# Patient Record
Sex: Female | Born: 1935
Health system: Southern US, Community
[De-identification: ages and names within clinical notes are randomized; demographics above are authoritative.]

## PROBLEM LIST (undated history)

## (undated) DIAGNOSIS — T7840XA Allergy, unspecified, initial encounter: Secondary | ICD-10-CM

## (undated) DIAGNOSIS — M545 Low back pain, unspecified: Secondary | ICD-10-CM

## (undated) DIAGNOSIS — K219 Gastro-esophageal reflux disease without esophagitis: Secondary | ICD-10-CM

## (undated) DIAGNOSIS — G8929 Other chronic pain: Secondary | ICD-10-CM

## (undated) DIAGNOSIS — I10 Essential (primary) hypertension: Secondary | ICD-10-CM

## (undated) DIAGNOSIS — I4891 Unspecified atrial fibrillation: Secondary | ICD-10-CM

## (undated) DIAGNOSIS — J309 Allergic rhinitis, unspecified: Secondary | ICD-10-CM

## (undated) HISTORY — DX: Allergy, unspecified, initial encounter: T78.40XA

## (undated) HISTORY — DX: Essential (primary) hypertension: I10

## (undated) HISTORY — PX: EYE SURGERY: SHX253

---

## 2011-11-12 DIAGNOSIS — J019 Acute sinusitis, unspecified: Secondary | ICD-10-CM | POA: Diagnosis not present

## 2011-11-12 DIAGNOSIS — J029 Acute pharyngitis, unspecified: Secondary | ICD-10-CM | POA: Diagnosis not present

## 2011-11-14 DIAGNOSIS — J449 Chronic obstructive pulmonary disease, unspecified: Secondary | ICD-10-CM | POA: Diagnosis not present

## 2012-07-11 DIAGNOSIS — H903 Sensorineural hearing loss, bilateral: Secondary | ICD-10-CM | POA: Diagnosis not present

## 2012-09-03 DIAGNOSIS — J449 Chronic obstructive pulmonary disease, unspecified: Secondary | ICD-10-CM | POA: Diagnosis not present

## 2012-09-03 DIAGNOSIS — R079 Chest pain, unspecified: Secondary | ICD-10-CM | POA: Diagnosis not present

## 2012-09-18 DIAGNOSIS — J310 Chronic rhinitis: Secondary | ICD-10-CM | POA: Diagnosis not present

## 2012-09-18 DIAGNOSIS — R05 Cough: Secondary | ICD-10-CM | POA: Diagnosis not present

## 2012-09-18 DIAGNOSIS — S20219A Contusion of unspecified front wall of thorax, initial encounter: Secondary | ICD-10-CM | POA: Diagnosis not present

## 2012-09-18 DIAGNOSIS — J8410 Pulmonary fibrosis, unspecified: Secondary | ICD-10-CM | POA: Diagnosis not present

## 2012-09-19 DIAGNOSIS — I10 Essential (primary) hypertension: Secondary | ICD-10-CM | POA: Diagnosis not present

## 2012-09-29 DIAGNOSIS — J8410 Pulmonary fibrosis, unspecified: Secondary | ICD-10-CM | POA: Diagnosis not present

## 2012-09-29 DIAGNOSIS — I10 Essential (primary) hypertension: Secondary | ICD-10-CM | POA: Diagnosis not present

## 2013-01-26 DIAGNOSIS — I10 Essential (primary) hypertension: Secondary | ICD-10-CM | POA: Diagnosis not present

## 2013-01-26 DIAGNOSIS — J8410 Pulmonary fibrosis, unspecified: Secondary | ICD-10-CM | POA: Diagnosis not present

## 2013-01-26 DIAGNOSIS — J310 Chronic rhinitis: Secondary | ICD-10-CM | POA: Diagnosis not present

## 2013-02-21 ENCOUNTER — Ambulatory Visit: Payer: Self-pay | Admitting: Internal Medicine

## 2013-02-21 DIAGNOSIS — I82819 Embolism and thrombosis of superficial veins of unspecified lower extremities: Secondary | ICD-10-CM | POA: Diagnosis not present

## 2013-02-21 DIAGNOSIS — M7989 Other specified soft tissue disorders: Secondary | ICD-10-CM | POA: Diagnosis not present

## 2013-02-21 DIAGNOSIS — I10 Essential (primary) hypertension: Secondary | ICD-10-CM | POA: Diagnosis not present

## 2013-02-21 DIAGNOSIS — M79609 Pain in unspecified limb: Secondary | ICD-10-CM | POA: Diagnosis not present

## 2013-02-21 DIAGNOSIS — I824Z9 Acute embolism and thrombosis of unspecified deep veins of unspecified distal lower extremity: Secondary | ICD-10-CM | POA: Diagnosis not present

## 2013-03-06 DIAGNOSIS — I809 Phlebitis and thrombophlebitis of unspecified site: Secondary | ICD-10-CM | POA: Diagnosis not present

## 2013-03-06 DIAGNOSIS — S9000XA Contusion of unspecified ankle, initial encounter: Secondary | ICD-10-CM | POA: Diagnosis not present

## 2013-03-06 DIAGNOSIS — I10 Essential (primary) hypertension: Secondary | ICD-10-CM | POA: Diagnosis not present

## 2013-03-06 DIAGNOSIS — Z0000 Encounter for general adult medical examination without abnormal findings: Secondary | ICD-10-CM | POA: Diagnosis not present

## 2013-04-20 DIAGNOSIS — Z01419 Encounter for gynecological examination (general) (routine) without abnormal findings: Secondary | ICD-10-CM | POA: Diagnosis not present

## 2013-04-20 DIAGNOSIS — Z23 Encounter for immunization: Secondary | ICD-10-CM | POA: Diagnosis not present

## 2013-04-20 DIAGNOSIS — Z0000 Encounter for general adult medical examination without abnormal findings: Secondary | ICD-10-CM | POA: Diagnosis not present

## 2013-04-20 DIAGNOSIS — Z9109 Other allergy status, other than to drugs and biological substances: Secondary | ICD-10-CM | POA: Diagnosis not present

## 2013-04-30 DIAGNOSIS — Z0000 Encounter for general adult medical examination without abnormal findings: Secondary | ICD-10-CM | POA: Diagnosis not present

## 2013-11-13 DIAGNOSIS — J018 Other acute sinusitis: Secondary | ICD-10-CM | POA: Diagnosis not present

## 2013-12-04 ENCOUNTER — Emergency Department: Payer: Self-pay | Admitting: Emergency Medicine

## 2013-12-04 DIAGNOSIS — S40012A Contusion of left shoulder, initial encounter: Secondary | ICD-10-CM | POA: Diagnosis not present

## 2013-12-04 DIAGNOSIS — S79912A Unspecified injury of left hip, initial encounter: Secondary | ICD-10-CM | POA: Diagnosis not present

## 2013-12-04 DIAGNOSIS — J189 Pneumonia, unspecified organism: Secondary | ICD-10-CM | POA: Diagnosis not present

## 2013-12-04 DIAGNOSIS — M25552 Pain in left hip: Secondary | ICD-10-CM | POA: Diagnosis not present

## 2013-12-04 DIAGNOSIS — R05 Cough: Secondary | ICD-10-CM | POA: Diagnosis not present

## 2013-12-04 DIAGNOSIS — R918 Other nonspecific abnormal finding of lung field: Secondary | ICD-10-CM | POA: Diagnosis not present

## 2013-12-04 DIAGNOSIS — S4992XA Unspecified injury of left shoulder and upper arm, initial encounter: Secondary | ICD-10-CM | POA: Diagnosis not present

## 2013-12-04 DIAGNOSIS — Z880 Allergy status to penicillin: Secondary | ICD-10-CM | POA: Diagnosis not present

## 2013-12-04 DIAGNOSIS — I10 Essential (primary) hypertension: Secondary | ICD-10-CM | POA: Diagnosis not present

## 2013-12-04 DIAGNOSIS — M25512 Pain in left shoulder: Secondary | ICD-10-CM | POA: Diagnosis not present

## 2013-12-04 DIAGNOSIS — S7002XA Contusion of left hip, initial encounter: Secondary | ICD-10-CM | POA: Diagnosis not present

## 2013-12-14 ENCOUNTER — Ambulatory Visit: Payer: Self-pay | Admitting: Family Medicine

## 2013-12-14 DIAGNOSIS — Z9109 Other allergy status, other than to drugs and biological substances: Secondary | ICD-10-CM | POA: Diagnosis not present

## 2013-12-14 DIAGNOSIS — M25552 Pain in left hip: Secondary | ICD-10-CM | POA: Diagnosis not present

## 2013-12-14 DIAGNOSIS — I10 Essential (primary) hypertension: Secondary | ICD-10-CM | POA: Diagnosis not present

## 2013-12-14 DIAGNOSIS — J189 Pneumonia, unspecified organism: Secondary | ICD-10-CM | POA: Diagnosis not present

## 2013-12-14 DIAGNOSIS — J159 Unspecified bacterial pneumonia: Secondary | ICD-10-CM | POA: Diagnosis not present

## 2013-12-14 DIAGNOSIS — R399 Unspecified symptoms and signs involving the genitourinary system: Secondary | ICD-10-CM | POA: Diagnosis not present

## 2013-12-15 ENCOUNTER — Ambulatory Visit: Payer: Self-pay | Admitting: Family Medicine

## 2013-12-15 DIAGNOSIS — J159 Unspecified bacterial pneumonia: Secondary | ICD-10-CM | POA: Diagnosis not present

## 2013-12-15 DIAGNOSIS — R05 Cough: Secondary | ICD-10-CM | POA: Diagnosis not present

## 2013-12-15 DIAGNOSIS — J189 Pneumonia, unspecified organism: Secondary | ICD-10-CM | POA: Diagnosis not present

## 2013-12-30 ENCOUNTER — Ambulatory Visit: Payer: Self-pay | Admitting: Family Medicine

## 2013-12-30 DIAGNOSIS — J189 Pneumonia, unspecified organism: Secondary | ICD-10-CM | POA: Diagnosis not present

## 2013-12-30 DIAGNOSIS — Z9181 History of falling: Secondary | ICD-10-CM | POA: Diagnosis not present

## 2013-12-30 DIAGNOSIS — Z1389 Encounter for screening for other disorder: Secondary | ICD-10-CM | POA: Diagnosis not present

## 2013-12-30 DIAGNOSIS — J984 Other disorders of lung: Secondary | ICD-10-CM | POA: Diagnosis not present

## 2013-12-30 DIAGNOSIS — M25552 Pain in left hip: Secondary | ICD-10-CM | POA: Diagnosis not present

## 2013-12-30 DIAGNOSIS — R918 Other nonspecific abnormal finding of lung field: Secondary | ICD-10-CM | POA: Diagnosis not present

## 2013-12-31 ENCOUNTER — Ambulatory Visit: Payer: Self-pay | Admitting: Family Medicine

## 2013-12-31 DIAGNOSIS — J984 Other disorders of lung: Secondary | ICD-10-CM | POA: Diagnosis not present

## 2013-12-31 DIAGNOSIS — K8020 Calculus of gallbladder without cholecystitis without obstruction: Secondary | ICD-10-CM | POA: Diagnosis not present

## 2013-12-31 DIAGNOSIS — I2510 Atherosclerotic heart disease of native coronary artery without angina pectoris: Secondary | ICD-10-CM | POA: Diagnosis not present

## 2014-05-25 DIAGNOSIS — J302 Other seasonal allergic rhinitis: Secondary | ICD-10-CM | POA: Diagnosis not present

## 2014-05-25 DIAGNOSIS — J209 Acute bronchitis, unspecified: Secondary | ICD-10-CM | POA: Diagnosis not present

## 2014-06-04 ENCOUNTER — Ambulatory Visit (INDEPENDENT_AMBULATORY_CARE_PROVIDER_SITE_OTHER): Payer: Medicare Other | Admitting: Family Medicine

## 2014-06-04 ENCOUNTER — Ambulatory Visit
Admission: RE | Admit: 2014-06-04 | Discharge: 2014-06-04 | Disposition: A | Discharge status: Home / Self Care | Payer: Medicare Other | Source: Ambulatory Visit | Attending: Family Medicine | Admitting: Family Medicine

## 2014-06-04 ENCOUNTER — Encounter: Payer: Self-pay | Admitting: Family Medicine

## 2014-06-04 ENCOUNTER — Other Ambulatory Visit: Payer: Self-pay | Admitting: Family Medicine

## 2014-06-04 VITALS — BP 140/74 | HR 65 | Temp 97.9°F | Resp 16 | Ht 67.0 in | Wt 127.2 lb

## 2014-06-04 DIAGNOSIS — R0602 Shortness of breath: Secondary | ICD-10-CM

## 2014-06-04 DIAGNOSIS — R05 Cough: Secondary | ICD-10-CM | POA: Insufficient documentation

## 2014-06-04 DIAGNOSIS — J209 Acute bronchitis, unspecified: Secondary | ICD-10-CM

## 2014-06-04 MED ORDER — LEVOFLOXACIN 500 MG PO TABS: 500.0000 mg | ORAL_TABLET | Freq: Every day | ORAL | Status: DC

## 2014-06-04 MED ORDER — PREDNISONE 20 MG PO TABS: 20.0000 mg | ORAL_TABLET | Freq: Every day | ORAL | Status: AC

## 2014-06-04 NOTE — Progress Notes (Signed)
This encounter was created in error - please disregard. This encounter was created in error - please disregard. This encounter was created in error - please disregard. 

## 2014-06-04 NOTE — Patient Instructions (Addendum)
Let's try a short course of steroids and levaquin for your symptoms. Your chest X-ray shows no pneumonia.   Please seek immediate medical attention if you develop shortness of breath not relieve by inhaler, chest pain/tightness, fever > 103 F or other concerning symptoms.  Acute Bronchitis Bronchitis is inflammation of the airways that extend from the windpipe into the lungs (bronchi). The inflammation often causes mucus to develop. This leads to a cough, which is the most common symptom of bronchitis.  In acute bronchitis, the condition usually develops suddenly and goes away over time, usually in a couple weeks. Smoking, allergies, and asthma can make bronchitis worse. Repeated episodes of bronchitis may cause further lung problems.  CAUSES Acute bronchitis is most often caused by the same virus that causes a cold. The virus can spread from person to person (contagious) through coughing, sneezing, and touching contaminated objects. SIGNS AND SYMPTOMS   Cough.   Fever.   Coughing up mucus.   Body aches.   Chest congestion.   Chills.   Shortness of breath.   Sore throat.  DIAGNOSIS  Acute bronchitis is usually diagnosed through a physical exam. Your health care provider will also ask you questions about your medical history. Tests, such as chest X-rays, are sometimes done to rule out other conditions.  TREATMENT  Acute bronchitis usually goes away in a couple weeks. Oftentimes, no medical treatment is necessary. Medicines are sometimes given for relief of fever or cough. Antibiotic medicines are usually not needed but may be prescribed in certain situations. In some cases, an inhaler may be recommended to help reduce shortness of breath and control the cough. A cool mist vaporizer may also be used to help thin bronchial secretions and make it easier to clear the chest.  HOME CARE INSTRUCTIONS  Get plenty of rest.   Drink enough fluids to keep your urine clear or pale yellow  (unless you have a medical condition that requires fluid restriction). Increasing fluids may help thin your respiratory secretions (sputum) and reduce chest congestion, and it will prevent dehydration.   Take medicines only as directed by your health care provider.  If you were prescribed an antibiotic medicine, finish it all even if you start to feel better.  Avoid smoking and secondhand smoke. Exposure to cigarette smoke or irritating chemicals will make bronchitis worse. If you are a smoker, consider using nicotine gum or skin patches to help control withdrawal symptoms. Quitting smoking will help your lungs heal faster.   Reduce the chances of another bout of acute bronchitis by washing your hands frequently, avoiding people with cold symptoms, and trying not to touch your hands to your mouth, nose, or eyes.   Keep all follow-up visits as directed by your health care provider.  SEEK MEDICAL CARE IF: Your symptoms do not improve after 1 week of treatment.  SEEK IMMEDIATE MEDICAL CARE IF:  You develop an increased fever or chills.   You have chest pain.   You have severe shortness of breath.  You have bloody sputum.   You develop dehydration.  You faint or repeatedly feel like you are going to pass out.  You develop repeated vomiting.  You develop a severe headache. MAKE SURE YOU:   Understand these instructions.  Will watch your condition.  Will get help right away if you are not doing well or get worse. Document Released: 01/26/2004 Document Revised: 05/04/2013 Document Reviewed: 06/10/2012 [facility] Patient Information 2015 [place], [place]. This information is not intended to replace  advice given to you by your health care provider. Make sure you discuss any questions you have with your health care provider.  

## 2014-06-04 NOTE — Progress Notes (Signed)
Subjective:    Patient ID: Leslie Johnston, female    DOB: 16-May-1925, 81 y.o.   MRN: [id number]  HPI: Leslie Johnston is a 81 y.o. female presenting on 06/04/2014 for Cough   Cough This is a recurrent problem. The current episode started 1 to 4 weeks ago. The problem has been gradually improving. The problem occurs every few hours. The cough is productive of sputum. Associated symptoms include nasal congestion, postnasal drip, shortness of breath and wheezing. Pertinent negatives include no chest pain, chills, fever, headaches or sore throat. The symptoms are aggravated by lying down and exercise. She has tried a beta-agonist inhaler for the symptoms. The treatment provided mild relief. Her past medical history is significant for bronchitis and pneumonia.  Pt was seen last week and treated with a Z-pak. Symptoms have failed to improved. She endorses thick green sputum production and audible wheezing. She is using ventolin inhaler 4 times daily with little relief.   Past Medical History  Diagnosis Date  . Allergy   . Hypertension     No current outpatient prescriptions on file prior to visit.   No current facility-administered medications on file prior to visit.    Review of Systems  Constitutional: Negative for fever and chills.  HENT: Positive for postnasal drip and sinus pressure. Negative for congestion and sore throat.   Eyes: Negative.   Respiratory: Positive for cough (with sputum production.), shortness of breath and wheezing.   Cardiovascular: Negative for chest pain, palpitations and leg swelling.  Genitourinary: Negative.   Musculoskeletal: Negative.   Neurological: Negative.  Negative for dizziness, light-headedness and headaches.   Per HPI unless specifically indicated above     Objective:    BP 140/74 mmHg  Pulse 65  Temp(Src) 97.9 F (36.6 C) (Oral)  Resp 16  Ht 5\' 7"  (1.702 m)  Wt 127 lb 3.2 oz (57.698 kg)  BMI 19.92 kg/m2  SpO2 96%  Wt Readings from  Last 3 Encounters:  06/04/14 127 lb 3.2 oz (57.698 kg)    Physical Exam  Constitutional: She appears well-developed and well-nourished. No distress.  HENT:  Head: Normocephalic.  Right Ear: Tympanic membrane is not erythematous and not bulging.  Left Ear: Tympanic membrane is not erythematous and not bulging.  Nose: Rhinorrhea present. No mucosal edema or sinus tenderness. Right sinus exhibits no maxillary sinus tenderness and no frontal sinus tenderness. Left sinus exhibits no maxillary sinus tenderness and no frontal sinus tenderness.  Mouth/Throat: Uvula is midline and mucous membranes are normal. No uvula swelling. No posterior oropharyngeal edema or posterior oropharyngeal erythema.  Neck: Neck supple. No Brudzinski's sign and no Kernig's sign noted.  Cardiovascular: Normal rate, regular rhythm, S1 normal, S2 normal and normal heart sounds.  Exam reveals no gallop.   No murmur heard. Pulmonary/Chest: No accessory muscle usage. No tachypnea. No respiratory distress. She has wheezes in the right upper field, the right lower field, the left upper field and the left lower field. She has no rhonchi. She has no rales.  Lymphadenopathy:    She has cervical adenopathy.   No results found for this or any previous visit.    Assessment & Plan:   Problem List Items Addressed This Visit    None    Visit Diagnoses    Acute bronchitis, unspecified organism    -  Primary    Failed to improve with previous treatment. Start low dose steroids x 5 days. Trial of levaquin.    Relevant Medications  levofloxacin (LEVAQUIN) 500 MG tablet    predniSONE (DELTASONE) 20 MG tablet    Shortness of breath at rest        Continue inhalers as needed.       Meds ordered this encounter  Medications  . VENTOLIN HFA 108 (90 BASE) MCG/ACT inhaler    Sig:     Refill:  10  . fluticasone (FLONASE) 50 MCG/ACT nasal spray    Sig:     Refill:  11  . aspirin 81 MG tablet    Sig: Take 81 mg by mouth daily.  Marland Kitchen  loratadine (CLARITIN) 10 MG tablet    Sig: Take 10 mg by mouth daily.  Marland Kitchen levofloxacin (LEVAQUIN) 500 MG tablet    Sig: Take 1 tablet (500 mg total) by mouth daily.    Dispense:  7 tablet    Refill:  0    Order Specific Question:  Supervising Provider    Answer:  Arlis Porta [[id number]]  . predniSONE (DELTASONE) 20 MG tablet    Sig: Take 1 tablet (20 mg total) by mouth daily with breakfast.    Dispense:  5 tablet    Refill:  0    Order Specific Question:  Supervising Provider    Answer:  Arlis Porta [[id number]]      Follow up plan: Return if symptoms worsen or fail to improve.

## 2014-07-27 ENCOUNTER — Encounter: Payer: Self-pay | Admitting: Family Medicine

## 2014-07-27 ENCOUNTER — Ambulatory Visit (INDEPENDENT_AMBULATORY_CARE_PROVIDER_SITE_OTHER): Payer: Medicare Other | Admitting: Family Medicine

## 2014-07-27 VITALS — BP 148/84 | HR 64 | Temp 98.2°F | Resp 16 | Ht 67.0 in | Wt 127.0 lb

## 2014-07-27 DIAGNOSIS — I129 Hypertensive chronic kidney disease with stage 1 through stage 4 chronic kidney disease, or unspecified chronic kidney disease: Secondary | ICD-10-CM | POA: Insufficient documentation

## 2014-07-27 DIAGNOSIS — N183 Chronic kidney disease, stage 3 unspecified: Secondary | ICD-10-CM | POA: Insufficient documentation

## 2014-07-27 DIAGNOSIS — Z0000 Encounter for general adult medical examination without abnormal findings: Secondary | ICD-10-CM

## 2014-07-27 DIAGNOSIS — I10 Essential (primary) hypertension: Secondary | ICD-10-CM | POA: Insufficient documentation

## 2014-07-27 NOTE — Patient Instructions (Signed)
Overall doing well. Does not want to screen for osteoporosis, breast cancer or colon cancer.   Would like some help from family around the house doing dishes. :)

## 2014-07-27 NOTE — Progress Notes (Signed)
Subjective:    Leslie Johnston is a 81 y.o. female who presents for Medicare Annual/Subsequent preventive examination.  Preventive Screening-Counseling & Management  Tobacco History  Smoking status  . Never Smoker   Smokeless tobacco  . Not on file     Problems Prior to Visit 1.   Current Problems (verified) Patient Active Problem List   Diagnosis Date Noted  . HTN (hypertension) 07/27/2014    Medications Prior to Visit Current Outpatient Prescriptions on File Prior to Visit  Medication Sig Dispense Refill  . aspirin 81 MG tablet Take 81 mg by mouth daily.    . fluticasone (FLONASE) 50 MCG/ACT nasal spray   11  . loratadine (CLARITIN) 10 MG tablet Take 10 mg by mouth daily.    . VENTOLIN HFA 108 (90 BASE) MCG/ACT inhaler   10   No current facility-administered medications on file prior to visit.    Current Medications (verified) Current Outpatient Prescriptions  Medication Sig Dispense Refill  . aspirin 81 MG tablet Take 81 mg by mouth daily.    . fluticasone (FLONASE) 50 MCG/ACT nasal spray   11  . loratadine (CLARITIN) 10 MG tablet Take 10 mg by mouth daily.    . VENTOLIN HFA 108 (90 BASE) MCG/ACT inhaler   10   No current facility-administered medications for this visit.     Allergies (verified) Amlodipine; Losartan; and Penicillins   PAST HISTORY  Family History Family History  Problem Relation Age of Onset  . Cancer Sister     pancreatic CA at 71    Social History History  Substance Use Topics  . Smoking status: Never Smoker   . Smokeless tobacco: Not on file  . Alcohol Use: Not on file     Are there smokers in your home (other than you)? No  Risk Factors Current exercise habits: Home exercise routine includes walking 15 minutes per day.  Dietary issues discussed: None.    Cardiac risk factors: advanced age (older than 88 for men, 64 for women) and hypertension.  Depression Screen (Note: if answer to either of the following is  "Yes", a more complete depression screening is indicated)   Over the past two weeks, have you felt down, depressed or hopeless? No  Over the past two weeks, have you felt little interest or pleasure in doing things? No  Have you lost interest or pleasure in daily life? No  Do you often feel hopeless? No  Do you cry easily over simple problems? No  Activities of Daily Living In your present state of health, do you have any difficulty performing the following activities?:  Driving? No Managing money?  No Feeding yourself? No Getting from bed to chair? No Climbing a flight of stairs? NO Preparing food and eating?: NO Bathing or showering? NO Getting dressed:NO Getting to the toilet?NO Using the toilet:NO Moving around from place to place: NO In the past year have you fallen or had a near fall? Yes  Are you sexually active?  NO  Do you have more than one partner?NO  Hearing Difficulties: Yes- pt has bilateral hearing aids.  Do you often ask people to speak up or repeat themselves? Yes without hearing aid.  Do you experience ringing or noises in your ears?NO Do you have difficulty understanding soft or whispered voices?NO   Do you feel that you have a problem with memory? NO  Do you often misplace items?NO  Do you feel safe at home? NO  Cognitive Testing  Alert? Yes  Normal Appearance? Yes  Oriented to person? Yes Place? Yes Time? Yes  Recall of three objects? Yes  Can perform simple calculations?Yes  Displays appropriate judgment?Yes  Can read the correct time from a watch face? Yes   Advanced Directives have been discussed with the patient? Yes MMSE - Mini Mental State Exam 07/27/2014  Orientation to time 5  Orientation to Place 5  Registration 3  Attention/ Calculation 5  Recall 3  Language- name 2 objects 2  Language- repeat 1  Language- follow 3 step command 3  Language- read & follow direction 1  Write a sentence 1  Copy design 1  Total score 30      List  the Names of Other Physician/Practitioners you currently use: 1.  Fredia Sorrow, FNP  Indicate any recent Medical Services you may have received from other than [facility] providers in the past year (date may be approximate).  Immunization History  Administered Date(s) Administered  . Pneumococcal Conjugate-13 04/25/2013    Screening Tests Health Maintenance  Topic Date Due  . PNA vac Low Risk Adult (2 of 2 - PPSV23) 04/26/2014  . INFLUENZA VACCINE  08/02/2014  . TETANUS/TDAP  01/01/2021  . COLONOSCOPY  07/26/2024  . DEXA SCAN  Completed  . ZOSTAVAX  Completed     History reviewed: allergies, current medications, past medical history, past social history, past surgical history and problem list  Review of Systems A comprehensive review of systems was negative.    Objective:     Vision by Snellen chart: right eye:20/40, left eye:pt is blind in L eye  Body mass index is 19.89 kg/(m^2). BP 148/84 mmHg  Pulse 64  Temp(Src) 98.2 F (36.8 C) (Oral)  Resp 16  Ht 5\' 7"  (1.702 m)  Wt 127 lb (57.607 kg)  BMI 19.89 kg/m2       Assessment:     Medicare Wellness visit      Plan:     During the course of the visit the patient was educated and counseled about appropriate screening and preventive services including:    Screening mammography  Nutrition counseling   Advanced directives: has NO advanced directive  - add't info requested. Referral to SW: no  Diet review for nutrition referral? Yes ____  Not Indicated ____   Patient Instructions (the written plan) was given to the patient.  Medicare Attestation I have personally reviewed: The patient's medical and social history Their use of alcohol, tobacco or illicit drugs Their current medications and supplements The patient's functional ability including ADLs,fall risks, home safety risks, cognitive, and hearing and visual impairment Diet and physical activities Evidence for depression or mood disorders  The patient's  weight, height, BMI, and visual acuity have been recorded in the chart.  I have made referrals, counseling, and provided education to the patient based on review of the above and I have provided the patient with a written personalized care plan for preventive services.     Ilyse Tremain L Jullia Mulligan, NP   07/27/2014

## 2014-11-16 ENCOUNTER — Encounter: Payer: Self-pay | Admitting: Family Medicine

## 2014-11-16 ENCOUNTER — Ambulatory Visit (INDEPENDENT_AMBULATORY_CARE_PROVIDER_SITE_OTHER): Payer: Medicare Other | Admitting: Family Medicine

## 2014-11-16 VITALS — BP 134/82 | HR 92 | Temp 98.2°F | Resp 16 | Ht 67.0 in | Wt 123.0 lb

## 2014-11-16 DIAGNOSIS — J069 Acute upper respiratory infection, unspecified: Secondary | ICD-10-CM | POA: Diagnosis not present

## 2014-11-16 DIAGNOSIS — J0110 Acute frontal sinusitis, unspecified: Secondary | ICD-10-CM

## 2014-11-16 MED ORDER — DOXYCYCLINE HYCLATE 100 MG PO TABS
100.0000 mg | ORAL_TABLET | Freq: Two times a day (BID) | ORAL | Status: DC
Start: 2014-11-16 — End: 2015-01-07

## 2014-11-16 MED ORDER — DM-GUAIFENESIN ER 30-600 MG PO TB12: 1.0000 | ORAL_TABLET | Freq: Two times a day (BID) | ORAL | Status: DC

## 2014-11-16 MED ORDER — OXYMETAZOLINE HCL 0.05 % NA SOLN: 1.0000 | Freq: Two times a day (BID) | NASAL | Status: DC

## 2014-11-16 NOTE — Progress Notes (Signed)
Subjective:    Patient ID: Leslie Johnston, female    DOB: 19-Apr-1925, 81 y.o.   MRN: [id number]  HPI: Leslie Johnston is a 81 y.o. female presenting on 11/16/2014 for Cough   HPI  Pt presents cough and congestion.  She is reporting nasal drainage and ear congestion.  Symptoms have been present for about 5 days. Home treatment: Aspirin and flonase.  She is also taking claritin OTC for allergies. Took advil this morning. Pt is reporting R sided facial pain. Unsure if she had a fever at home.    Past Medical History  Diagnosis Date  . Allergy   . Hypertension     Current Outpatient Prescriptions on File Prior to Visit  Medication Sig  . aspirin 81 MG tablet Take 81 mg by mouth daily.  . fluticasone (FLONASE) 50 MCG/ACT nasal spray   . loratadine (CLARITIN) 10 MG tablet Take 10 mg by mouth daily.  . VENTOLIN HFA 108 (90 BASE) MCG/ACT inhaler    No current facility-administered medications on file prior to visit.    Review of Systems  Constitutional: Positive for chills. Negative for fever.  HENT: Positive for congestion and sinus pressure. Negative for ear pain, sneezing and sore throat.   Respiratory: Negative for cough, chest tightness and wheezing.   Cardiovascular: Negative for chest pain and palpitations.  Gastrointestinal: Negative.  Negative for nausea, vomiting and diarrhea.  Musculoskeletal: Negative for neck pain and neck stiffness.  Neurological: Positive for headaches.   Per HPI unless specifically indicated above     Objective:    BP 134/82 mmHg  Pulse 92  Temp(Src) 98.2 F (36.8 C) (Oral)  Resp 16  Ht 5\' 7"  (1.702 m)  Wt 123 lb (55.792 kg)  BMI 19.26 kg/m2  SpO2 96%  Wt Readings from Last 3 Encounters:  11/16/14 123 lb (55.792 kg)  07/27/14 127 lb (57.607 kg)  06/04/14 127 lb 3.2 oz (57.698 kg)    Physical Exam  Constitutional: She appears well-developed and well-nourished. No distress.  HENT:  Head: Normocephalic.  Right Ear: Hearing  normal. Tympanic membrane is retracted. Tympanic membrane is not erythematous and not bulging.  Left Ear: Hearing normal. Tympanic membrane is not erythematous and not bulging.  Nose: Mucosal edema and rhinorrhea present. No sinus tenderness or nasal septal hematoma. Right sinus exhibits frontal sinus tenderness. Right sinus exhibits no maxillary sinus tenderness. Left sinus exhibits no maxillary sinus tenderness and no frontal sinus tenderness.  Mouth/Throat: Uvula is midline and mucous membranes are normal. No uvula swelling. Posterior oropharyngeal erythema present. No posterior oropharyngeal edema.  Neck: Neck supple. No Brudzinski's sign and no Kernig's sign noted.  Cardiovascular: Normal rate, regular rhythm and normal heart sounds.   Pulmonary/Chest: Breath sounds normal. No accessory muscle usage. No tachypnea. No respiratory distress.  Lymphadenopathy:    She has no cervical adenopathy.   No results found for this or any previous visit.    Assessment & Plan:   Problem List Items Addressed This Visit    None    Visit Diagnoses    Acute frontal sinusitis, recurrence not specified    -  Primary    Treat with doxycycline for sinus infection. Saline and spray and sparing use of Afrin for ear congestion. RTC if symptoms not improved.     Relevant Medications    doxycycline (VIBRA-TABS) 100 MG tablet    dextromethorphan-guaiFENesin (MUCINEX DM) 30-600 MG 12hr tablet    oxymetazoline (AFRIN NASAL SPRAY) 0.05 % nasal spray  Upper respiratory infection        Supportive care at home. Ventolin as needed for chest tightness. Mucinex Dm for cough and congestion. Alarm symptoms reviewed. RTC if symptoms not improved.     Relevant Medications    dextromethorphan-guaiFENesin (MUCINEX DM) 30-600 MG 12hr tablet       Meds ordered this encounter  Medications  . doxycycline (VIBRA-TABS) 100 MG tablet    Sig: Take 1 tablet (100 mg total) by mouth 2 (two) times daily.    Dispense:  14 tablet      Refill:  0    Order Specific Question:  Supervising Provider    Answer:  Arlis Porta [phone]  . dextromethorphan-guaiFENesin (MUCINEX DM) 30-600 MG 12hr tablet    Sig: Take 1 tablet by mouth 2 (two) times daily.    Dispense:  10 tablet    Refill:  0    Order Specific Question:  Supervising Provider    Answer:  Arlis Porta [phone]  . oxymetazoline (AFRIN NASAL SPRAY) 0.05 % nasal spray    Sig: Place 1 spray into both nostrils 2 (two) times daily.    Dispense:  30 mL    Refill:  0    Order Specific Question:  Supervising Provider    Answer:  Arlis Porta [id number]      Follow up plan: No Follow-up on file.

## 2014-11-16 NOTE — Patient Instructions (Signed)
We are treating you for a sinus infection today.  Take your antibiotic twice daily for 7 days. You can use Afrin to help with congestion twice daily for 3 days only.  You can also use saline spray as needed.  Continue the ventolin inhaler as needed for chest tightness. Use mucinex to help break up congestion. Continue your flonase and claritin.   Please seek immediate medical attention if you develop shortness of breath not relieve by inhaler, chest pain/tightness, fever > 103 F or other concerning symptoms.

## 2015-01-07 ENCOUNTER — Ambulatory Visit
Admission: RE | Admit: 2015-01-07 | Discharge: 2015-01-07 | Disposition: A | Discharge status: Home / Self Care | Payer: Medicare Other | Source: Ambulatory Visit | Attending: Family Medicine | Admitting: Family Medicine

## 2015-01-07 ENCOUNTER — Ambulatory Visit (INDEPENDENT_AMBULATORY_CARE_PROVIDER_SITE_OTHER): Payer: Medicare Other | Admitting: Family Medicine

## 2015-01-07 VITALS — BP 144/76 | HR 109 | Temp 98.9°F | Resp 16 | Ht 67.0 in | Wt 124.0 lb

## 2015-01-07 DIAGNOSIS — Z8701 Personal history of pneumonia (recurrent): Secondary | ICD-10-CM

## 2015-01-07 DIAGNOSIS — J209 Acute bronchitis, unspecified: Secondary | ICD-10-CM | POA: Diagnosis not present

## 2015-01-07 DIAGNOSIS — J449 Chronic obstructive pulmonary disease, unspecified: Secondary | ICD-10-CM | POA: Diagnosis not present

## 2015-01-07 DIAGNOSIS — J069 Acute upper respiratory infection, unspecified: Secondary | ICD-10-CM | POA: Diagnosis not present

## 2015-01-07 DIAGNOSIS — R05 Cough: Secondary | ICD-10-CM | POA: Diagnosis not present

## 2015-01-07 MED ORDER — BENZONATATE 100 MG PO CAPS: 100.0000 mg | ORAL_CAPSULE | Freq: Three times a day (TID) | ORAL | Status: DC | PRN

## 2015-01-07 MED ORDER — DOXYCYCLINE HYCLATE 100 MG PO TABS: 100.0000 mg | ORAL_TABLET | Freq: Two times a day (BID) | ORAL | Status: DC

## 2015-01-07 MED ORDER — DM-GUAIFENESIN ER 30-600 MG PO TB12
1.0000 | ORAL_TABLET | Freq: Two times a day (BID) | ORAL | Status: DC
Start: 2015-01-07 — End: 2015-09-19

## 2015-01-07 NOTE — Patient Instructions (Signed)
You can use supportive care at home to help with your symptoms. I have sent Mucinex DM to your pharmacy to help break up the congestion and soothe your cough. You can takes this twice daily.  I have also sent tesslon perles to your pharmacy to help with the cough- you can take these 3 times daily as needed. Honey is a natural cough suppressant- so add it to your tea in the morning.  If you have a humidifer, set that up in your bedroom at night.   Please seek immediate medical attention if you develop shortness of breath not relieve by inhaler, chest pain/tightness, fever > 103 F or other concerning symptoms.   

## 2015-01-07 NOTE — Progress Notes (Signed)
Subjective:    Patient ID: Leslie Johnston, female    DOB: August 31, 1925, 82 y.o.   MRN: [id number]  HPI: Leslie Johnston is a 82 y.o. female presenting on 01/07/2015 for Cough   HPI  Pt presents for cough, cold, congestion. Symptoms began on Monday. Low grade fevers with cough. Productive cough with green phlegm.  Chest tight. Sore throat. Ears hurt.  Congestion pain pressure in her face.  Home treatment: Cordicin HBP at home.   Past Medical History  Diagnosis Date  . Allergy   . Hypertension     Current Outpatient Prescriptions on File Prior to Visit  Medication Sig  . aspirin 81 MG tablet Take 81 mg by mouth daily.  . fluticasone (FLONASE) 50 MCG/ACT nasal spray   . loratadine (CLARITIN) 10 MG tablet Take 10 mg by mouth daily.  Marland Kitchen oxymetazoline (AFRIN NASAL SPRAY) 0.05 % nasal spray Place 1 spray into both nostrils 2 (two) times daily.  . VENTOLIN HFA 108 (90 BASE) MCG/ACT inhaler    No current facility-administered medications on file prior to visit.    Review of Systems  Constitutional: Positive for fever and chills.  HENT: Positive for congestion, ear pain and sinus pressure. Negative for sneezing and sore throat.   Respiratory: Positive for cough and chest tightness. Negative for wheezing.   Cardiovascular: Negative for chest pain and palpitations.  Gastrointestinal: Negative.  Negative for nausea, vomiting and diarrhea.  Musculoskeletal: Negative for neck pain and neck stiffness.  Neurological: Positive for headaches.   Per HPI unless specifically indicated above     Objective:    BP 144/76 mmHg  Pulse 109  Temp(Src) 98.9 F (37.2 C) (Oral)  Resp 16  Ht 5\' 7"  (1.702 m)  Wt 124 lb (56.246 kg)  BMI 19.42 kg/m2  SpO2 94%  Wt Readings from Last 3 Encounters:  01/07/15 124 lb (56.246 kg)  11/16/14 123 lb (55.792 kg)  07/27/14 127 lb (57.607 kg)    Physical Exam  Constitutional: She appears well-developed and well-nourished. No distress.  HENT:  Head:  Normocephalic and atraumatic.  Right Ear: Hearing and tympanic membrane normal. Tympanic membrane is not erythematous and not bulging.  Left Ear: Hearing and tympanic membrane normal. Tympanic membrane is not erythematous and not bulging.  Nose: Mucosal edema and rhinorrhea present. No sinus tenderness. Right sinus exhibits no maxillary sinus tenderness and no frontal sinus tenderness. Left sinus exhibits maxillary sinus tenderness and frontal sinus tenderness.  Mouth/Throat: Uvula is midline and mucous membranes are normal. No uvula swelling. Posterior oropharyngeal erythema present. No posterior oropharyngeal edema.  Neck: Neck supple. No Brudzinski's sign and no Kernig's sign noted.  Cardiovascular: Normal rate, regular rhythm and normal heart sounds.   Pulmonary/Chest: No accessory muscle usage. No tachypnea. No respiratory distress. She has wheezes in the right lower field and the left lower field. She has no rhonchi. She has no rales.  Lymphadenopathy:    She has no cervical adenopathy.   No results found for this or any previous visit.    Assessment & Plan:   Problem List Items Addressed This Visit    None    Visit Diagnoses    History of pneumococcal pneumonia    -  Primary    CXR clear. COPD with no pneumonia.     Relevant Orders    DG Chest 2 View (Completed)    Acute bronchitis, unspecified organism        Treat with Doxy due to patient tendency  to develop pneumonia and coverage for sinuses. Inhaler PRN for chest tightness.     Relevant Medications    doxycycline (VIBRA-TABS) 100 MG tablet    dextromethorphan-guaiFENesin (MUCINEX DM) 30-600 MG 12hr tablet    benzonatate (TESSALON) 100 MG capsule    Upper respiratory infection        Supportive care at home. Ventolin as needed for chest tightness. Mucinex Dm for cough and congestion. Alarm symptoms reviewed. RTC if symptoms not improved.        Meds ordered this encounter  Medications  . doxycycline (VIBRA-TABS) 100 MG  tablet    Sig: Take 1 tablet (100 mg total) by mouth 2 (two) times daily.    Dispense:  14 tablet    Refill:  0    Order Specific Question:  Supervising Provider    Answer:  Arlis Porta [phone]  . dextromethorphan-guaiFENesin (MUCINEX DM) 30-600 MG 12hr tablet    Sig: Take 1 tablet by mouth 2 (two) times daily.    Dispense:  10 tablet    Refill:  0    Order Specific Question:  Supervising Provider    Answer:  Arlis Porta [phone]  . benzonatate (TESSALON) 100 MG capsule    Sig: Take 1 capsule (100 mg total) by mouth 3 (three) times daily as needed for cough.    Dispense:  20 capsule    Refill:  0    Order Specific Question:  Supervising Provider    Answer:  Arlis Porta [id number]      Follow up plan: Return if symptoms worsen or fail to improve.

## 2015-06-19 ENCOUNTER — Other Ambulatory Visit: Payer: Self-pay | Admitting: Family Medicine

## 2015-09-19 ENCOUNTER — Encounter: Payer: Self-pay | Admitting: Family Medicine

## 2015-09-19 ENCOUNTER — Ambulatory Visit (INDEPENDENT_AMBULATORY_CARE_PROVIDER_SITE_OTHER): Payer: Medicare Other | Admitting: Family Medicine

## 2015-09-19 VITALS — BP 140/76 | HR 82 | Temp 98.3°F | Resp 16 | Ht 67.0 in | Wt 126.4 lb

## 2015-09-19 DIAGNOSIS — J209 Acute bronchitis, unspecified: Secondary | ICD-10-CM

## 2015-09-19 DIAGNOSIS — J0100 Acute maxillary sinusitis, unspecified: Secondary | ICD-10-CM

## 2015-09-19 DIAGNOSIS — R9389 Abnormal findings on diagnostic imaging of other specified body structures: Secondary | ICD-10-CM

## 2015-09-19 DIAGNOSIS — R938 Abnormal findings on diagnostic imaging of other specified body structures: Secondary | ICD-10-CM

## 2015-09-19 MED ORDER — DM-GUAIFENESIN ER 30-600 MG PO TB12: 1.0000 | ORAL_TABLET | Freq: Two times a day (BID) | ORAL | 0 refills | Status: DC

## 2015-09-19 MED ORDER — BENZONATATE 100 MG PO CAPS: 100.0000 mg | ORAL_CAPSULE | Freq: Three times a day (TID) | ORAL | 0 refills | Status: DC | PRN

## 2015-09-19 MED ORDER — PREDNISONE 10 MG PO TABS: 30.0000 mg | ORAL_TABLET | Freq: Every day | ORAL | 0 refills | Status: DC

## 2015-09-19 MED ORDER — VENTOLIN HFA 108 (90 BASE) MCG/ACT IN AERS: 2.0000 | INHALATION_SPRAY | Freq: Four times a day (QID) | RESPIRATORY_TRACT | 10 refills | Status: DC | PRN

## 2015-09-19 MED ORDER — DOXYCYCLINE HYCLATE 100 MG PO TABS: 100.0000 mg | ORAL_TABLET | Freq: Two times a day (BID) | ORAL | 0 refills | Status: DC

## 2015-09-19 NOTE — Patient Instructions (Signed)
Please seek immediate medical attention if you develop shortness of breath not relieve by inhaler, chest pain/tightness, fever > 103 F or other concerning symptoms.   You can use supportive care at home to help with your symptoms. I have sent Mucinex DM to your pharmacy to help break up the congestion and soothe your cough. You can takes this twice daily.  I have also sent tesslon perles to your pharmacy to help with the cough- you can take these 3 times daily as needed. Honey is a natural cough suppressant- so add it to your tea in the morning.  If you have a humidifer, set that up in your bedroom at night.    Please let me know if you change your mind about your CT scan. We will be happy to order the follow-up CT for you.

## 2015-09-19 NOTE — Progress Notes (Signed)
Subjective:    Patient ID: Leslie Johnston, female    DOB: May 08, 1925, 82 y.o.   MRN: [id number]  HPI: Leslie Johnston is a 82 y.o. female presenting on 09/19/2015 for Nasal Congestion (onset 1 and half week drainage SOB  runny nose HA facial pressure )   HPI  Pt presents for congestion. Head congestion, now moving to the chest. Symptoms present x 1 week. Nasal congestion. No chest tightness. Coughing up clear sputum.  No pleuritic pain. Sinus headaches and facial pressure R >L.  Of note- pt never had a follow-up CT scan for possible lung nodules. She has decided not to get the CT scan last year in the spring. Have discussed the risks and benefits with patient for follow-up. She has said no at this time but will discuss with her family and will call if she changes her mind.    Past Medical History:  Diagnosis Date  . Allergy   . Hypertension     Current Outpatient Prescriptions on File Prior to Visit  Medication Sig  . aspirin 81 MG tablet Take 81 mg by mouth daily.  . fluticasone (FLONASE) 50 MCG/ACT nasal spray SHAKE WELL AND USE 2 SPRAYS IN EACH NOSTRIL DAILY  . loratadine (CLARITIN) 10 MG tablet Take 10 mg by mouth daily.  Marland Kitchen oxymetazoline (AFRIN NASAL SPRAY) 0.05 % nasal spray Place 1 spray into both nostrils 2 (two) times daily.   No current facility-administered medications on file prior to visit.     Review of Systems  Constitutional: Positive for chills.  HENT: Positive for congestion and sinus pressure. Negative for ear pain, sneezing and sore throat.   Respiratory: Positive for cough and wheezing. Negative for chest tightness and shortness of breath.   Cardiovascular: Negative for chest pain and palpitations.  Gastrointestinal: Negative.  Negative for diarrhea, nausea and vomiting.  Musculoskeletal: Negative for neck pain.  Neurological: Positive for headaches.   Per HPI unless specifically indicated above     Objective:    BP 140/76 (BP Location: Left Arm)    Pulse 82   Temp 98.3 F (36.8 C) (Oral)   Resp 16   Ht 5\' 7"  (1.702 m)   Wt 126 lb 6.4 oz (57.3 kg)   SpO2 96%   BMI 19.80 kg/m   Wt Readings from Last 3 Encounters:  09/19/15 126 lb 6.4 oz (57.3 kg)  01/07/15 124 lb (56.2 kg)  11/16/14 123 lb (55.8 kg)    Physical Exam  Constitutional: She appears well-developed and well-nourished. No distress.  HENT:  Head: Normocephalic.  Right Ear: Hearing and tympanic membrane normal. Tympanic membrane is not erythematous and not bulging.  Left Ear: Hearing and tympanic membrane normal. Tympanic membrane is not erythematous and not bulging.  Nose: Mucosal edema and rhinorrhea present. No sinus tenderness or nasal septal hematoma. Right sinus exhibits maxillary sinus tenderness. Right sinus exhibits no frontal sinus tenderness. Left sinus exhibits maxillary sinus tenderness. Left sinus exhibits no frontal sinus tenderness.  Mouth/Throat: Uvula is midline and mucous membranes are normal. No uvula swelling. Posterior oropharyngeal erythema present. No posterior oropharyngeal edema.  Neck: Neck supple. No Brudzinski's sign and no Kernig's sign noted.  Cardiovascular: Normal rate, regular rhythm and normal heart sounds.   Pulmonary/Chest: No accessory muscle usage. No tachypnea. No respiratory distress. She has no decreased breath sounds. She has wheezes (expiratory.). She has no rhonchi. She has no rales. Chest wall is not dull to percussion. She exhibits no mass and no tenderness.  Lymphadenopathy:    She has no cervical adenopathy.   No results found for this or any previous visit.    Assessment & Plan:   Problem List Items Addressed This Visit      Other   Abnormal chest x-ray    Abnormal CXR and follow-up CT scan in 12/2013- the radiologist had recommended 3 mos follow-up study that patient ultimately decided against. Discussed today to determine if she would like follow-up study. At this time, pt is declining exam- she will discuss with  family and let us know if she changes her mind.        Other Visit Diagnoses    Acute maxillary sinusitis, recurrence not specified    -  Primary   Treat for sinus infection 2/2 facial pain and lingering symptoms. Supportive care at home. Alarm symptoms reviewed. Doxy to cover for both lung and facial pain.   Relevant Medications   dextromethorphan-guaiFENesin (MUCINEX DM) 30-600 MG 12hr tablet   doxycycline (VIBRA-TABS) 100 MG tablet   benzonatate (TESSALON) 100 MG capsule   predniSONE (DELTASONE) 10 MG tablet   Other Relevant Orders   BASIC METABOLIC PANEL WITH GFR   Acute bronchitis, unspecified organism       Treat with Doxy due to patient tendency to develop pneumonia and coverage for sinuses. Inhaler PRN for chest tightness.    Relevant Medications   dextromethorphan-guaiFENesin (MUCINEX DM) 30-600 MG 12hr tablet   doxycycline (VIBRA-TABS) 100 MG tablet   benzonatate (TESSALON) 100 MG capsule   VENTOLIN HFA 108 (90 Base) MCG/ACT inhaler   predniSONE (DELTASONE) 10 MG tablet      Meds ordered this encounter  Medications  . dextromethorphan-guaiFENesin (MUCINEX DM) 30-600 MG 12hr tablet    Sig: Take 1 tablet by mouth 2 (two) times daily.    Dispense:  10 tablet    Refill:  0    Order Specific Question:   Supervising Provider    Answer:   Arlis Porta [phone]  . doxycycline (VIBRA-TABS) 100 MG tablet    Sig: Take 1 tablet (100 mg total) by mouth 2 (two) times daily.    Dispense:  14 tablet    Refill:  0    Order Specific Question:   Supervising Provider    Answer:   Arlis Porta [phone]  . benzonatate (TESSALON) 100 MG capsule    Sig: Take 1 capsule (100 mg total) by mouth 3 (three) times daily as needed for cough.    Dispense:  20 capsule    Refill:  0    Order Specific Question:   Supervising Provider    Answer:   Arlis Porta [id number]  . VENTOLIN HFA 108 (90 Base) MCG/ACT inhaler    Sig: Inhale 2 puffs into the lungs every 6 (six) hours as  needed for wheezing or shortness of breath.    Dispense:  1 Inhaler    Refill:  10    Order Specific Question:   Supervising Provider    Answer:   Arlis Porta [phone]  . predniSONE (DELTASONE) 10 MG tablet    Sig: Take 3 tablets (30 mg total) by mouth daily with breakfast. With breakfast.    Dispense:  15 tablet    Refill:  0    Order Specific Question:   Supervising Provider    Answer:   Arlis Porta [id number]      Follow up plan: Return if symptoms worsen or fail to improve.

## 2015-09-19 NOTE — Assessment & Plan Note (Signed)
Abnormal CXR and follow-up CT scan in 12/2013- the radiologist had recommended 3 mos follow-up study that patient ultimately decided against. Discussed today to determine if she would like follow-up study. At this time, pt is declining exam- she will discuss with family and let us know if she changes her mind.

## 2015-09-20 LAB — BASIC METABOLIC PANEL WITH GFR
BUN: 15 mg/dL (ref 7–25)
CHLORIDE: 103 mmol/L (ref 98–110)
CO2: 26 mmol/L (ref 20–31)
Calcium: 10 mg/dL (ref 8.6–10.4)
Creat: 0.83 mg/dL (ref 0.60–0.88)
GFR, Est African American: 73 mL/min (ref >=60)
GFR, Est Non African American: 64 mL/min (ref >=60)
Glucose, Bld: 82 mg/dL (ref 65–99)
POTASSIUM: 4.5 mmol/L (ref 3.5–5.3)
Sodium: 139 mmol/L (ref 135–146)

## 2016-01-02 DIAGNOSIS — R32 Unspecified urinary incontinence: Secondary | ICD-10-CM | POA: Insufficient documentation

## 2016-03-20 ENCOUNTER — Encounter: Payer: Self-pay | Admitting: Nurse Practitioner

## 2016-03-20 ENCOUNTER — Ambulatory Visit (INDEPENDENT_AMBULATORY_CARE_PROVIDER_SITE_OTHER): Payer: Medicare Other | Admitting: Nurse Practitioner

## 2016-03-20 VITALS — BP 140/70 | HR 90 | Temp 97.8°F | Resp 16 | Ht 67.0 in | Wt 121.0 lb

## 2016-03-20 DIAGNOSIS — B9689 Other specified bacterial agents as the cause of diseases classified elsewhere: Secondary | ICD-10-CM | POA: Diagnosis not present

## 2016-03-20 DIAGNOSIS — J019 Acute sinusitis, unspecified: Secondary | ICD-10-CM

## 2016-03-20 DIAGNOSIS — J302 Other seasonal allergic rhinitis: Secondary | ICD-10-CM | POA: Insufficient documentation

## 2016-03-20 MED ORDER — FLUTICASONE PROPIONATE 50 MCG/ACT NA SUSP: 2.0000 | Freq: Every day | NASAL | 6 refills | Status: DC

## 2016-03-20 MED ORDER — DOXYCYCLINE MONOHYDRATE 100 MG PO CAPS: 200.0000 mg | ORAL_CAPSULE | Freq: Every day | ORAL | 0 refills | Status: AC

## 2016-03-20 NOTE — Progress Notes (Signed)
Subjective:    Patient ID: Leslie Johnston, female    DOB: [birth year], 83 y.o.   MRN: [id number]  Leslie Johnston is a 83 y.o. female presenting on 03/20/2016 for Sinus Problem (runny nose, nose bleed)   HPI   Leslie Johnston is accompanied today by one of her daughters.  Sinus Congestion/drainage Congestion x 1 month.  Now she reports post nasal drip, some nausea after post nasal drip started.  She also reports a headache, sinus pressure, cough without congestion, fever (101-102) last fever Saturday night; fever comes and goes. She denies vomiting and tooth/jaw pain.  Medications taken: mucinex, claritin, cold medicine (for high blood pressure) She has used afrin in the past, but not in the last month.  She reports one episode of epistaxis on 3/19 in am.  She checks her blood pressure at home and reports her BP was 127/80 after her nose bleed.  She reports that her Systolic BP was 79 when she went to bed that night.  Environmental Allergies Leslie Johnston notes that she has congestion throughout the year from allergies.  She runs a vaporizer/humidifier every day at home.  She changes the water daily and reports that one of her four daughters helps her keep it clean.  She takes Claritin and Flonase only as needed, not every day.   Social History  Substance Use Topics  . Smoking status: Never Smoker  . Smokeless tobacco: Never Used  . Alcohol use No    Review of Systems Per HPI unless specifically indicated above     Objective:    BP (!) 160/99 (BP Location: Left Arm, Patient Position: Sitting, Cuff Size: Normal)   Pulse 90   Temp 97.8 F (36.6 C) (Oral)   Resp 16   Ht 5\' 7"  (1.702 m)   Wt 121 lb (54.9 kg)   BMI 18.95 kg/m    BP recheck: 140/72  Wt Readings from Last 3 Encounters:  03/20/16 121 lb (54.9 kg)  09/19/15 126 lb 6.4 oz (57.3 kg)  01/07/15 124 lb (56.2 kg)    Physical Exam  Constitutional: She is oriented to person, place, and time. She appears  well-developed.  Thin  HENT:  Head: Normocephalic and atraumatic.  Right Ear: Decreased hearing is noted.  Left Ear: Tympanic membrane, external ear and ear canal normal. No drainage, swelling or tenderness.  No middle ear effusion. Decreased hearing is noted.  Nose: Mucosal edema and rhinorrhea present. Right sinus exhibits maxillary sinus tenderness and frontal sinus tenderness. Left sinus exhibits maxillary sinus tenderness and frontal sinus tenderness.  Mouth/Throat: Uvula is midline, oropharynx is clear and moist and mucous membranes are normal. No oropharyngeal exudate, posterior oropharyngeal edema or posterior oropharyngeal erythema.  Unable to view R ear canal r/t cerumen in external ear canal.  Cardiovascular: Normal rate, regular rhythm and normal heart sounds.   Pulmonary/Chest: Effort normal and breath sounds normal. No respiratory distress.  Neurological: She is alert and oriented to person, place, and time.  Skin: Skin is warm and dry.  Psychiatric: She has a normal mood and affect. Her behavior is normal. Judgment and thought content normal.       Assessment & Plan:   Problem List Items Addressed This Visit    Chronic seasonal allergic rhinitis  You have a bacterial sinus infection today.   1. Start Flonase 1 spray to each nostril daily beginning in 1 week.  Delay initiation r/t epistaxis.   2. Take over the counter Claritin daily for  allergies.   Other Visit Diagnoses    Acute bacterial rhinosinusitis    -  Primary   Relevant Medications   fluticasone (FLONASE) 50 MCG/ACT nasal spray   doxycycline (MONODOX) 100 MG capsule  1. Take 2 tablets doxycycline every day for 10 days.  Divide doses to q 12 hrs if single dose not tolerated. 2. Use afrin 2 days only for nasal congestion.    Meds ordered this encounter  Medications  . calcium citrate-vitamin D (CITRACAL+D) 315-200 MG-UNIT tablet    Sig: Take 1 tablet by mouth 2 (two) times daily.  . Multiple Vitamin  (MULTIVITAMIN WITH MINERALS) TABS tablet    Sig: Take 1 tablet by mouth daily.  . Ascorbic Acid (VITAMIN C) 100 MG tablet    Sig: Take 100 mg by mouth daily.  . fluticasone (FLONASE) 50 MCG/ACT nasal spray    Sig: Place 2 sprays into both nostrils daily.    Dispense:  16 g    Refill:  6    Order Specific Question:   Supervising Provider    Answer:   Olin Hauser [[id number]]  . doxycycline (MONODOX) 100 MG capsule    Sig: Take 2 capsules (200 mg total) by mouth daily. If you have nausea, take 1 capsule by mouth every 12 hours.    Dispense:  12 capsule    Refill:  0    Order Specific Question:   Supervising Provider    Answer:   Olin Hauser [[id number]]      Follow up plan: Return in about 1 month (around 04/20/2016) for annual medicare wellness.  Follow up prn for allergies and congestion.    Cassell Smiles, AGPCNP-BC [facility] 03/20/2016, 10:10 AM

## 2016-03-20 NOTE — Progress Notes (Signed)
I have reviewed this encounter including the documentation in this note and/or discussed this patient with the provider, Cassell Smiles, AGPCNP-BC. I am certifying that I agree with the content of this note as supervising physician.  Nobie Putnam, [facility] 03/20/2016, 11:07 PM

## 2016-03-20 NOTE — Patient Instructions (Addendum)
Thank you for coming in to clinic today.  You have a bacterial sinus infection today.   1. Take 2 tablets doxycycline every day.  If you have nausea, take 1 tablet every 12 hours (twice per day).  Complete all pills in the bottle. 2. For your nose congestion: Use afrin for the next 2 days.  Use Flonase every day after 1 week to allow your nose bleed to heal.   3. For your nose congestion from allergies: Take Claritin every day.  4. Schedule a wellness visit in 1 month.    Please schedule a follow-up appointment with Cassell Smiles, AGNP in 1 month for your annual medicare wellness visit.  If you have any other questions or concerns, please feel free to call the clinic or send a message through [facility]. You may also schedule an earlier appointment if necessary.  Cassell Smiles, DNP, AGPCNP-BC [facility]

## 2016-04-19 ENCOUNTER — Ambulatory Visit (INDEPENDENT_AMBULATORY_CARE_PROVIDER_SITE_OTHER): Payer: Medicare Other | Admitting: Nurse Practitioner

## 2016-04-19 ENCOUNTER — Encounter: Payer: Self-pay | Admitting: Nurse Practitioner

## 2016-04-19 VITALS — BP 147/81 | HR 86 | Temp 98.3°F | Resp 16 | Ht 67.0 in | Wt 124.0 lb

## 2016-04-19 DIAGNOSIS — J329 Chronic sinusitis, unspecified: Secondary | ICD-10-CM | POA: Diagnosis not present

## 2016-04-19 MED ORDER — LEVOFLOXACIN 500 MG PO TABS: 500.0000 mg | ORAL_TABLET | Freq: Every day | ORAL | 0 refills | Status: AC

## 2016-04-19 MED ORDER — SACCHAROMYCES BOULARDII 250 MG PO PACK: 250.0000 mg | PACK | Freq: Every day | ORAL | 0 refills | Status: DC

## 2016-04-19 NOTE — Patient Instructions (Signed)
Leslie Johnston, Thank you for coming in to clinic today.  You have a repeat sinus infection. - I need to have an ENT specialist help me to help you.  You have had several sinus infections in the last 7 months.  There may be something preventing your sinuses from draining that this specialist can help treat.  - Take levafloxacin 500 mg tablet once daily for 7 days.   - Take a probiotic pill.  I have sent a prescription to the pharmacy for one. - You can also replace good bacteria with fresh, refrigerated sauerkraut.  To get the benefit of adding probiotics to your intestines, do not cook it. Eat it raw.  Yogurt also replaces good bacteria.  - STOP the flonase for 2 weeks. - Continue your claritin, Mucinex, and daily 81 mg aspirin.  - IF you need something for pain, fever, or aches take Tylenol 325mg  tablets.  TAKE 2 tablets every 6-8 hours.  - For congestion, use only saline spray from over the counter.  Do not use afrin.   You can reschedule your medicare wellness visit if you need to.  This can be   Please schedule a follow-up appointment with Cassell Smiles, AGNP in 1-2 weeks as needed.  If you have any other questions or concerns, please feel free to call the clinic or send a message through [place]. You may also schedule an earlier appointment if necessary.  Cassell Smiles, DNP, AGNP-BC Adult Gerontology Nurse Practitioner [facility]    Sinusitis, Adult Sinusitis is soreness and inflammation of your sinuses. Sinuses are hollow spaces in the bones around your face. Your sinuses are located:  Around your eyes.  In the middle of your forehead.  Behind your nose.  In your cheekbones. Your sinuses and nasal passages are lined with a stringy fluid (mucus). Mucus normally drains out of your sinuses. When your nasal tissues become inflamed or swollen, the mucus can become trapped or blocked so air cannot flow through your sinuses. This allows bacteria, viruses,  and funguses to grow, which leads to infection. Sinusitis can develop quickly and last for 7?10 days (acute) or for more than 12 weeks (chronic). Sinusitis often develops after a cold. What are the causes? This condition is caused by anything that creates swelling in the sinuses or stops mucus from draining, including:  Allergies.  Asthma.  Bacterial or viral infection.  Abnormally shaped bones between the nasal passages.  Nasal growths that contain mucus (nasal polyps).  Narrow sinus openings.  Pollutants, such as chemicals or irritants in the air.  A foreign object stuck in the nose.  A fungal infection. This is rare. What increases the risk? The following factors may make you more likely to develop this condition:  Having allergies or asthma.  Having had a recent cold or respiratory tract infection.  Having structural deformities or blockages in your nose or sinuses.  Having a weak immune system.  Doing a lot of swimming or diving.  Overusing nasal sprays.  Smoking. What are the signs or symptoms? The main symptoms of this condition are pain and a feeling of pressure around the affected sinuses. Other symptoms include:  Upper toothache.  Earache.  Headache.  Bad breath.  Decreased sense of smell and taste.  A cough that may get worse at night.  Fatigue.  Fever.  Thick drainage from your nose. The drainage is often green and it may contain pus (purulent).  Stuffy nose or congestion.  Postnasal drip.  This is when extra mucus collects in the throat or back of the nose.  Swelling and warmth over the affected sinuses.  Sore throat.  Sensitivity to light. How is this diagnosed? This condition is diagnosed based on symptoms, a medical history, and a physical exam. To find out if your condition is acute or chronic, your health care provider may:  Look in your nose for signs of nasal polyps.  Tap over the affected sinus to check for signs of  infection.  View the inside of your sinuses using an imaging device that has a light attached (endoscope). If your health care provider suspects that you have chronic sinusitis, you may also:  Be tested for allergies.  Have a sample of mucus taken from your nose (nasal culture) and checked for bacteria.  Have a mucus sample examined to see if your sinusitis is related to an allergy. If your sinusitis does not respond to treatment and it lasts longer than 8 weeks, you may have an MRI or CT scan to check your sinuses. These scans also help to determine how severe your infection is. In rare cases, a bone biopsy may be done to rule out more serious types of fungal sinus disease. How is this treated? Treatment for sinusitis depends on the cause and whether your condition is chronic or acute. If a virus is causing your sinusitis, your symptoms will go away on their own within 10 days. You may be given medicines to relieve your symptoms, including:  Topical nasal decongestants. They shrink swollen nasal passages and let mucus drain from your sinuses.  Antihistamines. These drugs block inflammation that is triggered by allergies. This can help to ease swelling in your nose and sinuses.  Topical nasal corticosteroids. These are nasal sprays that ease inflammation and swelling in your nose and sinuses.  Nasal saline washes. These rinses can help to get rid of thick mucus in your nose. If your condition is caused by bacteria, you will be given an antibiotic medicine. If your condition is caused by a fungus, you will be given an antifungal medicine. Surgery may be needed to correct underlying conditions, such as narrow nasal passages. Surgery may also be needed to remove polyps. Follow these instructions at home: Medicines   Take, use, or apply over-the-counter and prescription medicines only as told by your health care provider. These may include nasal sprays.  If you were prescribed an antibiotic  medicine, take it as told by your health care provider. Do not stop taking the antibiotic even if you start to feel better. Hydrate and Humidify   Drink enough water to keep your urine clear or pale yellow. Staying hydrated will help to thin your mucus.  Use a cool mist humidifier to keep the humidity level in your home above 50%.  Inhale steam for 10-15 minutes, 3-4 times a day or as told by your health care provider. You can do this in the bathroom while a hot shower is running.  Limit your exposure to cool or dry air. Rest   Rest as much as possible.  Sleep with your head raised (elevated).  Make sure to get enough sleep each night. General instructions   Apply a warm, moist washcloth to your face 3-4 times a day or as told by your health care provider. This will help with discomfort.  Wash your hands often with soap and water to reduce your exposure to viruses and other germs. If soap and water are not available, use hand  sanitizer.  Do not smoke. Avoid being around people who are smoking (secondhand smoke).  Keep all follow-up visits as told by your health care provider. This is important. Contact a health care provider if:  You have a fever.  Your symptoms get worse.  Your symptoms do not improve within 10 days. Get help right away if:  You have a severe headache.  You have persistent vomiting.  You have pain or swelling around your face or eyes.  You have vision problems.  You develop confusion.  Your neck is stiff.  You have trouble breathing. This information is not intended to replace advice given to you by your health care provider. Make sure you discuss any questions you have with your health care provider. Document Released: 12/18/2004 Document Revised: 08/14/2015 Document Reviewed: 10/13/2014 Elsevier Interactive Patient Education  2017 [employer].

## 2016-04-19 NOTE — Progress Notes (Signed)
Subjective:    Patient ID: Leslie Johnston, female    DOB: 19-Dec-1925, 83 y.o.   MRN: [id number]  Leslie Johnston is a 83 y.o. female presenting on 04/19/2016 for Nasal Congestion   HPI  Nasal Congestion and Sinus Pressure Grandaughter accompanies patient for the visit today. Patient's last sinus infection addressed during visit on 03/20/16 provided an Rx for doxycycline.  She states the antibiotic helped and she felt better for a few days.  Start of most recent symptoms was last Saturday (6 days ago).  Her symptoms included hoarseness, post nasal drip "into my chest", productive cough with yellow sputum.  She has had some fever at 102 degrees on Tuesday night and 101 degrees on Wed night.  She adds she also has sinus tenderness, pressure, headache and a knife-like pain in her R maxillary sinus beside her nose.  She expresses concern about infection in her lungs now because of her cough.    She denies ear pressure or fullness and tooth/jaw pain.  She has continued to use flonase and claritin daily.  SHe also took two to three 81 mg tabs aspirin for a few days.  She is taking Mucinex DM for her cough.  She has added a salt/baking soda gargle for her sore throat with some relief.  This is her third visit for sinus infection since September 2017.  Social History  Substance Use Topics  . Smoking status: Never Smoker  . Smokeless tobacco: Never Used  . Alcohol use No    Review of Systems Per HPI unless specifically indicated above     Objective:    BP (!) 147/81 (BP Location: Right Arm, Patient Position: Sitting, Cuff Size: Normal)   Pulse 86   Temp 98.3 F (36.8 C) (Oral)   Resp 16   Ht 5\' 7"  (1.702 m)   Wt 124 lb (56.2 kg)   BMI 19.42 kg/m    Wt Readings from Last 3 Encounters:  04/19/16 124 lb (56.2 kg)  03/20/16 121 lb (54.9 kg)  09/19/15 126 lb 6.4 oz (57.3 kg)    Physical Exam  Constitutional: She is oriented to person, place, and time. She appears well-developed  and well-nourished. No distress.  HENT:  Head: Normocephalic and atraumatic.  Right Ear: Tympanic membrane, external ear and ear canal normal.  Left Ear: Tympanic membrane, external ear and ear canal normal.  Nose: Mucosal edema and rhinorrhea present. No sinus tenderness, nasal deformity, septal deviation or nasal septal hematoma. Right sinus exhibits maxillary sinus tenderness and frontal sinus tenderness. Left sinus exhibits maxillary sinus tenderness and frontal sinus tenderness.  Mucosal ulceration present in bilateral nostrils. Mucosal appearance c/w corticosteroid atrophy.  Ongoing nasal airway narrowing. Right maxillary sinus is most tender.  Neck: Normal range of motion. Neck supple. No JVD present. No tracheal deviation present. No thyromegaly present.  Cardiovascular: Normal rate, regular rhythm, normal heart sounds and intact distal pulses.   Pulmonary/Chest: Effort normal and breath sounds normal.  Musculoskeletal: She exhibits no edema.  Lymphadenopathy:    She has no cervical adenopathy.  Neurological: She is oriented to person, place, and time.  Hard of hearing.  Uses hearing aids, but still needs to use lip reading to understand conversation.  Skin: Skin is warm and dry.  Psychiatric: She has a normal mood and affect. Her behavior is normal. Judgment and thought content normal.      Assessment & Plan:   Problem List Items Addressed This Visit    None  Visit Diagnoses    Recurrent sinusitis    -  Primary New acute infection with chronic sinus congestion.  Patient resistant to ENT referral, however willing to go.  Granddaughter accompanying patient agrees with referral.  Plan: 1. Patient allergic to PCN.  Recent treatment with doxycycline.  Treat today with levofloxacin 500 mg tablet take one tablet once daily for 7 days. 2. Refer to ENT. 3. Mucosal thinning with ongoing airway narrowing.  STOP the flonase for 2 weeks. 4. May use saline spray for congestion.  Do not  use afrin. 5. Use Tylenol for fever, aches, and pains. 6. Add a probiotic pill and/or dietary probiotic with yogurt or small amounts of sauerkraut r/t high sodium content. 7. Follow up 7-14 days if no improvement or if symptoms persist.   Relevant Medications   levofloxacin (LEVAQUIN) 500 MG tablet   Saccharomyces boulardii 250 MG PACK   Other Relevant Orders   Ambulatory referral to ENT      Meds ordered this encounter  Medications  . levofloxacin (LEVAQUIN) 500 MG tablet    Sig: Take 1 tablet (500 mg total) by mouth daily.    Dispense:  7 tablet    Refill:  0    Order Specific Question:   Supervising Provider    Answer:   Olin Hauser [[id number]]  . Saccharomyces boulardii 250 MG PACK    Sig: Take 250 mg by mouth daily.    Dispense:  20 each    Refill:  0    Order Specific Question:   Supervising Provider    Answer:   Olin Hauser [[id number]]      Follow up plan: Return if symptoms worsen or fail to improve.  Cassell Smiles, DNP, AGPCNP-BC Adult Gerontology Primary Care Nurse Practitioner [facility] 04/21/2016, 7:40 PM

## 2016-04-23 ENCOUNTER — Ambulatory Visit: Payer: Medicare Other | Admitting: Nurse Practitioner

## 2016-04-23 NOTE — Progress Notes (Signed)
I have reviewed this encounter including the documentation in this note and/or discussed this patient with the provider, Cassell Smiles, AGPCNP-BC. I am certifying that I agree with the content of this note as supervising physician.  Nobie Putnam, [facility] 04/23/2016, 2:54 PM

## 2016-05-15 ENCOUNTER — Telehealth: Payer: Self-pay | Admitting: Nurse Practitioner

## 2016-05-15 NOTE — Telephone Encounter (Signed)
Called pt to schedule Annual Wellness Visit with Nurse Health Advisor for 5/22 move from 5/21  :  - knb

## 2016-05-21 ENCOUNTER — Ambulatory Visit: Payer: Medicare Other | Admitting: Nurse Practitioner

## 2016-05-29 ENCOUNTER — Ambulatory Visit: Payer: Medicare Other

## 2016-06-19 ENCOUNTER — Ambulatory Visit (INDEPENDENT_AMBULATORY_CARE_PROVIDER_SITE_OTHER): Payer: Medicare Other

## 2016-06-19 VITALS — BP 126/66 | HR 72 | Temp 98.7°F | Resp 15 | Ht 67.0 in | Wt 123.4 lb

## 2016-06-19 DIAGNOSIS — Z23 Encounter for immunization: Secondary | ICD-10-CM | POA: Diagnosis not present

## 2016-06-19 DIAGNOSIS — Z0000 Encounter for general adult medical examination without abnormal findings: Secondary | ICD-10-CM

## 2016-06-19 NOTE — Progress Notes (Signed)
Subjective:   Leslie Johnston is a 83 y.o. female who presents for Medicare Annual (Subsequent) preventive examination.  Review of Systems:   Cardiac Risk Factors include: advanced age (>29men, >53 women);hypertension     Objective:     Vitals: BP 126/66 (BP Location: Left Arm, Patient Position: Sitting)   Pulse 72   Temp 98.7 F (37.1 C)   Resp 15   Ht 5\' 7"  (1.702 m)   Wt 123 lb 6.4 oz (56 kg)   BMI 19.33 kg/m   Body mass index is 19.33 kg/m.   Tobacco History  Smoking Status  . Never Smoker  Smokeless Tobacco  . Never Used     Counseling given: Not Answered   Past Medical History:  Diagnosis Date  . Allergy   . Hypertension    Past Surgical History:  Procedure Laterality Date  . EYE SURGERY     cataract extraction- Right   Family History  Problem Relation Age of Onset  . Pancreatic cancer Sister   . Arthritis Maternal Grandmother   . Leukemia Maternal Grandfather   . Asthma Maternal Grandfather    History  Sexual Activity  . Sexual activity: Not on file    Outpatient Encounter Prescriptions as of 06/19/2016  Medication Sig  . Ascorbic Acid (VITAMIN C) 100 MG tablet Take 100 mg by mouth daily.  Marland Kitchen aspirin 81 MG tablet Take 81 mg by mouth daily.  . calcium citrate-vitamin D (CITRACAL+D) 315-200 MG-UNIT tablet Take 1 tablet by mouth 2 (two) times daily.  Marland Kitchen loratadine (CLARITIN) 10 MG tablet Take 10 mg by mouth daily.  . Multiple Vitamin (MULTIVITAMIN WITH MINERALS) TABS tablet Take 1 tablet by mouth daily.  . fluticasone (FLONASE) 50 MCG/ACT nasal spray Place 2 sprays into both nostrils daily. (Patient not taking: Reported on 06/19/2016)  . oxymetazoline (AFRIN NASAL SPRAY) 0.05 % nasal spray Place 1 spray into both nostrils 2 (two) times daily. (Patient not taking: Reported on 06/19/2016)  . [DISCONTINUED] dextromethorphan-guaiFENesin (MUCINEX DM) 30-600 MG 12hr tablet Take 1 tablet by mouth 2 (two) times daily. (Patient not taking: Reported on  06/19/2016)  . [DISCONTINUED] Saccharomyces boulardii 250 MG PACK Take 250 mg by mouth daily. (Patient not taking: Reported on 06/19/2016)   No facility-administered encounter medications on file as of 06/19/2016.     Activities of Daily Living In your present state of health, do you have any difficulty performing the following activities: 06/19/2016 03/20/2016  Hearing? N Y  Vision? N N  Difficulty concentrating or making decisions? N N  Walking or climbing stairs? N Y  Dressing or bathing? N N  Doing errands, shopping? N N  Preparing Food and eating ? N -  Using the Toilet? N -  In the past six months, have you accidently leaked urine? N -  Do you have problems with loss of bowel control? N -  Managing your Medications? N -  Managing your Finances? N -  Housekeeping or managing your Housekeeping? N -  Some recent data might be hidden    Patient Care Team: [name], NP as PCP - General    Assessment:     Exercise Activities and Dietary recommendations Current Exercise Habits: The patient does not participate in regular exercise at present, Exercise limited by: None identified  Goals    . Family to eat at dinner table more often          Pt would like some help cleaning dishes when family arrives  for family meals.     . Increase water intake          Recommend drinking at least 4-5 glasses of water a day       Fall Risk Fall Risk  06/19/2016 03/20/2016 07/27/2014 07/27/2014 06/04/2014  Falls in the past year? No Yes Yes No No  Number falls in past yr: - 2 or more 1 - -  Injury with Fall? - No Yes - -  Risk for fall due to : - History of fall(s) - - -  Follow up - - Education provided;Falls prevention discussed;Falls evaluation completed - -   Depression Screen PHQ 2/9 Scores 06/19/2016 03/20/2016 07/27/2014 06/04/2014  PHQ - 2 Score 0 0 0 0     Cognitive Function MMSE - Mini Mental State Exam 07/27/2014  Orientation to time 5  Orientation to Place 5    Registration 3  Attention/ Calculation 5  Recall 3  Language- name 2 objects 2  Language- repeat 1  Language- follow 3 step command 3  Language- read & follow direction 1  Write a sentence 1  Copy design 1  Total score 30     6CIT Screen 06/19/2016  What Year? 0 points  What month? 0 points  What time? 0 points  Count back from 20 0 points  Months in reverse 0 points  Repeat phrase 0 points  Total Score 0    Immunization History  Administered Date(s) Administered  . Pneumococcal Conjugate-13 04/25/2013  . Pneumococcal Polysaccharide-23 06/19/2016   Screening Tests Health Maintenance  Topic Date Due  . INFLUENZA VACCINE  08/01/2016  . TETANUS/TDAP  01/01/2021  . DEXA SCAN  Completed  . PNA vac Low Risk Adult  Completed      Plan:    I have personally reviewed and addressed the Medicare Annual Wellness questionnaire and have noted the following in the patient's chart:  A. Medical and social history B. Use of alcohol, tobacco or illicit drugs  C. Current medications and supplements D. Functional ability and status E.  Nutritional status F.  Physical activity G. Advance directives H. List of other physicians I.  Hospitalizations, surgeries, and ER visits in previous 12 months J.  [place] such as hearing and vision if needed, cognitive and depression L. Referrals and appointments  In addition, I have reviewed and discussed with patient certain preventive protocols, quality metrics, and best practice recommendations. A written personalized care plan for preventive services as well as general preventive health recommendations were provided to patient.   Signed,  Tyler Aas, LPN Nurse Health Advisor   MD Recommendations: none

## 2016-06-19 NOTE — Patient Instructions (Addendum)
Ms. Leslie Johnston , Thank you for taking time to come for your Medicare Wellness Visit. I appreciate your ongoing commitment to your health goals. Please review the following plan we discussed and let me know if I can assist you in the future.   Screening recommendations/referrals: Colonoscopy: completed 07/27/2014, no longer required Mammogram: no longer required Bone Density: completed 12/01/2013 Recommended yearly ophthalmology/optometry visit for glaucoma screening and checkup Recommended yearly dental visit for hygiene and checkup  Vaccinations: Influenza vaccine: due 09/2016 Pneumococcal vaccine: pneumovax 23 received today  Tdap vaccine: up to date Shingles vaccine: due, check with your insurance company for coverage   Advanced directives: Advance directive discussed with you today. I have provided a copy for you to complete at home and have notarized. Once this is complete please bring a copy in to our office so we can scan it into your chart.  Conditions/risks identified: Recommend drinking at least 4-5 glasses of water a day   Next appointment: Follow up in one year for your annual wellness exam.   Preventive Care 65 Years and Older, Female Preventive care refers to lifestyle choices and visits with your health care provider that can promote health and wellness. What does preventive care include?  A yearly physical exam. This is also called an annual well check.  Dental exams once or twice a year.  Routine eye exams. Ask your health care provider how often you should have your eyes checked.  Personal lifestyle choices, including:  Daily care of your teeth and gums.  Regular physical activity.  Eating a healthy diet.  Avoiding tobacco and drug use.  Limiting alcohol use.  Practicing safe sex.  Taking low-dose aspirin every day.  Taking vitamin and mineral supplements as recommended by your health care provider. What happens during an annual well check? The services  and screenings done by your health care provider during your annual well check will depend on your age, overall health, lifestyle risk factors, and family history of disease. Counseling  Your health care provider may ask you questions about your:  Alcohol use.  Tobacco use.  Drug use.  Emotional well-being.  Home and relationship well-being.  Sexual activity.  Eating habits.  History of falls.  Memory and ability to understand (cognition).  Work and work Statistician.  Reproductive health. Screening  You may have the following tests or measurements:  Height, weight, and BMI.  Blood pressure.  Lipid and cholesterol levels. These may be checked every 5 years, or more frequently if you are over 42 years old.  Skin check.  Lung cancer screening. You may have this screening every year starting at age 44 if you have a 30-pack-year history of smoking and currently smoke or have quit within the past 15 years.  Fecal occult blood test (FOBT) of the stool. You may have this test every year starting at age 50.  Flexible sigmoidoscopy or colonoscopy. You may have a sigmoidoscopy every 5 years or a colonoscopy every 10 years starting at age 52.  Hepatitis C blood test.  Hepatitis B blood test.  Sexually transmitted disease (STD) testing.  Diabetes screening. This is done by checking your blood sugar (glucose) after you have not eaten for a while (fasting). You may have this done every 1-3 years.  Bone density scan. This is done to screen for osteoporosis. You may have this done starting at age 60.  Mammogram. This may be done every 1-2 years. Talk to your health care provider about how often you should  have regular mammograms. Talk with your health care provider about your test results, treatment options, and if necessary, the need for more tests. Vaccines  Your health care provider may recommend certain vaccines, such as:  Influenza vaccine. This is recommended every  year.  Tetanus, diphtheria, and acellular pertussis (Tdap, Td) vaccine. You may need a Td booster every 10 years.  Zoster vaccine. You may need this after age 64.  Pneumococcal 13-valent conjugate (PCV13) vaccine. One dose is recommended after age 7.  Pneumococcal polysaccharide (PPSV23) vaccine. One dose is recommended after age 67. Talk to your health care provider about which screenings and vaccines you need and how often you need them. This information is not intended to replace advice given to you by your health care provider. Make sure you discuss any questions you have with your health care provider. Document Released: 01/14/2015 Document Revised: 09/07/2015 Document Reviewed: 10/19/2014 Elsevier Interactive Patient Education  2017 [place] Prevention in the Home Falls can cause injuries. They can happen to people of all ages. There are many things you can do to make your home safe and to help prevent falls. What can I do on the outside of my home?  Regularly fix the edges of walkways and driveways and fix any cracks.  Remove anything that might make you trip as you walk through a door, such as a raised step or threshold.  Trim any bushes or trees on the path to your home.  Use bright outdoor lighting.  Clear any walking paths of anything that might make someone trip, such as rocks or tools.  Regularly check to see if handrails are loose or broken. Make sure that both sides of any steps have handrails.  Any raised decks and porches should have guardrails on the edges.  Have any leaves, snow, or ice cleared regularly.  Use sand or salt on walking paths during winter.  Clean up any spills in your garage right away. This includes oil or grease spills. What can I do in the bathroom?  Use night lights.  Install grab bars by the toilet and in the tub and shower. Do not use towel bars as grab bars.  Use non-skid mats or decals in the tub or shower.  If you  need to sit down in the shower, use a plastic, non-slip stool.  Keep the floor dry. Clean up any water that spills on the floor as soon as it happens.  Remove soap buildup in the tub or shower regularly.  Attach bath mats securely with double-sided non-slip rug tape.  Do not have throw rugs and other things on the floor that can make you trip. What can I do in the bedroom?  Use night lights.  Make sure that you have a light by your bed that is easy to reach.  Do not use any sheets or blankets that are too big for your bed. They should not hang down onto the floor.  Have a firm chair that has side arms. You can use this for support while you get dressed.  Do not have throw rugs and other things on the floor that can make you trip. What can I do in the kitchen?  Clean up any spills right away.  Avoid walking on wet floors.  Keep items that you use a lot in easy-to-reach places.  If you need to reach something above you, use a strong step stool that has a grab bar.  Keep electrical cords out of  the way.  Do not use floor polish or wax that makes floors slippery. If you must use wax, use non-skid floor wax.  Do not have throw rugs and other things on the floor that can make you trip. What can I do with my stairs?  Do not leave any items on the stairs.  Make sure that there are handrails on both sides of the stairs and use them. Fix handrails that are broken or loose. Make sure that handrails are as long as the stairways.  Check any carpeting to make sure that it is firmly attached to the stairs. Fix any carpet that is loose or worn.  Avoid having throw rugs at the top or bottom of the stairs. If you do have throw rugs, attach them to the floor with carpet tape.  Make sure that you have a light switch at the top of the stairs and the bottom of the stairs. If you do not have them, ask someone to add them for you. What else can I do to help prevent falls?  Wear shoes  that:  Do not have high heels.  Have rubber bottoms.  Are comfortable and fit you well.  Are closed at the toe. Do not wear sandals.  If you use a stepladder:  Make sure that it is fully opened. Do not climb a closed stepladder.  Make sure that both sides of the stepladder are locked into place.  Ask someone to hold it for you, if possible.  Clearly mark and make sure that you can see:  Any grab bars or handrails.  First and last steps.  Where the edge of each step is.  Use tools that help you move around (mobility aids) if they are needed. These include:  Canes.  Walkers.  Scooters.  Crutches.  Turn on the lights when you go into a dark area. Replace any light bulbs as soon as they burn out.  Set up your furniture so you have a clear path. Avoid moving your furniture around.  If any of your floors are uneven, fix them.  If there are any pets around you, be aware of where they are.  Review your medicines with your doctor. Some medicines can make you feel dizzy. This can increase your chance of falling. Ask your doctor what other things that you can do to help prevent falls. This information is not intended to replace advice given to you by your health care provider. Make sure you discuss any questions you have with your health care provider. Document Released: 10/14/2008 Document Revised: 05/26/2015 Document Reviewed: 01/22/2014 Elsevier Interactive Patient Education  2017 [employer].

## 2016-08-28 ENCOUNTER — Emergency Department: Payer: Medicare Other

## 2016-08-28 ENCOUNTER — Other Ambulatory Visit: Payer: Self-pay

## 2016-08-28 ENCOUNTER — Encounter: Payer: Self-pay | Admitting: Emergency Medicine

## 2016-08-28 ENCOUNTER — Observation Stay
Admission: EM | Admit: 2016-08-28 | Discharge: 2016-08-30 | Disposition: A | Discharge status: Home / Self Care | Payer: Medicare Other | Attending: Specialist | Admitting: Specialist

## 2016-08-28 DIAGNOSIS — Z800 Family history of malignant neoplasm of digestive organs: Secondary | ICD-10-CM | POA: Diagnosis not present

## 2016-08-28 DIAGNOSIS — W1830XA Fall on same level, unspecified, initial encounter: Secondary | ICD-10-CM | POA: Diagnosis not present

## 2016-08-28 DIAGNOSIS — I10 Essential (primary) hypertension: Secondary | ICD-10-CM | POA: Insufficient documentation

## 2016-08-28 DIAGNOSIS — Z806 Family history of leukemia: Secondary | ICD-10-CM | POA: Insufficient documentation

## 2016-08-28 DIAGNOSIS — Z8261 Family history of arthritis: Secondary | ICD-10-CM | POA: Insufficient documentation

## 2016-08-28 DIAGNOSIS — S42294A Other nondisplaced fracture of upper end of right humerus, initial encounter for closed fracture: Secondary | ICD-10-CM

## 2016-08-28 DIAGNOSIS — S32810A Multiple fractures of pelvis with stable disruption of pelvic ring, initial encounter for closed fracture: Secondary | ICD-10-CM | POA: Diagnosis not present

## 2016-08-28 DIAGNOSIS — S42201A Unspecified fracture of upper end of right humerus, initial encounter for closed fracture: Secondary | ICD-10-CM | POA: Diagnosis not present

## 2016-08-28 DIAGNOSIS — S42291A Other displaced fracture of upper end of right humerus, initial encounter for closed fracture: Secondary | ICD-10-CM | POA: Diagnosis not present

## 2016-08-28 DIAGNOSIS — Z880 Allergy status to penicillin: Secondary | ICD-10-CM | POA: Diagnosis not present

## 2016-08-28 DIAGNOSIS — S32511A Fracture of superior rim of right pubis, initial encounter for closed fracture: Secondary | ICD-10-CM | POA: Diagnosis not present

## 2016-08-28 DIAGNOSIS — Z888 Allergy status to other drugs, medicaments and biological substances status: Secondary | ICD-10-CM | POA: Insufficient documentation

## 2016-08-28 DIAGNOSIS — M25551 Pain in right hip: Secondary | ICD-10-CM | POA: Diagnosis present

## 2016-08-28 DIAGNOSIS — I129 Hypertensive chronic kidney disease with stage 1 through stage 4 chronic kidney disease, or unspecified chronic kidney disease: Secondary | ICD-10-CM | POA: Diagnosis present

## 2016-08-28 DIAGNOSIS — Z7982 Long term (current) use of aspirin: Secondary | ICD-10-CM | POA: Diagnosis not present

## 2016-08-28 DIAGNOSIS — S42301A Unspecified fracture of shaft of humerus, right arm, initial encounter for closed fracture: Secondary | ICD-10-CM | POA: Diagnosis not present

## 2016-08-28 DIAGNOSIS — S329XXA Fracture of unspecified parts of lumbosacral spine and pelvis, initial encounter for closed fracture: Secondary | ICD-10-CM | POA: Diagnosis present

## 2016-08-28 DIAGNOSIS — S32501A Unspecified fracture of right pubis, initial encounter for closed fracture: Secondary | ICD-10-CM | POA: Diagnosis not present

## 2016-08-28 DIAGNOSIS — S42309A Unspecified fracture of shaft of humerus, unspecified arm, initial encounter for closed fracture: Secondary | ICD-10-CM | POA: Diagnosis present

## 2016-08-28 LAB — BASIC METABOLIC PANEL
Anion gap: 9 (ref 5–15)
BUN: 18 mg/dL (ref 6–20)
CHLORIDE: 105 mmol/L (ref 101–111)
CO2: 27 mmol/L (ref 22–32)
Calcium: 9.6 mg/dL (ref 8.9–10.3)
Creatinine, Ser: 0.82 mg/dL (ref 0.44–1.00)
GFR calc non Af Amer: >60 mL/min (ref >60)
Glucose, Bld: 190 mg/dL — ABNORMAL HIGH (ref 65–99)
POTASSIUM: 3.8 mmol/L (ref 3.5–5.1)
SODIUM: 141 mmol/L (ref 135–145)

## 2016-08-28 LAB — CBC WITH DIFFERENTIAL/PLATELET
BASOS ABS: 0 10*3/uL (ref 0–0.1)
Basophils Relative: 0 %
Eosinophils Absolute: 0.2 10*3/uL (ref 0–0.7)
Eosinophils Relative: 1 %
HEMATOCRIT: 42 % (ref 35.0–47.0)
HEMOGLOBIN: 13.6 g/dL (ref 12.0–16.0)
LYMPHS ABS: 0.8 10*3/uL — AB (ref 1.0–3.6)
LYMPHS PCT: 6 %
MCH: 27.7 pg (ref 26.0–34.0)
MCHC: 32.3 g/dL (ref 32.0–36.0)
MCV: 85.6 fL (ref 80.0–100.0)
Monocytes Absolute: 0.6 10*3/uL (ref 0.2–0.9)
Monocytes Relative: 5 %
NEUTROS ABS: 11.6 10*3/uL — AB (ref 1.4–6.5)
Neutrophils Relative %: 88 %
Platelets: 185 10*3/uL (ref 150–440)
RBC: 4.9 MIL/uL (ref 3.80–5.20)
RDW: 13.8 % (ref 11.5–14.5)
WBC: 13.2 10*3/uL — AB (ref 3.6–11.0)

## 2016-08-28 LAB — TROPONIN I

## 2016-08-28 LAB — HEPATIC FUNCTION PANEL
ALBUMIN: 3.6 g/dL (ref 3.5–5.0)
ALK PHOS: 66 U/L (ref 38–126)
ALT: 17 U/L (ref 14–54)
AST: 29 U/L (ref 15–41)
Bilirubin, Direct: <0.1 mg/dL — ABNORMAL LOW (ref 0.1–0.5)
TOTAL PROTEIN: 6.9 g/dL (ref 6.5–8.1)
Total Bilirubin: 0.4 mg/dL (ref 0.3–1.2)

## 2016-08-28 LAB — PROTIME-INR
INR: 0.99
Prothrombin Time: 13 seconds (ref 11.4–15.2)

## 2016-08-28 MED ORDER — ONDANSETRON HCL 4 MG/2ML IJ SOLN
4.0000 mg | Freq: Once | INTRAMUSCULAR | Status: AC
  Administered 2016-08-28: 4 mg via INTRAVENOUS

## 2016-08-28 MED ORDER — FENTANYL CITRATE (PF) 100 MCG/2ML IJ SOLN
75.0000 ug | Freq: Once | INTRAMUSCULAR | Status: AC
  Administered 2016-08-28: 75 ug via INTRAVENOUS
  Filled 2016-08-28: qty 2

## 2016-08-28 MED ORDER — MORPHINE SULFATE (PF) 2 MG/ML IV SOLN
INTRAVENOUS | Status: AC
  Administered 2016-08-28: 2 mg via INTRAVENOUS
  Filled 2016-08-28: qty 1

## 2016-08-28 MED ORDER — ONDANSETRON HCL 4 MG/2ML IJ SOLN
INTRAMUSCULAR | Status: AC
  Administered 2016-08-28: 4 mg via INTRAVENOUS
  Filled 2016-08-28: qty 2

## 2016-08-28 MED ORDER — MORPHINE SULFATE (PF) 2 MG/ML IV SOLN
2.0000 mg | Freq: Once | INTRAVENOUS | Status: AC
  Administered 2016-08-28: 2 mg via INTRAVENOUS

## 2016-08-28 NOTE — ED Triage Notes (Signed)
Pt to triage via w/c with no distress noted; family reports pt was dumping water out of the swing and fell backwards hr PTA; c/o pain right shoulder/upper arm and right hip; family reports pt ambulatory after fall

## 2016-08-28 NOTE — ED Provider Notes (Signed)
[facility] Emergency Department Provider Note   First MD Initiated Contact with Patient 08/28/16 2251     (approximate)  I have reviewed the triage vital signs and the nursing notes.   HISTORY  Chief Complaint Arm Injury    HPI Leslie Johnston is a 83 y.o. female with blow list of chronic medical conditions presents to the emergency Department after accidental slip and fall. Patient states that she was dumping water out ofally fell backwards. Patient its to 10 out of 10 right shoulder and pelt side.Patient denies any head injury patient denies any loss of consciousness. Patient den back   Past Medical History:  Diagnosis Date  . Allergy   . Hypertension     Patient Active Problem List   Diagnosis Date Noted  . Chronic seasonal allergic rhinitis 03/20/2016  . Abnormal chest x-ray 09/19/2015  . HTN (hypertension) 07/27/2014    Past Surgical History:  Procedure Laterality Date  . EYE SURGERY     cataract extraction- Right    Prior to Admission medications   Medication Sig Start Date End Date Taking? Authorizing Provider  Ascorbic Acid (VITAMIN C) 100 MG tablet Take 100 mg by mouth daily.    [provider]  aspirin 81 MG tablet Take 81 mg by mouth daily.    [provider]  calcium citrate-vitamin D (CITRACAL+D) 315-200 MG-UNIT tablet Take 1 tablet by mouth 2 (two) times daily.    [provider]  fluticasone (FLONASE) 50 MCG/ACT nasal spray Place 2 sprays into both nostrils daily. Patient not taking: Reported on 06/19/2016 03/20/16   [name], NP  loratadine (CLARITIN) 10 MG tablet Take 10 mg by mouth daily.    [provider]  Multiple Vitamin (MULTIVITAMIN WITH MINERALS) TABS tablet Take 1 tablet by mouth daily.    [provider]  oxymetazoline (AFRIN NASAL SPRAY) 0.05 % nasal spray Place 1 spray into both nostrils 2 (two) times daily. Patient not taking: Reported on 06/19/2016  11/16/14   [name], NP    Allergies Amlodipine; Losartan; and Penicillins  Family History  Problem Relation Age of Onset  . Pancreatic cancer Sister   . Arthritis Maternal Grandmother   . Leukemia Maternal Grandfather   . Asthma Maternal Grandfather     Social History Social History  Substance Use Topics  . Smoking status: Never Smoker  . Smokeless tobacco: Never Used  . Alcohol use No    Review of Systems Constitutional: No fever/chills Eyes: No visual changes. ENT: No sore throat. Cardiovascular: Denies chest pain. Respiratory: Denies shortness of breath. Gastrointestinal: No abdominal pain.  No nausea, no vomiting.  No diarrhea.  No constipation. Genitourinary: Negative for dysuria. Musculoskeletal: Negative for neck pain. Positive for right pelvis and right shoulder pain. Integumentary: Negative for rash. Neurological: Negative for headaches, focal weakness or numbness.   ____________________________________________   PHYSICAL EXAM:  VITAL SIGNS: ED Triage Vitals  Enc Vitals Group     BP 08/28/16 2052 137/76     Pulse Rate 08/28/16 2052 77     Resp 08/28/16 2234 (!) 23     Temp 08/28/16 2052 97.8 F (36.6 C)     Temp Source 08/28/16 2052 Oral     SpO2 08/28/16 2052 91 %     Weight 08/28/16 2052 59.4 kg (131 lb)     Height 08/28/16 2052 1.702 m (5\' 7" )     Head Circumference --      Peak Flow --  Pain Score 08/28/16 2052 10     Pain Loc --      Pain Edu? --      Excl. in [place]? --     Constitutional: Alert and oriented. Well appearing and in no acute distress. Eyes: Conjunctivae are normal. PERRL. EOMI. Head: Atraumatic. Mouth/Throat: Mucous membranes are moist.  Oropharynx non-erythematous. Neck: No stridor.   Cardiovascular: Normal rate, regular rhythm. Good peripheral circulation. Grossly normal heart sounds. Respiratory: Normal respiratory effort.  No retractions. Lungs CTAB. Gastrointestinal: Soft and nontender. No distention.    Musculoskeletal:ain right pelvis palpation. Pain with right shoulder palpation. Neurologic:  Normal speech and language. No gross focal neurologic deficits are appreciated.  Skin:  Skin is warm, dry and intact. No rash noted. Psychiatric: Mood and affect are normal. Speech and behavior are normal.  ____________________________________________   LABS (all labs ordered are listed, but only abnormal results are displayed)  Labs Reviewed  BASIC METABOLIC PANEL - Abnormal; Notable for the following:       Result Value   Glucose, Bld 190 (*)    All other components within normal limits  CBC WITH DIFFERENTIAL/PLATELET - Abnormal; Notable for the following:    WBC 13.2 (*)    Neutro Abs 11.6 (*)    Lymphs Abs 0.8 (*)    All other components within normal limits  HEPATIC FUNCTION PANEL - Abnormal; Notable for the following:    Bilirubin, Direct <0.1 (*)    All other components within normal limits  TROPONIN I  PROTIME-INR   ____________________________________________  EKG  ED ECG REPORT I, [place] N BROWN, the attending physician, personally viewed and interpreted this ECG.   Date: 08/29/2016  EKG Time: 10:28 PM  Rate: 87  Rhythm: Normal sinus rhythm  Axis: Normal  Intervals: Normal  ST&T Change: None  ____________________________________________  RADIOLOGY I, [place] N BROWN, personally viewed and evaluated these images (plain radiographs) as part of my medical decision making, as well as reviewing the written report by the radiologist.  Dg Humerus Right  Result Date: 08/28/2016 CLINICAL DATA:  Persistent pain after falling backwards tonight. EXAM: RIGHT HUMERUS - 2+ VIEW COMPARISON:  None. FINDINGS: Acute slightly impacted proximal right humeral fracture. No dislocation. No radiographic findings to suggest a pathologic basis for the fracture. No bone lesion or bony destruction. IMPRESSION: Slightly impacted proximal right humeral fracture.  No dislocation. Electronically  Signed   By: Andreas Newport M.D.   On: 08/28/2016 21:41   Dg Hip Unilat W Or Wo Pelvis 2-3 Views Right  Result Date: 08/28/2016 CLINICAL DATA:  Persistent pain after falling backwards tonight. EXAM: DG HIP (WITH OR WITHOUT PELVIS) 2-3V RIGHT COMPARISON:  None. FINDINGS: There are acute slightly displaced fractures of the right superior and inferior pubic rami and pubic body. Hip appears intact. Pubic symphysis and sacroiliac joints appear intact. IMPRESSION: Right pubic fractures.  Hip appears intact. Electronically Signed   By: Andreas Newport M.D.   On: 08/28/2016 21:43     Procedures   ____________________________________________   INITIAL IMPRESSION / ASSESSMENT AND PLAN / ED COURSE  Pertinent labs & imaging results that were available during my care of the patient were reviewed by me and considered in my medical decision making (see chart for details).  Even IV morphine and Zofran for analgesia and antiemetic respectively 83 year old female presenting with above stated history and fractures noted of the right humerus and right superior and inferior pelvic rami. Spoke with the patient's family at length regarding all clinical  findings. Patient discussed with Dr. Marcille Blanco for hospital admission for further evaluation and management.      ____________________________________________  FINAL CLINICAL IMPRESSION(S) / ED DIAGNOSES  Final diagnoses:  Other closed nondisplaced fracture of proximal end of right humerus, initial encounter  Multiple closed pelvic fractures with disruption of pelvic circle, initial encounter [facility])     MEDICATIONS GIVEN DURING THIS VISIT:  Medications  fentaNYL (SUBLIMAZE) injection 75 mcg (75 mcg Intravenous Given 08/28/16 2228)  morphine 2 MG/ML injection 2 mg (2 mg Intravenous Given 08/28/16 2329)  ondansetron (ZOFRAN) injection 4 mg (4 mg Intravenous Given 08/28/16 2329)     NEW OUTPATIENT MEDICATIONS STARTED DURING THIS VISIT:  New  Prescriptions   No medications on file    Modified Medications   No medications on file    Discontinued Medications   No medications on file     Note:  This document was prepared using Dragon voice recognition software and may include unintentional dictation errors.    Gregor Hams, MD 08/29/16 [phone]

## 2016-08-29 DIAGNOSIS — I10 Essential (primary) hypertension: Secondary | ICD-10-CM | POA: Diagnosis not present

## 2016-08-29 DIAGNOSIS — S42309A Unspecified fracture of shaft of humerus, unspecified arm, initial encounter for closed fracture: Secondary | ICD-10-CM | POA: Diagnosis not present

## 2016-08-29 DIAGNOSIS — S329XXA Fracture of unspecified parts of lumbosacral spine and pelvis, initial encounter for closed fracture: Secondary | ICD-10-CM | POA: Diagnosis not present

## 2016-08-29 DIAGNOSIS — S42201A Unspecified fracture of upper end of right humerus, initial encounter for closed fracture: Secondary | ICD-10-CM | POA: Diagnosis not present

## 2016-08-29 LAB — CBC
HEMATOCRIT: 37.6 % (ref 35.0–47.0)
Hemoglobin: 12.4 g/dL (ref 12.0–16.0)
MCH: 28.4 pg (ref 26.0–34.0)
MCHC: 32.9 g/dL (ref 32.0–36.0)
MCV: 86.2 fL (ref 80.0–100.0)
Platelets: 162 10*3/uL (ref 150–440)
RBC: 4.36 MIL/uL (ref 3.80–5.20)
RDW: 14.1 % (ref 11.5–14.5)
WBC: 9.6 10*3/uL (ref 3.6–11.0)

## 2016-08-29 LAB — BASIC METABOLIC PANEL
Anion gap: 6 (ref 5–15)
BUN: 18 mg/dL (ref 6–20)
CHLORIDE: 109 mmol/L (ref 101–111)
CO2: 27 mmol/L (ref 22–32)
Calcium: 9.2 mg/dL (ref 8.9–10.3)
Creatinine, Ser: 0.83 mg/dL (ref 0.44–1.00)
GFR calc non Af Amer: >60 mL/min (ref >60)
Glucose, Bld: 143 mg/dL — ABNORMAL HIGH (ref 65–99)
POTASSIUM: 4.1 mmol/L (ref 3.5–5.1)
SODIUM: 142 mmol/L (ref 135–145)

## 2016-08-29 MED ORDER — OXYCODONE HCL 5 MG PO TABS
5.0000 mg | ORAL_TABLET | ORAL | Status: DC | PRN
  Administered 2016-08-29 – 2016-08-30 (×6): 5 mg via ORAL
  Filled 2016-08-29 (×6): qty 1

## 2016-08-29 MED ORDER — SODIUM CHLORIDE 0.9 % IV SOLN
INTRAVENOUS | Status: AC
  Administered 2016-08-29: 01:00:00 via INTRAVENOUS

## 2016-08-29 MED ORDER — ONDANSETRON HCL 4 MG PO TABS
4.0000 mg | ORAL_TABLET | Freq: Four times a day (QID) | ORAL | Status: DC | PRN
  Administered 2016-08-29: 4 mg via ORAL
  Filled 2016-08-29: qty 1

## 2016-08-29 MED ORDER — DOCUSATE SODIUM 100 MG PO CAPS
100.0000 mg | ORAL_CAPSULE | Freq: Two times a day (BID) | ORAL | Status: DC
  Administered 2016-08-29 – 2016-08-30 (×2): 100 mg via ORAL
  Filled 2016-08-29 (×2): qty 1

## 2016-08-29 MED ORDER — ONDANSETRON HCL 4 MG/2ML IJ SOLN: 4.0000 mg | Freq: Four times a day (QID) | INTRAMUSCULAR | Status: DC | PRN

## 2016-08-29 MED ORDER — ENOXAPARIN SODIUM 40 MG/0.4ML ~~LOC~~ SOLN
40.0000 mg | SUBCUTANEOUS | Status: DC
  Administered 2016-08-29: 40 mg via SUBCUTANEOUS
  Filled 2016-08-29: qty 0.4

## 2016-08-29 MED ORDER — MORPHINE SULFATE (PF) 4 MG/ML IV SOLN
4.0000 mg | INTRAVENOUS | Status: DC | PRN
  Administered 2016-08-29 (×2): 4 mg via INTRAVENOUS
  Filled 2016-08-29 (×2): qty 1

## 2016-08-29 MED ORDER — ACETAMINOPHEN 650 MG RE SUPP: 650.0000 mg | Freq: Four times a day (QID) | RECTAL | Status: DC | PRN

## 2016-08-29 MED ORDER — MORPHINE SULFATE (PF) 2 MG/ML IV SOLN: 2.0000 mg | INTRAVENOUS | Status: DC | PRN

## 2016-08-29 MED ORDER — ACETAMINOPHEN 325 MG PO TABS: 650.0000 mg | ORAL_TABLET | Freq: Four times a day (QID) | ORAL | Status: DC | PRN

## 2016-08-29 MED ORDER — ASPIRIN EC 81 MG PO TBEC
81.0000 mg | DELAYED_RELEASE_TABLET | Freq: Every day | ORAL | Status: DC
  Administered 2016-08-29 – 2016-08-30 (×2): 81 mg via ORAL
  Filled 2016-08-29 (×2): qty 1

## 2016-08-29 NOTE — Evaluation (Signed)
Physical Therapy Evaluation Patient Details Name: Leslie Johnston MRN: [id number] DOB: May 23, 1925 Today's Date: 08/29/2016   History of Present Illness  Pt is an 83 y.o.femalewho complains of pain in the right shoulder and right hip after a fall at home. Patient lives at home at baseline. She sustained a fall onto her right side home while outdoors. Patient had x-rays taken at [facility] emergency Department overnight which showed a nondisplaced impacted fracture of the right proximal humerus and a minimally displaced fracture of the right superior rami of the pelvis. Patient is seen in her hospital room today. She is hard of hearing but responds appropriately to questions and seems alert and oriented. Patient complains of pain primarily in her right shoulder.  Assessment includes: R proximal humerus fracture, nondisplaced, and R minimally displaced superior pubic rami fracture.  PMH includes HTN and R cataract surgery.    Clinical Impression  Pt presents with deficits in strength, transfers, mobility, gait, balance, and activity tolerance.  Pt required Mod A with verbal and tactile cues for sequencing for bed mobility tasks.  Pt required Min A for sit to/from stand transfers with some posterior instability noted especially upon initial stand.  Min instability during amb 3' with hemi walker with min A to correct; pt c/o feeling nauseous and light headed during amb, pt returned to sitting and began vomitting.  BP 140/78 mmHg, SpO2 89-91% on 2LO2/min, HR low 100s, nursing notified and in room at end of session.  Pt will benefit from PT services in a SNF setting upon discharge to safely address above deficits for decreased caregiver assistance and eventual return to prior living situation.      Follow Up Recommendations SNF    Equipment Recommendations  Other (comment) (Hemi walker)    Recommendations for Other Services       Precautions / Restrictions Precautions Precautions:  Fall Required Braces or Orthoses: Sling Restrictions Weight Bearing Restrictions: Yes RUE Weight Bearing: Non weight bearing RLE Weight Bearing: Weight bearing as tolerated      Mobility  Bed Mobility Overal bed mobility: Needs Assistance Bed Mobility: Sit to Supine;Supine to Sit     Supine to sit: Mod assist Sit to supine: Mod assist      Transfers Overall transfer level: Needs assistance Equipment used: Hemi-walker Transfers: Sit to/from Stand Sit to Stand: Min assist         General transfer comment: unable due to nausea, see PT note  Ambulation/Gait Ambulation/Gait assistance: Min assist Ambulation Distance (Feet): 3 Feet Assistive device: Hemi-walker Gait Pattern/deviations: Step-to pattern;Antalgic;Shuffle;Staggering left;Staggering right   Gait velocity interpretation: <1.8 ft/sec, indicative of risk for recurrent falls General Gait Details: Min instability during amb with min A to correct; pt c/o feeling nauseous and light headed during amb, pt returned to sitting and began vomitting.  BP 140/78 mmHg, SpO2 89-91% on 2LO2/min, HR low 100s, nursing notified.  Stairs Stairs:  (Deferred)          Wheelchair Mobility    Modified Rankin (Stroke Patients Only)       Balance Overall balance assessment: Needs assistance Sitting-balance support: Feet supported;Single extremity supported Sitting balance-Leahy Scale: Fair     Standing balance support: Single extremity supported Standing balance-Leahy Scale: Poor Standing balance comment: Min instability in standing                             Pertinent Vitals/Pain Pain Assessment: 0-10 Faces Pain Scale: Hurts even  more Pain Location: RUE, pt unable to use numeric or faces scale, stated pain was "bad" in RUE and "hurts when I move" in R hip/groin Pain Descriptors / Indicators: Aching Pain Intervention(s): Limited activity within patient's tolerance;Monitored during session;Premedicated  before session;Repositioned    Home Living Family/patient expects to be discharged to:: Private residence Living Arrangements: Alone Available Help at Discharge: Family;Available 24 hours/day Type of Home: House Home Access: Stairs to enter Entrance Stairs-Rails: Left Entrance Stairs-Number of Steps: 5 Home Layout: One level Home Equipment: Cane - single point      Prior Function Level of Independence: Independent         Comments: Ind amb without AD, Ind with ADLs, multiple falls in last year per family     Hand Dominance   Dominant Hand: Right    Extremity/Trunk Assessment   Upper Extremity Assessment Upper Extremity Assessment: RUE deficits/detail;Generalized weakness RUE: Unable to fully assess due to immobilization    Lower Extremity Assessment Lower Extremity Assessment: Generalized weakness    Cervical / Trunk Assessment Cervical / Trunk Assessment: Normal  Communication   Communication: HOH  Cognition Arousal/Alertness: Lethargic Behavior During Therapy: WFL for tasks assessed/performed Overall Cognitive Status: History of cognitive impairments - at baseline                                        General Comments      Exercises     Assessment/Plan    PT Assessment Patient needs continued PT services  PT Problem List Decreased strength;Decreased activity tolerance;Decreased balance;Decreased knowledge of use of DME;Decreased mobility       PT Treatment Interventions DME instruction;Gait training;Stair training;Functional mobility training;Neuromuscular re-education;Balance training;Therapeutic exercise;Therapeutic activities;Patient/family education    PT Goals (Current goals can be found in the Care Plan section)  Acute Rehab PT Goals PT Goal Formulation: Patient unable to participate in goal setting Time For Goal Achievement: 09/11/16 Potential to Achieve Goals: Fair    Frequency 7X/week   Barriers to discharge         Co-evaluation               AM-PAC PT "6 Clicks" Daily Activity  Outcome Measure Difficulty turning over in bed (including adjusting bedclothes, sheets and blankets)?: Unable Difficulty moving from lying on back to sitting on the side of the bed? : Unable Difficulty sitting down on and standing up from a chair with arms (e.g., wheelchair, bedside commode, etc,.)?: Unable Help needed moving to and from a bed to chair (including a wheelchair)?: A Little Help needed walking in hospital room?: A Lot Help needed climbing 3-5 steps with a railing? : Total 6 Click Score: 9    End of Session Equipment Utilized During Treatment: Gait belt;Oxygen Activity Tolerance: Patient limited by fatigue;Other (comment) (Pt vomitted after short amb and remained nauseous) Patient left: in bed;with bed alarm set;with family/visitor present;with call bell/phone within reach;with nursing/sitter in room Nurse Communication: Mobility status;Other (comment) (Pt response to activity with nausea, vomitting, and light headed) PT Visit Diagnosis: Difficulty in walking, not elsewhere classified (R26.2);Muscle weakness (generalized) (M62.81)    Time: 7782-4235 PT Time Calculation (min) (ACUTE ONLY): 61 min   Charges:   PT Evaluation $PT Eval Low Complexity: 1 Low PT Treatments $Therapeutic Activity: 8-22 mins   PT G Codes:   PT G-Codes **NOT FOR INPATIENT CLASS** Functional Assessment Tool Used: AM-PAC 6 Clicks Basic Mobility Functional  Limitation: Mobility: Walking and moving around Mobility: Walking and Moving Around Current Status [phone]): At least 60 percent but less than 80 percent impaired, limited or restricted Mobility: Walking and Moving Around Goal Status [phone]): At least 1 percent but less than 20 percent impaired, limited or restricted    D. Royetta Asal PT, DPT 08/29/16, 4:33 PM

## 2016-08-29 NOTE — Consult Note (Signed)
ORTHOPAEDIC CONSULTATION  REQUESTING PHYSICIAN: Henreitta Leber, MD  Chief Complaint: Right humerus fracture and right pelvis fracture  HPI: Leslie Johnston is a 83 y.o. female who complains of  pain in the right shoulder and right hip after a fall at home. Patient lives at home in his inflammatory at baseline. She sustained a fall onto her right side yesterday at home.  Patient had x-rays taken at [facility] emergency Department overnight which showed a nondisplaced impacted fracture of the right proximal humerus and a minimally displaced fracture of the right superior rami of the pelvis. Patient is seen in her hospital room today. She is hard of hearing but responds appropriately to questions and seems alert and oriented. Patient complains of pain primarily in her right shoulder. She is surrounded by family members in her room.  Past Medical History:  Diagnosis Date  . Allergy   . Hypertension    Past Surgical History:  Procedure Laterality Date  . EYE SURGERY     cataract extraction- Right   Social History   Social History  . Marital status: Widowed    Spouse name: N/A  . Number of children: N/A  . Years of education: N/A   Social History Main Topics  . Smoking status: Never Smoker  . Smokeless tobacco: Never Used  . Alcohol use No  . Drug use: No  . Sexual activity: Not Asked   Other Topics Concern  . None   Social History Narrative  . None   Family History  Problem Relation Age of Onset  . Pancreatic cancer Sister   . Arthritis Maternal Grandmother   . Leukemia Maternal Grandfather   . Asthma Maternal Grandfather    Allergies  Allergen Reactions  . Amlodipine Swelling  . Losartan Itching  . Penicillins Hives and Other (See Comments)    Has patient had a PCN reaction causing immediate rash, facial/tongue/throat swelling, SOB or lightheadedness with hypotension: No Has patient had a PCN reaction causing severe rash involving mucus membranes or skin  necrosis: No Has patient had a PCN reaction that required hospitalization: No Has patient had a PCN reaction occurring within the last 10 years: No If all of the above answers are "NO", then may proceed with Cephalosporin use.    Prior to Admission medications   Medication Sig Start Date End Date Taking? Authorizing Provider  aspirin 81 MG tablet Take 81 mg by mouth daily.   Yes [provider]  calcium citrate-vitamin D (CITRACAL+D) 315-200 MG-UNIT tablet Take 1 tablet by mouth 2 (two) times daily.   Yes [provider]  fluticasone (FLONASE) 50 MCG/ACT nasal spray Place 2 sprays into both nostrils daily.   Yes [provider]  Multiple Vitamin (MULTIVITAMIN WITH MINERALS) TABS tablet Take 1 tablet by mouth daily.   Yes [provider]   Dg Humerus Right  Result Date: 08/28/2016 CLINICAL DATA:  Persistent pain after falling backwards tonight. EXAM: RIGHT HUMERUS - 2+ VIEW COMPARISON:  None. FINDINGS: Acute slightly impacted proximal right humeral fracture. No dislocation. No radiographic findings to suggest a pathologic basis for the fracture. No bone lesion or bony destruction. IMPRESSION: Slightly impacted proximal right humeral fracture.  No dislocation. Electronically Signed   By: Andreas Newport M.D.   On: 08/28/2016 21:41   Dg Hip Unilat W Or Wo Pelvis 2-3 Views Right  Result Date: 08/28/2016 CLINICAL DATA:  Persistent pain after falling backwards tonight. EXAM: DG HIP (WITH OR WITHOUT PELVIS) 2-3V RIGHT COMPARISON:  None. FINDINGS: There are acute slightly displaced fractures of the right superior and inferior pubic rami and pubic body. Hip appears intact. Pubic symphysis and sacroiliac joints appear intact. IMPRESSION: Right pubic fractures.  Hip appears intact. Electronically Signed   By: Andreas Newport M.D.   On: 08/28/2016 21:43    Positive ROS: All other systems have been reviewed and were otherwise negative with the exception of those  mentioned in the HPI and as above.  Physical Exam: General: Alert, no acute distress  MUSCULOSKELETAL: Right lower extremity: Patient has intact skin. There is no erythema or ecchymosis or swelling. She has palpable pedal pulses, intact sensation light touch and intact motor function distally in both lower extremities. She has no shortening or external rotation right hip.  Right upper extremity: Patient has intact skin. She has mild swelling and ecchymosis in the upper arm. There is no obvious deformity. Patient can flex and extend all 5 digits of the right hand, has intact sensation light touch in palpable radial pulse. She is in a shoulder immobilizer.  Assessment: Right proximal humerus fracture, nondisplaced Right minimally displaced superior pubic rami fracture  Plan: I had along her incision with the patient and her family. Patient is observation status and will be discharged home with home services. Patient will continue current pain management. Patient may be weightbearing as tolerated on the right lower extremity and is nonweightbearing on the right upper extremity. She would benefit from physical and occupational therapy evaluations as an inpatient. She may be discharged home once her pain is controlled and she has been cleared medically. She will follow-up in my office in 2 weeks for reevaluation.    Thornton Park, MD    08/29/2016 1:55 PM

## 2016-08-29 NOTE — Evaluation (Signed)
Occupational Therapy Evaluation Patient Details Name: Leslie Johnston MRN: [id number] DOB: 10/31/1925 Today's Date: 08/29/2016    History of Present Illness Pt is an 83 y.o.femalewho complains of pain in the right shoulder and right hip after a fall at home. Patient lives at home at baseline. She sustained a fall onto her right side home while outdoors. Patient had x-rays taken at [facility] emergency Department overnight which showed a nondisplaced impacted fracture of the right proximal humerus and a minimally displaced fracture of the right superior rami of the pelvis. Patient is seen in her hospital room today. She is hard of hearing but responds appropriately to questions and seems alert and oriented. Patient complains of pain primarily in her right shoulder.  Assessment includes: R proximal humerus fracture, nondisplaced, and R minimally displaced superior pubic rami fracture.  PMH includes HTN and R cataract surgery.   Clinical Impression   Patient seen for OT evaluation this date.  Patient seated EOB on arrival with basin in front of her and complains of nausea.  She vomited several times during session and was limited in what she was able to participate in.  She has her right arm immobilized and is NWB on the arm.  She was able to participate in grooming tasks with minimal to moderate assist.  Increased assistance for bed mobility and did not stand this date due to nausea and vomiting with attempts.  She required mod assist to return to supine position.  She lives at home alone with a supportive family nearby.  She was previously independent with all self care tasks, cooking and cleaning.  She does not drive and her family would either pick up groceries for her or take her shopping. She ambulated without an assistive device.  She now presents with R arm non use due to fracture and immobilization, pain in right arm and pelvis, decreased transfers, mobility, and decreased ability to  perform self care tasks. She would likely benefit from short term rehab prior to returning home given her state and performance this afternoon however if she does not qualify then she will require 24 hour assistance at home.  She would benefit from skilled OT to maximize her safety and independence in daily tasks.      Follow Up Recommendations  SNF    Equipment Recommendations       Recommendations for Other Services       Precautions / Restrictions Precautions Precautions: Fall Required Braces or Orthoses: Sling Restrictions Weight Bearing Restrictions: Yes RUE Weight Bearing: Non weight bearing RLE Weight Bearing: Weight bearing as tolerated      Mobility Bed Mobility Overal bed mobility: Needs Assistance Bed Mobility: Sit to Supine;Supine to Sit     Supine to sit: Mod assist Sit to supine: Mod assist      Transfers Overall transfer level: Needs assistance Equipment used: Hemi-walker Transfers: Sit to/from Stand Sit to Stand: Min assist         General transfer comment: unable due to nausea, see PT note    Balance Overall balance assessment: Needs assistance Sitting-balance support: Feet supported;Single extremity supported Sitting balance-Leahy Scale: Fair     Standing balance support: Single extremity supported Standing balance-Leahy Scale: Poor Standing balance comment: Min instability in standing                           ADL either performed or assessed with clinical judgement   ADL Overall ADL's : Needs  assistance/impaired Eating/Feeding: Minimal assistance Eating/Feeding Details (indicate cue type and reason): right dominant hand immobilized Grooming: Set up;Minimal assistance   Upper Body Bathing: Moderate assistance   Lower Body Bathing: Moderate assistance   Upper Body Dressing : Moderate assistance   Lower Body Dressing: Moderate assistance     Toilet Transfer Details (indicate cue type and reason): unable due to  nausea Toileting- Clothing Manipulation and Hygiene: Moderate assistance         General ADL Comments: Patient was extremely nauseous during session and vomiting repeatedly.  Limited movement due to nausea.       Vision Baseline Vision/History: Wears glasses       Perception     Praxis      Pertinent Vitals/Pain Pain Assessment: 0-10 Faces Pain Scale: Hurts even more Pain Location: RUE, pt unable to use numeric or faces scale, stated pain was "bad" in RUE and "hurts when I move" in R hip/groin Pain Descriptors / Indicators: Aching Pain Intervention(s): Limited activity within patient's tolerance;Monitored during session;Premedicated before session;Repositioned     Hand Dominance Right   Extremity/Trunk Assessment Upper Extremity Assessment Upper Extremity Assessment: RUE deficits/detail;Generalized weakness RUE: Unable to fully assess due to immobilization   Lower Extremity Assessment Lower Extremity Assessment: Generalized weakness   Cervical / Trunk Assessment Cervical / Trunk Assessment: Normal   Communication Communication Communication: HOH   Cognition Arousal/Alertness: Lethargic Behavior During Therapy: WFL for tasks assessed/performed Overall Cognitive Status: History of cognitive impairments - at baseline                                     General Comments       Exercises     Shoulder Instructions      Home Living Family/patient expects to be discharged to:: Private residence Living Arrangements: Alone Available Help at Discharge: Family;Available 24 hours/day Type of Home: House Home Access: Stairs to enter [employer] of Steps: 5 Entrance Stairs-Rails: Left Home Layout: One level     Bathroom Shower/Tub: Teacher, early years/pre: Standard Bathroom Accessibility: No   Home Equipment: Cane - single point          Prior Functioning/Environment Level of Independence: Independent        Comments:  Ind amb without AD, Ind with ADLs, multiple falls in last year per family        OT Problem List: Decreased strength;Decreased knowledge of use of DME or AE;Decreased range of motion;Decreased activity tolerance;Impaired UE functional use;Impaired balance (sitting and/or standing);Pain      OT Treatment/Interventions: Self-care/ADL training;Therapeutic exercise;Patient/family education;Therapeutic activities;DME and/or AE instruction    OT Goals(Current goals can be found in the care plan section) Acute Rehab OT Goals Patient Stated Goal: Patient reports she wants to be able to take care of herself. OT Goal Formulation: With patient/family Time For Goal Achievement: 08/29/16 Potential to Achieve Goals: Good  OT Frequency: Min 1X/week   Barriers to D/C:            Co-evaluation              AM-PAC PT "6 Clicks" Daily Activity     Outcome Measure Help from another person eating meals?: A Lot Help from another person taking care of personal grooming?: A Little Help from another person toileting, which includes using toliet, bedpan, or urinal?: A Lot Help from another person bathing (including washing, rinsing, drying)?: A Lot Help  from another person to put on and taking off regular upper body clothing?: A Lot Help from another person to put on and taking off regular lower body clothing?: A Lot 6 Click Score: 13   End of Session    Activity Tolerance: Patient limited by pain;Other (comment) (nausea) Patient left: in bed;with call bell/phone within reach;with bed alarm set;with family/visitor present  OT Visit Diagnosis: Unsteadiness on feet (R26.81);Muscle weakness (generalized) (M62.81);History of falling (Z91.81);Pain Pain - Right/Left: Right Pain - part of body: Shoulder;Arm;Hand                Time: 8937-3428 OT Time Calculation (min): 32 min Charges:  OT General Charges $OT Visit: 1 Visit OT Evaluation $OT Eval Moderate Complexity: 1 Mod OT Treatments $Self  Care/Home Management : 8-22 mins G-Codes: OT G-codes **NOT FOR INPATIENT CLASS** Functional Assessment Tool Used: AM-PAC 6 Clicks Daily Activity Functional Limitation: Self care Self Care Current Status (J6811): At least 60 percent but less than 80 percent impaired, limited or restricted Self Care Goal Status (X7262): At least 40 percent but less than 60 percent impaired, limited or restricted  Chaos Carlile T Tala Eber, OTR/L, CLT   Graviela Nodal 08/29/2016, 4:41 PM

## 2016-08-29 NOTE — H&P (Signed)
[facility] NAME: Dung Prien    MR#:  [id number]  DATE OF BIRTH:  Oct 01, 1925  DATE OF ADMISSION:  08/28/2016  PRIMARY CARE PHYSICIAN: [name], NP   REQUESTING/REFERRING PHYSICIAN: Owens Shark, MD  CHIEF COMPLAINT:   Chief Complaint  Patient presents with  . Arm Injury    HISTORY OF PRESENT ILLNESS:  Leslie Johnston  is a 83 y.o. female who presents with mechanical fall and subsequent humerus fracture and pelvic fractures. Patient was outside in her yard, states she was dumping water out of the swing that had collected from the rain, and she misstepped and fell to her right side and suffered the fractures.  PAST MEDICAL HISTORY:   Past Medical History:  Diagnosis Date  . Allergy   . Hypertension     PAST SURGICAL HISTORY:   Past Surgical History:  Procedure Laterality Date  . EYE SURGERY     cataract extraction- Right    SOCIAL HISTORY:   Social History  Substance Use Topics  . Smoking status: Never Smoker  . Smokeless tobacco: Never Used  . Alcohol use No    FAMILY HISTORY:   Family History  Problem Relation Age of Onset  . Pancreatic cancer Sister   . Arthritis Maternal Grandmother   . Leukemia Maternal Grandfather   . Asthma Maternal Grandfather     DRUG ALLERGIES:   Allergies  Allergen Reactions  . Amlodipine Swelling  . Losartan Itching  . Penicillins Hives and Other (See Comments)    Has patient had a PCN reaction causing immediate rash, facial/tongue/throat swelling, SOB or lightheadedness with hypotension: No Has patient had a PCN reaction causing severe rash involving mucus membranes or skin necrosis: No Has patient had a PCN reaction that required hospitalization: No Has patient had a PCN reaction occurring within the last 10 years: No If all of the above answers are "NO", then may proceed with Cephalosporin use.     MEDICATIONS AT HOME:   Prior to Admission  medications   Medication Sig Start Date End Date Taking? Authorizing Provider  aspirin 81 MG tablet Take 81 mg by mouth daily.   Yes [provider]  calcium citrate-vitamin D (CITRACAL+D) 315-200 MG-UNIT tablet Take 1 tablet by mouth 2 (two) times daily.   Yes [provider]  fluticasone (FLONASE) 50 MCG/ACT nasal spray Place 2 sprays into both nostrils daily.   Yes [provider]  Multiple Vitamin (MULTIVITAMIN WITH MINERALS) TABS tablet Take 1 tablet by mouth daily.   Yes [provider]    REVIEW OF SYSTEMS:  Review of Systems  Constitutional: Negative for chills, fever, malaise/fatigue and weight loss.  HENT: Negative for ear pain, hearing loss and tinnitus.   Eyes: Negative for blurred vision, double vision, pain and redness.  Respiratory: Negative for cough, hemoptysis and shortness of breath.   Cardiovascular: Negative for chest pain, palpitations, orthopnea and leg swelling.  Gastrointestinal: Negative for abdominal pain, constipation, diarrhea, nausea and vomiting.  Genitourinary: Negative for dysuria, frequency and hematuria.  Musculoskeletal: Positive for joint pain (right arm and pelvis). Negative for back pain and neck pain.  Skin:       No acne, rash, or lesions  Neurological: Negative for dizziness, tremors, focal weakness and weakness.  Endo/Heme/Allergies: Negative for polydipsia. Does not bruise/bleed easily.  Psychiatric/Behavioral: Negative for depression. The patient is not nervous/anxious and does not have insomnia.      VITAL SIGNS:  Vitals:   08/28/16 2052 08/28/16 2234 08/28/16 2300 08/28/16 2330  BP: 137/76 (!) 159/87 (!) 178/81 (!) 184/96  Pulse: 77 91 87 (!) 106  Resp:  (!) 23 (!) 22 (!) 27  Temp: 97.8 F (36.6 C)     TempSrc: Oral     SpO2: 91% 93% 94% 92%  Weight: 59.4 kg (131 lb)     Height: 5\' 7"  (1.702 m)      Wt Readings from Last 3 Encounters:  08/28/16 59.4 kg (131 lb)  06/19/16 56 kg (123 lb 6.4 oz)   04/19/16 56.2 kg (124 lb)    PHYSICAL EXAMINATION:  Physical Exam  Vitals reviewed. Constitutional: She is oriented to person, place, and time. She appears well-developed and well-nourished. No distress.  HENT:  Head: Normocephalic and atraumatic.  Mouth/Throat: Oropharynx is clear and moist.  Eyes: Pupils are equal, round, and reactive to light. Conjunctivae and EOM are normal. No scleral icterus.  Neck: Normal range of motion. Neck supple. No JVD present. No thyromegaly present.  Cardiovascular: Normal rate, regular rhythm and intact distal pulses.  Exam reveals no gallop and no friction rub.   No murmur heard. Respiratory: Effort normal and breath sounds normal. No respiratory distress. She has no wheezes. She has no rales.  GI: Soft. Bowel sounds are normal. She exhibits no distension. There is no tenderness.  Musculoskeletal: Normal range of motion. She exhibits tenderness (Right humerus and pelvis). She exhibits no edema.  No arthritis, no gout  Lymphadenopathy:    She has no cervical adenopathy.  Neurological: She is alert and oriented to person, place, and time. No cranial nerve deficit.  No dysarthria, no aphasia  Skin: Skin is warm and dry. No rash noted. No erythema.  Psychiatric: She has a normal mood and affect. Her behavior is normal. Judgment and thought content normal.    LABORATORY PANEL:   CBC  Recent Labs Lab 08/28/16 2220  WBC 13.2*  HGB 13.6  HCT 42.0  PLT 185   ------------------------------------------------------------------------------------------------------------------  Chemistries   Recent Labs Lab 08/28/16 2220  NA 141  K 3.8  CL 105  CO2 27  GLUCOSE 190*  BUN 18  CREATININE 0.82  CALCIUM 9.6  AST 29  ALT 17  ALKPHOS 66  BILITOT 0.4   ------------------------------------------------------------------------------------------------------------------  Cardiac Enzymes  Recent Labs Lab 08/28/16 2220  TROPONINI <0.03    ------------------------------------------------------------------------------------------------------------------  RADIOLOGY:  Dg Humerus Right  Result Date: 08/28/2016 CLINICAL DATA:  Persistent pain after falling backwards tonight. EXAM: RIGHT HUMERUS - 2+ VIEW COMPARISON:  None. FINDINGS: Acute slightly impacted proximal right humeral fracture. No dislocation. No radiographic findings to suggest a pathologic basis for the fracture. No bone lesion or bony destruction. IMPRESSION: Slightly impacted proximal right humeral fracture.  No dislocation. Electronically Signed   By: Andreas Newport M.D.   On: 08/28/2016 21:41   Dg Hip Unilat W Or Wo Pelvis 2-3 Views Right  Result Date: 08/28/2016 CLINICAL DATA:  Persistent pain after falling backwards tonight. EXAM: DG HIP (WITH OR WITHOUT PELVIS) 2-3V RIGHT COMPARISON:  None. FINDINGS: There are acute slightly displaced fractures of the right superior and inferior pubic rami and pubic body. Hip appears intact. Pubic symphysis and sacroiliac joints appear intact. IMPRESSION: Right pubic fractures.  Hip appears intact. Electronically Signed   By: Andreas Newport M.D.   On: 08/28/2016 21:43    EKG:   Orders placed or performed during the hospital encounter of 08/28/16  . ED EKG  . ED EKG  IMPRESSION AND PLAN:  Principal Problem:   Humerus fracture - when necessary analgesia for pain control, orthopedic consult, PT and OT consults Active Problems:   Pelvic fracture ([place]) - as above   HTN (hypertension) - continue home meds  All the records are reviewed and case discussed with ED provider. Management plans discussed with the patient and/or family.  DVT PROPHYLAXIS: SubQ lovenox  GI PROPHYLAXIS: None  ADMISSION STATUS: Observation  CODE STATUS: Full Code Status History    This patient does not have a recorded code status. Please follow your organizational policy for patients in this situation.      TOTAL TIME TAKING CARE OF  THIS PATIENT: 40 minutes.   Whalen Trompeter De[place]st 08/29/2016, 12:16 AM  [facility]  Office  [phone]  CC: Primary care physician; [name], NP  Note:  This document was prepared using Dragon voice recognition software and may include unintentional dictation errors.

## 2016-08-29 NOTE — Progress Notes (Signed)
Pt alert and oriented. Medicated for pain during the night. Immobilizer to the right arm.

## 2016-08-29 NOTE — Progress Notes (Signed)
PT Cancellation Note  Patient Details Name: Leslie Johnston MRN: [id number] DOB: Aug 31, 1925   Cancelled Treatment:    Reason Eval/Treat Not Completed: Medical issues which prohibited therapy; Pt is pending orthopedic consult for humerus and pelvic fractures. Will hold PT eval pending receipt of orthopedic recommendations.   Linus Salmons PT, DPT 08/29/16, 11:10 AM

## 2016-08-29 NOTE — Care Management Obs Status (Signed)
[facility] NOTIFICATION   Patient Details  Name: Leslie Johnston MRN: [id number] Date of Birth: Apr 06, 1925   Medicare Observation Status Notification Given:  Yes    Jolly Mango, RN 08/29/2016, 9:05 AM

## 2016-08-29 NOTE — Progress Notes (Signed)
Leslie Johnston at [place] NAME: Leslie Johnston    MR#:  [id number]  DATE OF BIRTH:  [birth year]  SUBJECTIVE:   Patient here after a mechanical fall and noted to have a right humerus fracture and also pelvic fracture. Still having significant pain in her right shoulder. Awaiting orthopedic input regarding surgical intervention. Family at bedside.  REVIEW OF SYSTEMS:    Review of Systems  Constitutional: Negative for chills and fever.  HENT: Negative for congestion and tinnitus.   Eyes: Negative for blurred vision and double vision.  Respiratory: Negative for cough, shortness of breath and wheezing.   Cardiovascular: Negative for chest pain, orthopnea and PND.  Gastrointestinal: Negative for abdominal pain, diarrhea, nausea and vomiting.  Genitourinary: Negative for dysuria and hematuria.  Musculoskeletal: Positive for falls and joint pain (Right Shoulder).  Neurological: Negative for dizziness, sensory change and focal weakness.  All other systems reviewed and are negative.   Nutrition: Regular Tolerating Diet: yes Tolerating PT: Await Eval.    DRUG ALLERGIES:   Allergies  Allergen Reactions  . Amlodipine Swelling  . Losartan Itching  . Penicillins Hives and Other (See Comments)    Has patient had a PCN reaction causing immediate rash, facial/tongue/throat swelling, SOB or lightheadedness with hypotension: No Has patient had a PCN reaction causing severe rash involving mucus membranes or skin necrosis: No Has patient had a PCN reaction that required hospitalization: No Has patient had a PCN reaction occurring within the last 10 years: No If all of the above answers are "NO", then may proceed with Cephalosporin use.     VITALS:  Blood pressure (!) 167/102, pulse 100, temperature 97.9 F (36.6 C), temperature source Oral, resp. rate 18, height 5\' 7"  (1.702 m), weight 55.6 kg (122 lb 8 oz), SpO2 94 %.  PHYSICAL EXAMINATION:    Physical Exam  GENERAL:  83 y.o.-year-old patient lying in bed in no acute distress.  EYES: Pupils equal, round, reactive to light and accommodation. No scleral icterus. Extraocular muscles intact.  HEENT: Head atraumatic, normocephalic. Oropharynx and nasopharynx clear. Very hard of hearing NECK:  Supple, no jugular venous distention. No thyroid enlargement, no tenderness.  LUNGS: Normal breath sounds bilaterally, no wheezing, rales, rhonchi. No use of accessory muscles of respiration.  CARDIOVASCULAR: S1, S2 normal. No murmurs, rubs, or gallops.  ABDOMEN: Soft, nontender, nondistended. Bowel sounds present. No organomegaly or mass.  EXTREMITIES: No cyanosis, clubbing or edema b/l.  Right shoulder is in a brace.    NEUROLOGIC: Cranial nerves II through XII are intact. No focal Motor or sensory deficits b/l.  Globally weak.  PSYCHIATRIC: The patient is alert and oriented x 3.  SKIN: No obvious rash, lesion, or ulcer.    LABORATORY PANEL:   CBC  Recent Labs Lab 08/29/16 0431  WBC 9.6  HGB 12.4  HCT 37.6  PLT 162   ------------------------------------------------------------------------------------------------------------------  Chemistries   Recent Labs Lab 08/28/16 2220 08/29/16 0431  NA 141 142  K 3.8 4.1  CL 105 109  CO2 27 27  GLUCOSE 190* 143*  BUN 18 18  CREATININE 0.82 0.83  CALCIUM 9.6 9.2  AST 29  --   ALT 17  --   ALKPHOS 66  --   BILITOT 0.4  --    ------------------------------------------------------------------------------------------------------------------  Cardiac Enzymes  Recent Labs Lab 08/28/16 2220  TROPONINI <0.03   ------------------------------------------------------------------------------------------------------------------  RADIOLOGY:  Dg Humerus Right  Result Date: 08/28/2016 CLINICAL DATA:  Persistent pain after falling backwards  tonight. EXAM: RIGHT HUMERUS - 2+ VIEW COMPARISON:  None. FINDINGS: Acute slightly impacted  proximal right humeral fracture. No dislocation. No radiographic findings to suggest a pathologic basis for the fracture. No bone lesion or bony destruction. IMPRESSION: Slightly impacted proximal right humeral fracture.  No dislocation. Electronically Signed   By: Andreas Newport M.D.   On: 08/28/2016 21:41   Dg Hip Unilat W Or Wo Pelvis 2-3 Views Right  Result Date: 08/28/2016 CLINICAL DATA:  Persistent pain after falling backwards tonight. EXAM: DG HIP (WITH OR WITHOUT PELVIS) 2-3V RIGHT COMPARISON:  None. FINDINGS: There are acute slightly displaced fractures of the right superior and inferior pubic rami and pubic body. Hip appears intact. Pubic symphysis and sacroiliac joints appear intact. IMPRESSION: Right pubic fractures.  Hip appears intact. Electronically Signed   By: Andreas Newport M.D.   On: 08/28/2016 21:43     ASSESSMENT AND PLAN:   83 year old female with past medical history of hypertension who presented to the hospital after a mechanical fall and noted to have a right shoulder fracture and a pelvic fracture.  1. Status post fall and right shoulder and pelvic fracture - continue supportive care with pain control for now. -Await further orthopedic input regarding surgical intervention. -Await physical therapy evaluation.  2. HTN - pt. Is not on any meds at home.  - BP elevated here due to pain possibly. Will consider starting meds if not improving. Asymptomatic.     All the records are reviewed and case discussed with Care Management/Social Worker. Management plans discussed with the patient, family and they are in agreement.  CODE STATUS: Full code  DVT Prophylaxis: Lovenox  TOTAL TIME TAKING CARE OF THIS PATIENT: 25 minutes.   POSSIBLE D/C IN 1-2 DAYS, DEPENDING ON CLINICAL CONDITION.   Henreitta Leber M.D on 08/29/2016 at 12:48 PM  Between 7am to 6pm - Pager - [phone]  After 6pm go to [email or web address] - Proofreader  [facility]  Office  [phone]  CC: Primary care physician; [name], NP

## 2016-08-29 NOTE — NC FL2 (Signed)
[place] LEVEL OF CARE SCREENING TOOL     IDENTIFICATION  Patient Name: KIMMORA RISENHOOVER Birthdate: 03-16-1925 Sex: female Admission Date (Current Location): 08/28/2016  [place] and [place] Number:  Engineering geologist and Address:  [facility], [address], [place], [place] [address]      Provider Number: [id number]  Attending Physician Name and Address:  Henreitta Leber, MD  Relative Name and Phone Number:       Current Level of Care: Hospital Recommended Level of Care: [place] Prior Approval Number:    Date Approved/Denied:   PASRR Number:  [id number])  Discharge Plan: SNF    Current Diagnoses: Patient Active Problem List   Diagnosis Date Noted  . Humerus fracture 08/29/2016  . Pelvic fracture ([place]) 08/29/2016  . Chronic seasonal allergic rhinitis 03/20/2016  . Abnormal chest x-ray 09/19/2015  . HTN (hypertension) 07/27/2014    Orientation RESPIRATION BLADDER Height & Weight     Self, Time, Situation, Place  O2 (2 Liters Oxygen ) Continent Weight: 122 lb 8 oz (55.6 kg) Height:  5\' 7"  (170.2 cm)  BEHAVIORAL SYMPTOMS/MOOD NEUROLOGICAL BOWEL NUTRITION STATUS      Continent Diet (Regular Diet )  AMBULATORY STATUS COMMUNICATION OF NEEDS Skin   Extensive Assist Verbally Normal                       Personal Care Assistance Level of Assistance  Bathing, Feeding, Dressing Bathing Assistance: Limited assistance Feeding assistance: Independent Dressing Assistance: Limited assistance     Functional Limitations Info  Sight, Hearing, Speech Sight Info: Adequate Hearing Info: Impaired Speech Info: Adequate    SPECIAL CARE FACTORS FREQUENCY  PT (By licensed PT), OT (By licensed OT)     PT Frequency:  (5) OT Frequency:  (5)            Contractures      Additional Factors Info  Code Status, Allergies Code Status Info:  (Full Code. ) Allergies Info:  (Amlodipine, Losartan,  Penicillins)           Current Medications (08/29/2016):  This is the current hospital active medication list Current Facility-Administered Medications  Medication Dose Route Frequency Provider Last Rate Last Dose  . acetaminophen (TYLENOL) tablet 650 mg  650 mg Oral Q6H PRN Lance Coon, MD       Or  . acetaminophen (TYLENOL) suppository 650 mg  650 mg Rectal Q6H PRN Lance Coon, MD      . aspirin EC tablet 81 mg  81 mg Oral Daily Lance Coon, MD   81 mg at 08/29/16 0855  . docusate sodium (COLACE) capsule 100 mg  100 mg Oral BID Henreitta Leber, MD      . enoxaparin (LOVENOX) injection 40 mg  40 mg Subcutaneous Q24H Lance Coon, MD      . morphine 2 MG/ML injection 2 mg  2 mg Intravenous Q4H PRN Henreitta Leber, MD      . ondansetron [facility]) tablet 4 mg  4 mg Oral Q6H PRN Lance Coon, MD   4 mg at 08/29/16 1547   Or  . ondansetron (ZOFRAN) injection 4 mg  4 mg Intravenous Q6H PRN Lance Coon, MD      . oxyCODONE (Oxy IR/ROXICODONE) immediate release tablet 5 mg  5 mg Oral Q4H PRN Lance Coon, MD   5 mg at 08/29/16 1326     Discharge Medications: Please see discharge summary for a list  of discharge medications.  Relevant Imaging Results:  Relevant Lab Results:   Additional Information  (SSN: [id number])  Iziah Cates, Veronia Beets, LCSW

## 2016-08-29 NOTE — Care Management Note (Addendum)
Case Management Note  Patient Details  Name: Leslie Johnston MRN: [id number] Date of Birth: 1925/06/10  Subjective/Objective:  Observation patient. Met with patient and her children at bedside to present observation notice. Admitted following a fall outside while cleaning walter off her swing. Humerus fracture and pelvic fracture. Patient lives at home alone. Prior to admission she was independent and used no DME. She has a large family that is in and out checking on her. Ortho consult pending. Will follow progression and assist as needed. Family sates they are willing to take her home and care for her if needed.                     Action/Plan:   Expected Discharge Date:  08/31/16               Expected Discharge Plan:     In-House Referral:     Discharge planning Services  CM Consult  Post Acute Care Choice:    Choice offered to:     DME Arranged:    DME Agency:     HH Arranged:    HH Agency:     Status of Service:  In process, will continue to follow  If discussed at Long Length of Stay Meetings, dates discussed:    Additional Comments:  Jolly Mango, RN 08/29/2016, 9:06 AM

## 2016-08-29 NOTE — Clinical Social Work Note (Signed)
Clinical Social Work Assessment  Patient Details  Name: Leslie Johnston MRN: [id number] Date of Birth: December 13, 1925  Date of referral:  08/29/16               Reason for consult:  Facility Placement                Permission sought to share information with:    Permission granted to share information::     Name::        Agency::     Relationship::     Contact Information:     Housing/Transportation Living arrangements for the past 2 months:  Single Family Home Source of Information:  Patient, Adult Children Patient Interpreter Needed:  None Criminal Activity/Legal Involvement Pertinent to Current Situation/Hospitalization:  No - Comment as needed Significant Relationships:  Adult Children, Other Family Members Lives with:  Self Do you feel safe going back to the place where you live?  Yes Need for family participation in patient care:  Yes (Comment)  Care giving concerns:  Patient lives alone in [place].    Social Worker assessment / plan:  Holiday representative (CSW) reviewed chart and noted that PT is recommending SNF. Patient is under medicare observation status. CSW met with patient and her daughter [name] and son Herbie [place] were at bedside. Patient was alert and oriented X4 and was laying in the bed. Patient was hard of hearing and was feeling nauseous. CSW introduced self and explained role of CSW department. Patient reported that she lives alone. Per [name] and Herbie [place] patient has 4 adult children that live near by and several grandchildren that also live near by. CSW explained that medicare will not pay for SNF because patient is under observation and medicare requires a 3 night qualifying inpatient stay at a hospital in order to pay for SNF. CSW discussed private pay SNF options or home with home health. Per son and daughter patient will prefer to go home with home health and will likely now want to pay privately for SNF. Plan is for home health. CSW will continue to follow and assist  as needed.   Employment status:  Retired Forensic scientist:  Medicare PT Recommendations:  [place] / Referral to community resources:  Other (Comment Required) (RN case manager to arrange home health. )  Patient/Family's Response to care:  Patient and family chose [facility].   Patient/Family's Understanding of and Emotional Response to Diagnosis, Current Treatment, and Prognosis:  Patient and family were very pleasant and thanked CSW for visit.   Emotional Assessment Appearance:  Appears stated age Attitude/Demeanor/Rapport:    Affect (typically observed):  Pleasant, Quiet Orientation:  Oriented to Self, Oriented to Place, Oriented to  Time, Oriented to Situation Alcohol / Substance use:  Not Applicable Psych involvement (Current and /or in the community):  No (Comment)  Discharge Needs  Concerns to be addressed:  Discharge Planning Concerns Readmission within the last 30 days:  No Current discharge risk:  Dependent with Mobility Barriers to Discharge:  Continued Medical Work up   [employer], Veronia Beets, LCSW 08/29/2016, 5:15 PM

## 2016-08-29 NOTE — Progress Notes (Signed)
OT Cancellation Note  Patient Details Name: Leslie Johnston MRN: [id number] DOB: 04-Jul-1925   Cancelled Treatment:    Reason Eval/Treat Not Completed: Medical issues which prohibited therapy (Pt. waiting orthopedic consult with Dr. Marry Guan for humerus and pelvic fractures. Will eval when appropriate.)  Harrel Carina, MS, OTR/L 08/29/2016, 9:51 AM

## 2016-08-30 DIAGNOSIS — S42309A Unspecified fracture of shaft of humerus, unspecified arm, initial encounter for closed fracture: Secondary | ICD-10-CM | POA: Diagnosis not present

## 2016-08-30 DIAGNOSIS — I10 Essential (primary) hypertension: Secondary | ICD-10-CM | POA: Diagnosis not present

## 2016-08-30 DIAGNOSIS — S42201A Unspecified fracture of upper end of right humerus, initial encounter for closed fracture: Secondary | ICD-10-CM | POA: Diagnosis not present

## 2016-08-30 DIAGNOSIS — S329XXA Fracture of unspecified parts of lumbosacral spine and pelvis, initial encounter for closed fracture: Secondary | ICD-10-CM | POA: Diagnosis not present

## 2016-08-30 MED ORDER — OXYCODONE HCL 5 MG PO TABS: 5.0000 mg | ORAL_TABLET | ORAL | 0 refills | Status: DC | PRN

## 2016-08-30 NOTE — Progress Notes (Signed)
Physical Therapy Treatment Patient Details Name: Leslie Johnston MRN: [id number] DOB: 14-Sep-1925 Today's Date: 08/30/2016    History of Present Illness 83 y.o.femalewho complains of pain in the right shoulder and right hip after a fall at home. Patient lives at home at baseline. She sustained a fall onto her right side home while outdoors. X-rays showed a nondisplaced impacted fracture of the right proximal humerus and a minimally displaced fracture of the right superior rami of the pelvis. She is hard of hearing but responds appropriately to questions and seems alert and oriented. Patient complains of pain primarily in her right shoulder.  Assessment includes: R proximal humerus fracture, nondisplaced, and R minimally displaced superior pubic rami fracture.  PMH includes HTN and R cataract surgery.    PT Comments    Pt did much better with PT today and showed ability to do some actual ambulation (~75 ft with HW, needed close cuing and supervision).  She had some forward lean with the effort but was able to self adjust this when reminded.  She was reliant on the hemiwalker (attempts at [facility] only revealed she is not ready for a narrower base AD).  Pt has very supportive family who will be able to stay with her 24/7 initially.  Instructed them on stair negotiating with rails.  Follow Up Recommendations  Supervision/Assistance - 24 hour;Home health PT     Equipment Recommendations  Other (comment) (hemiwalker)    Recommendations for Other Services       Precautions / Restrictions Precautions Precautions: Fall Required Braces or Orthoses:  (shoulder immobilizer) Restrictions Weight Bearing Restrictions: Yes RUE Weight Bearing: Non weight bearing RLE Weight Bearing: Weight bearing as tolerated    Mobility  Bed Mobility Overal bed mobility: Needs Assistance Bed Mobility: Supine to Sit     Supine to sit: Mod assist        Transfers Overall transfer level: Needs  assistance Equipment used: Hemi-walker Transfers: Sit to/from Stand Sit to Stand: Min assist         General transfer comment: Pt did well with attempt at standing, needed only light assist, did need more cuing for set up and sequencing  Ambulation/Gait Ambulation/Gait assistance: Min assist Ambulation Distance (Feet): 75 Feet Assistive device: Hemi-walker       General Gait Details: Slow but relatively safe ambulation.  She needed regular verbal and direct tactile cues to use HW appropriately and to avoid obstacles but she had no LOBs.  She did fatigue with the effort but HR remained steady (~100-110) and O2 low 90s on 2 liters.   Stairs            Wheelchair Mobility    Modified Rankin (Stroke Patients Only)       Balance Overall balance assessment: Modified Independent Sitting-balance support: Feet supported;Single extremity supported Sitting balance-Leahy Scale: Good Sitting balance - Comments: Pt needed assist more to insure she did not try to get up w/o AD, etc than to keep balance   Standing balance support: Single extremity supported Standing balance-Leahy Scale: Fair Standing balance comment: Pt reliant on HW/HHA for balance but statically did well, some decrease with activities                            Cognition Arousal/Alertness: Awake/alert Behavior During Therapy: WFL for tasks assessed/performed Overall Cognitive Status: History of cognitive impairments - at baseline  Exercises General Exercises - Lower Extremity Ankle Circles/Pumps: AROM;10 reps Heel Slides: Strengthening;5 reps Hip ABduction/ADduction: Strengthening;5 reps Straight Leg Raises: Strengthening;5 reps    General Comments        Pertinent Vitals/Pain Pain Score:  (unable to rate, no excessive grimmacing, etc)    Home Living                      Prior Function            PT Goals (current  goals can now be found in the care plan section) Progress towards PT goals: Progressing toward goals    Frequency    7X/week      PT Plan Current plan remains appropriate    Co-evaluation              AM-PAC PT "6 Clicks" Daily Activity  Outcome Measure  Difficulty turning over in bed (including adjusting bedclothes, sheets and blankets)?: Unable Difficulty moving from lying on back to sitting on the side of the bed? : Unable Difficulty sitting down on and standing up from a chair with arms (e.g., wheelchair, bedside commode, etc,.)?: Unable Help needed moving to and from a bed to chair (including a wheelchair)?: A Little Help needed walking in hospital room?: A Little Help needed climbing 3-5 steps with a railing? : A Lot 6 Click Score: 11    End of Session Equipment Utilized During Treatment: Gait belt;Oxygen Activity Tolerance: Patient tolerated treatment well;Patient limited by fatigue Patient left: with chair alarm set;with call bell/phone within reach;with family/visitor present Nurse Communication: Mobility status PT Visit Diagnosis: Difficulty in walking, not elsewhere classified (R26.2);Muscle weakness (generalized) (M62.81)     Time: 6384-5364 PT Time Calculation (min) (ACUTE ONLY): 31 min  Charges:  $Gait Training: 8-22 mins $Therapeutic Exercise: 8-22 mins                    G Codes:       Kreg Shropshire, DPT 08/30/2016, 10:48 AM

## 2016-08-30 NOTE — Discharge Summary (Signed)
[facility] NAME: Leslie Johnston    MR#:  [id number]  DATE OF BIRTH:  08-Jul-1925  DATE OF ADMISSION:  08/28/2016 ADMITTING PHYSICIAN: Lance Coon, MD  DATE OF DISCHARGE: 08/30/2016 11:33 AM  PRIMARY CARE PHYSICIAN: [name], NP    ADMISSION DIAGNOSIS:  rt shoulder and rt hip inj  DISCHARGE DIAGNOSIS:  Principal Problem:   Humerus fracture Active Problems:   HTN (hypertension)   Pelvic fracture ([place])   SECONDARY DIAGNOSIS:   Past Medical History:  Diagnosis Date  . Allergy   . Hypertension     HOSPITAL COURSE:   83 year old female with past medical history of hypertension who presented to the hospital after a mechanical fall and noted to have a right shoulder fracture and a pelvic fracture.  1. Status post fall and right shoulder and pelvic fracture - patient was seen by orthopedics and they did not recommend any acute surgical intervention. Patient was placed in a sling for her right shoulder fracture. She'll follow-up with orthopedics in the next 2 weeks. -Home health physical therapy was arranged for her prior to discharge. She was given some oxycodone as needed for her pain..  2. HTN - pt. Is not on any meds at home.  - BP remained mildy elevated but I will let her PCP as outpatient decide if she needs to be on meds as it could be all pain/White coat Hypertension.   Pt. Was discharged home with Home health services.   DISCHARGE CONDITIONS:   Stable.   CONSULTS OBTAINED:  Treatment Team:  Thornton Park, MD  DRUG ALLERGIES:   Allergies  Allergen Reactions  . Amlodipine Swelling  . Losartan Itching  . Penicillins Hives and Other (See Comments)    Has patient had a PCN reaction causing immediate rash, facial/tongue/throat swelling, SOB or lightheadedness with hypotension: No Has patient had a PCN reaction causing severe rash involving mucus membranes or skin necrosis: No Has patient had a  PCN reaction that required hospitalization: No Has patient had a PCN reaction occurring within the last 10 years: No If all of the above answers are "NO", then may proceed with Cephalosporin use.     DISCHARGE MEDICATIONS:   Allergies as of 08/30/2016      Reactions   Amlodipine Swelling   Losartan Itching   Penicillins Hives, Other (See Comments)   Has patient had a PCN reaction causing immediate rash, facial/tongue/throat swelling, SOB or lightheadedness with hypotension: No Has patient had a PCN reaction causing severe rash involving mucus membranes or skin necrosis: No Has patient had a PCN reaction that required hospitalization: No Has patient had a PCN reaction occurring within the last 10 years: No If all of the above answers are "NO", then may proceed with Cephalosporin use.      Medication List    TAKE these medications   aspirin 81 MG tablet Take 81 mg by mouth daily.   calcium citrate-vitamin D 315-200 MG-UNIT tablet Commonly known as:  CITRACAL+D Take 1 tablet by mouth 2 (two) times daily.   fluticasone 50 MCG/ACT nasal spray Commonly known as:  FLONASE Place 2 sprays into both nostrils daily.   multivitamin with minerals Tabs tablet Take 1 tablet by mouth daily.   oxyCODONE 5 MG immediate release tablet Commonly known as:  Oxy IR/ROXICODONE Take 1 tablet (5 mg total) by mouth every 4 (four) hours as needed for moderate pain.  Discharge Care Instructions        Start     Ordered   08/30/16 0000  oxyCODONE (OXY IR/ROXICODONE) 5 MG immediate release tablet  Every 4 hours PRN     08/30/16 1050   08/30/16 0000  Activity as tolerated - No restrictions     08/30/16 1050   08/30/16 0000  Diet general     08/30/16 1050        DISCHARGE INSTRUCTIONS:   DIET:  Regular diet  DISCHARGE CONDITION:  Stable  ACTIVITY:  Activity as tolerated  OXYGEN:  Home Oxygen: No.   Oxygen Delivery: room air  DISCHARGE LOCATION:  Home with Home  Health PT, RN.     If you experience worsening of your admission symptoms, develop shortness of breath, life threatening emergency, suicidal or homicidal thoughts you must seek medical attention immediately by calling 911 or calling your MD immediately  if symptoms less severe.  You Must read complete instructions/literature along with all the possible adverse reactions/side effects for all the Medicines you take and that have been prescribed to you. Take any new Medicines after you have completely understood and accpet all the possible adverse reactions/side effects.   Please note  You were cared for by a hospitalist during your hospital stay. If you have any questions about your discharge medications or the care you received while you were in the hospital after you are discharged, you can call the unit and asked to speak with the hospitalist on call if the hospitalist that took care of you is not available. Once you are discharged, your primary care physician will handle any further medical issues. Please note that NO REFILLS for any discharge medications will be authorized once you are discharged, as it is imperative that you return to your primary care physician (or establish a relationship with a primary care physician if you do not have one) for your aftercare needs so that they can reassess your need for medications and monitor your lab values.     Today   Still has some right shoulder pain but it's improved.  Family at bedside. Will d/c home with Home Health today.   VITAL SIGNS:  Blood pressure (!) 150/111, pulse (!) 101, temperature 99.8 F (37.7 C), temperature source Oral, resp. rate 18, height 5\' 7"  (1.702 m), weight 55.6 kg (122 lb 8 oz), SpO2 91 %.  I/O:   Intake/Output Summary (Last 24 hours) at 08/30/16 1458 Last data filed at 08/30/16 0800  Gross per 24 hour  Intake              240 ml  Output                0 ml  Net              240 ml    PHYSICAL EXAMINATION:    GENERAL:  83 y.o.-year-old patient lying in bed in no acute distress.  EYES: Pupils equal, round, reactive to light and accommodation. No scleral icterus. Extraocular muscles intact.  HEENT: Head atraumatic, normocephalic. Oropharynx and nasopharynx clear. Very hard of hearing NECK:  Supple, no jugular venous distention. No thyroid enlargement, no tenderness.  LUNGS: Normal breath sounds bilaterally, no wheezing, rales, rhonchi. No use of accessory muscles of respiration.  CARDIOVASCULAR: S1, S2 normal. No murmurs, rubs, or gallops.  ABDOMEN: Soft, nontender, nondistended. Bowel sounds present. No organomegaly or mass.  EXTREMITIES: No cyanosis, clubbing or edema b/l.  Right shoulder is in a  sling   NEUROLOGIC: Cranial nerves II through XII are intact. No focal Motor or sensory deficits b/l.  Globally weak.  PSYCHIATRIC: The patient is alert and oriented x 3.  SKIN: No obvious rash, lesion, or ulcer.   DATA REVIEW:   CBC  Recent Labs Lab 08/29/16 0431  WBC 9.6  HGB 12.4  HCT 37.6  PLT 162    Chemistries   Recent Labs Lab 08/28/16 2220 08/29/16 0431  NA 141 142  K 3.8 4.1  CL 105 109  CO2 27 27  GLUCOSE 190* 143*  BUN 18 18  CREATININE 0.82 0.83  CALCIUM 9.6 9.2  AST 29  --   ALT 17  --   ALKPHOS 66  --   BILITOT 0.4  --     Cardiac Enzymes  Recent Labs Lab 08/28/16 2220  TROPONINI <0.03    Microbiology Results  No results found for this or any previous visit.  RADIOLOGY:  Dg Humerus Right  Result Date: 08/28/2016 CLINICAL DATA:  Persistent pain after falling backwards tonight. EXAM: RIGHT HUMERUS - 2+ VIEW COMPARISON:  None. FINDINGS: Acute slightly impacted proximal right humeral fracture. No dislocation. No radiographic findings to suggest a pathologic basis for the fracture. No bone lesion or bony destruction. IMPRESSION: Slightly impacted proximal right humeral fracture.  No dislocation. Electronically Signed   By: Andreas Newport M.D.   On:  08/28/2016 21:41   Dg Hip Unilat W Or Wo Pelvis 2-3 Views Right  Result Date: 08/28/2016 CLINICAL DATA:  Persistent pain after falling backwards tonight. EXAM: DG HIP (WITH OR WITHOUT PELVIS) 2-3V RIGHT COMPARISON:  None. FINDINGS: There are acute slightly displaced fractures of the right superior and inferior pubic rami and pubic body. Hip appears intact. Pubic symphysis and sacroiliac joints appear intact. IMPRESSION: Right pubic fractures.  Hip appears intact. Electronically Signed   By: Andreas Newport M.D.   On: 08/28/2016 21:43      Management plans discussed with the patient, family and they are in agreement.  CODE STATUS:  Code Status History    Date Active Date Inactive Code Status Order ID Comments User Context   08/29/2016  1:00 AM 08/30/2016  2:33 PM Full Code [id number]  Lance Coon, MD Inpatient      TOTAL TIME TAKING CARE OF THIS PATIENT: 40 minutes.    Henreitta Leber M.D on 08/30/2016 at 2:58 PM  Between 7am to 6pm - Pager - [phone]  After 6pm go to [email or web address] - Proofreader  [facility]  Office  [phone]  CC: Primary care physician; [name], NP

## 2016-08-30 NOTE — Care Management Note (Addendum)
Case Management Note  Patient Details  Name: NOLENE ROCKS MRN: [id number] Date of Birth: 24-Feb-1925  Subjective/Objective: Met with patient and multiple children at bedside. Spoke with daughter, [name] [phone]). Please call her to set up home health. Offered choice of home health agencies and they prefer [facility]. Family requesting a nurse due to her O2 being low and HTN. Will need nursing and OT,  PT. Declined HHA. Ordered a walker and bsc from [facility]. Family will be staying assisting patient after discharge . PCP is Cassell Smiles                     Spoke with Dr. Verdell Carmine. Patient will be discharging today  Action/Plan: [facility] for SN, PT, OT, walker and bsc.   Expected Discharge Date:  08/31/16               Expected Discharge Plan:  [place]  In-House Referral:  Clinical Social Work  Discharge planning Services  CM Consult  Post Acute Care Choice:  Home Health Choice offered to:  Adult Children  DME Arranged:  Bedside commode, Walker rolling DME Agency:     HH Arranged:  RN, PT HH Agency:  [facility]  Status of Service:  In process, will continue to follow  If discussed at Long Length of Stay Meetings, dates discussed:    Additional Comments:  Jolly Mango, RN 08/30/2016, 8:57 AM

## 2016-08-30 NOTE — Progress Notes (Signed)
Discharge instructions giving to patients family. Patient and family verbalized understanding. Patient has no complaints or SS of distress.

## 2016-08-30 NOTE — Care Management Note (Signed)
Case Management Note  Patient Details  Name: Leslie Johnston MRN: [id number] Date of Birth: 29-Jul-1925  Subjective/Objective:   [facility] notified of discharge  today.                  Action/Plan:   Expected Discharge Date:  08/31/16               Expected Discharge Plan:  [place]  In-House Referral:  Clinical Social Work  Discharge planning Services  CM Consult  Post Acute Care Choice:  Home Health Choice offered to:  Adult Children  DME Arranged:  Bedside commode, Walker rolling DME Agency:     HH Arranged:  RN, PT HH Agency:  [facility]  Status of Service:  Completed, signed off  If discussed at [place] of Stay Meetings, dates discussed:    Additional Comments:  Jolly Mango, RN 08/30/2016, 9:58 AM

## 2016-08-31 ENCOUNTER — Telehealth: Payer: Self-pay

## 2016-08-31 DIAGNOSIS — W19XXXD Unspecified fall, subsequent encounter: Secondary | ICD-10-CM | POA: Diagnosis not present

## 2016-08-31 DIAGNOSIS — R2689 Other abnormalities of gait and mobility: Secondary | ICD-10-CM | POA: Diagnosis not present

## 2016-08-31 DIAGNOSIS — S42291D Other displaced fracture of upper end of right humerus, subsequent encounter for fracture with routine healing: Secondary | ICD-10-CM | POA: Diagnosis not present

## 2016-08-31 DIAGNOSIS — S3282XD Multiple fractures of pelvis without disruption of pelvic ring, subsequent encounter for fracture with routine healing: Secondary | ICD-10-CM | POA: Diagnosis not present

## 2016-08-31 DIAGNOSIS — Z7982 Long term (current) use of aspirin: Secondary | ICD-10-CM | POA: Diagnosis not present

## 2016-08-31 DIAGNOSIS — I10 Essential (primary) hypertension: Secondary | ICD-10-CM | POA: Diagnosis not present

## 2016-08-31 NOTE — Telephone Encounter (Signed)
Called for [place] with patients caregiver [place] Management Follow-up Telephone Call  Date discharged? 08/30/2016  How have you been since you were released from the hospital? Pretty good, working with the therapist for ambulation   Do you understand why you were in the hospital? yes  Do you understand the discharge instructions? yes  Where were you discharged to? Home   Items Reviewed: Medications reviewed: YES Allergies reviewed: YES Dietary changes reviewed: YES Referrals reviewed: YES   Functional Questionnaire:  Activities of Daily Living (ADLs):   States they are independent in the following: feeding herself with left hand States they require assistance with the following: assistance with walking-PT,bathing, some assistance with feeding.  Any transportation issues/concerns?: no   Any patient concerns? Needs 24 hour care and needs FMLA form filled out for caregiver to assist patient. She is faxing this over today.   Confirmed importance and date/time of follow-up visits scheduled Yes Provider Appointment booked with Lissa Merlin for 09/04/2016  At 11:00am  Confirmed with patient if condition begins to worsen call PCP or go to the ER.  Patient was given the office number and encouraged to call back with question or concerns.  : YES

## 2016-09-03 DIAGNOSIS — S42291D Other displaced fracture of upper end of right humerus, subsequent encounter for fracture with routine healing: Secondary | ICD-10-CM | POA: Diagnosis not present

## 2016-09-03 DIAGNOSIS — S3282XD Multiple fractures of pelvis without disruption of pelvic ring, subsequent encounter for fracture with routine healing: Secondary | ICD-10-CM | POA: Diagnosis not present

## 2016-09-03 DIAGNOSIS — R2689 Other abnormalities of gait and mobility: Secondary | ICD-10-CM | POA: Diagnosis not present

## 2016-09-03 DIAGNOSIS — I10 Essential (primary) hypertension: Secondary | ICD-10-CM | POA: Diagnosis not present

## 2016-09-03 DIAGNOSIS — W19XXXD Unspecified fall, subsequent encounter: Secondary | ICD-10-CM | POA: Diagnosis not present

## 2016-09-03 DIAGNOSIS — Z7982 Long term (current) use of aspirin: Secondary | ICD-10-CM | POA: Diagnosis not present

## 2016-09-04 ENCOUNTER — Inpatient Hospital Stay: Payer: Medicare Other | Admitting: Nurse Practitioner

## 2016-09-05 DIAGNOSIS — S3282XD Multiple fractures of pelvis without disruption of pelvic ring, subsequent encounter for fracture with routine healing: Secondary | ICD-10-CM | POA: Diagnosis not present

## 2016-09-05 DIAGNOSIS — W19XXXD Unspecified fall, subsequent encounter: Secondary | ICD-10-CM | POA: Diagnosis not present

## 2016-09-05 DIAGNOSIS — I10 Essential (primary) hypertension: Secondary | ICD-10-CM | POA: Diagnosis not present

## 2016-09-05 DIAGNOSIS — S42291D Other displaced fracture of upper end of right humerus, subsequent encounter for fracture with routine healing: Secondary | ICD-10-CM | POA: Diagnosis not present

## 2016-09-05 DIAGNOSIS — Z7982 Long term (current) use of aspirin: Secondary | ICD-10-CM | POA: Diagnosis not present

## 2016-09-05 DIAGNOSIS — R2689 Other abnormalities of gait and mobility: Secondary | ICD-10-CM | POA: Diagnosis not present

## 2016-09-06 DIAGNOSIS — I10 Essential (primary) hypertension: Secondary | ICD-10-CM | POA: Diagnosis not present

## 2016-09-06 DIAGNOSIS — S3282XD Multiple fractures of pelvis without disruption of pelvic ring, subsequent encounter for fracture with routine healing: Secondary | ICD-10-CM | POA: Diagnosis not present

## 2016-09-06 DIAGNOSIS — S42291D Other displaced fracture of upper end of right humerus, subsequent encounter for fracture with routine healing: Secondary | ICD-10-CM | POA: Diagnosis not present

## 2016-09-06 DIAGNOSIS — Z7982 Long term (current) use of aspirin: Secondary | ICD-10-CM | POA: Diagnosis not present

## 2016-09-06 DIAGNOSIS — R2689 Other abnormalities of gait and mobility: Secondary | ICD-10-CM | POA: Diagnosis not present

## 2016-09-06 DIAGNOSIS — W19XXXD Unspecified fall, subsequent encounter: Secondary | ICD-10-CM | POA: Diagnosis not present

## 2016-09-07 DIAGNOSIS — S3282XD Multiple fractures of pelvis without disruption of pelvic ring, subsequent encounter for fracture with routine healing: Secondary | ICD-10-CM | POA: Diagnosis not present

## 2016-09-07 DIAGNOSIS — I10 Essential (primary) hypertension: Secondary | ICD-10-CM | POA: Diagnosis not present

## 2016-09-07 DIAGNOSIS — R2689 Other abnormalities of gait and mobility: Secondary | ICD-10-CM | POA: Diagnosis not present

## 2016-09-07 DIAGNOSIS — Z7982 Long term (current) use of aspirin: Secondary | ICD-10-CM | POA: Diagnosis not present

## 2016-09-07 DIAGNOSIS — W19XXXD Unspecified fall, subsequent encounter: Secondary | ICD-10-CM | POA: Diagnosis not present

## 2016-09-07 DIAGNOSIS — S42291D Other displaced fracture of upper end of right humerus, subsequent encounter for fracture with routine healing: Secondary | ICD-10-CM | POA: Diagnosis not present

## 2016-09-10 DIAGNOSIS — S3282XD Multiple fractures of pelvis without disruption of pelvic ring, subsequent encounter for fracture with routine healing: Secondary | ICD-10-CM | POA: Diagnosis not present

## 2016-09-10 DIAGNOSIS — W19XXXD Unspecified fall, subsequent encounter: Secondary | ICD-10-CM | POA: Diagnosis not present

## 2016-09-10 DIAGNOSIS — Z7982 Long term (current) use of aspirin: Secondary | ICD-10-CM | POA: Diagnosis not present

## 2016-09-10 DIAGNOSIS — R2689 Other abnormalities of gait and mobility: Secondary | ICD-10-CM | POA: Diagnosis not present

## 2016-09-10 DIAGNOSIS — I10 Essential (primary) hypertension: Secondary | ICD-10-CM | POA: Diagnosis not present

## 2016-09-10 DIAGNOSIS — S42291D Other displaced fracture of upper end of right humerus, subsequent encounter for fracture with routine healing: Secondary | ICD-10-CM | POA: Diagnosis not present

## 2016-09-11 ENCOUNTER — Encounter: Payer: Self-pay | Admitting: Nurse Practitioner

## 2016-09-11 ENCOUNTER — Ambulatory Visit (INDEPENDENT_AMBULATORY_CARE_PROVIDER_SITE_OTHER): Payer: Medicare Other | Admitting: Nurse Practitioner

## 2016-09-11 VITALS — BP 155/67 | Temp 98.2°F | Ht 67.0 in | Wt 130.4 lb

## 2016-09-11 DIAGNOSIS — M80029A Age-related osteoporosis with current pathological fracture, unspecified humerus, initial encounter for fracture: Secondary | ICD-10-CM

## 2016-09-11 DIAGNOSIS — Z09 Encounter for follow-up examination after completed treatment for conditions other than malignant neoplasm: Secondary | ICD-10-CM | POA: Diagnosis not present

## 2016-09-11 DIAGNOSIS — Z780 Asymptomatic menopausal state: Secondary | ICD-10-CM

## 2016-09-11 DIAGNOSIS — R600 Localized edema: Secondary | ICD-10-CM

## 2016-09-11 DIAGNOSIS — R32 Unspecified urinary incontinence: Secondary | ICD-10-CM

## 2016-09-11 LAB — POCT URINALYSIS DIPSTICK
Bilirubin, UA: NEGATIVE
Blood, UA: NEGATIVE
Glucose, UA: NEGATIVE
Ketones, UA: NEGATIVE
Leukocytes, UA: NEGATIVE
Nitrite, UA: NEGATIVE
Protein, UA: NEGATIVE
Spec Grav, UA: 1.01 (ref 1.010–1.025)
Urobilinogen, UA: 0.2 E.U./dL
pH, UA: 5 (ref 5.0–8.0)

## 2016-09-11 NOTE — Patient Instructions (Addendum)
Leslie Johnston, Thank you for coming in to clinic today.  1. For your legs:  - keep elevated - keep walking - move your feet - Continue your therapy.  2. For your arm and pelvis: - Continue your therapy.  - Screen for osteoporosis:  Schedule your bone density scan for about 2 months from today.  THen, schedule an appointment with me in about 3 months.  Your order has been placed.  Call the Scheduling phone number at [phone] to schedule your bone density scan at your convenience.  You can choose to go to either location listed below.  Let the scheduler know which location you prefer.  [facility] [place], [place] [address] [address], [place] [address]    Please schedule a follow-up appointment with Cassell Smiles, AGNP. Return if symptoms worsen or fail to improve and in 3 months for osteoporosis after your bone density scan.  If you have any other questions or concerns, please feel free to call the clinic or send a message through [place]. You may also schedule an earlier appointment if necessary.  You will receive a survey after today's visit either digitally by e-mail or paper by [name]. Your experiences and feedback matter to [name].  Please respond so we know how we are doing as we provide care for you.   Cassell Smiles, DNP, AGNP-BC Adult Gerontology Nurse Practitioner [facility], [facility]   Bone Densitometry Bone densitometry is an imaging test that uses a special X-ray to measure the amount of calcium and other minerals in your bones (bone density). This test is also known as a bone mineral density test or dual-energy X-ray absorptiometry (DXA). The test can measure bone density at your hip and your spine. It is similar to having a regular X-ray. You may have this test to:  Diagnose a condition that causes weak or thin bones  (osteoporosis).  Predict your risk of a broken bone (fracture).  Determine how well osteoporosis treatment is working.  Tell a health care provider about:  Any allergies you have.  All medicines you are taking, including vitamins, herbs, eye drops, creams, and over-the-counter medicines.  Any problems you or family members have had with anesthetic medicines.  Any blood disorders you have.  Any surgeries you have had.  Any medical conditions you have.  Possibility of pregnancy.  Any other medical test you had within the previous 14 days that used contrast material. What are the risks? Generally, this is a safe procedure. However, problems can occur and may include the following:  This test exposes you to a very small amount of radiation.  The risks of radiation exposure may be greater to unborn children.  What happens before the procedure?  Do not take any calcium supplements for 24 hours before having the test. You can otherwise eat and drink what you usually do.  Take off all metal jewelry, eyeglasses, dental appliances, and any other metal objects. What happens during the procedure?  You may lie on an exam table. There will be an X-ray generator below you and an imaging device above you.  Other devices, such as boxes or braces, may be used to position your body properly for the scan.  You will need to lie still while the machine slowly scans your body.  The images will show up on a computer monitor. What happens after the procedure? You may need more  testing at a later time. This information is not intended to replace advice given to you by your health care provider. Make sure you discuss any questions you have with your health care provider. Document Released: 01/10/2004 Document Revised: 05/26/2015 Document Reviewed: 05/28/2013 Elsevier Interactive Patient Education  2018 [employer].

## 2016-09-11 NOTE — Progress Notes (Signed)
Subjective:    Patient ID: Leslie Johnston, female    DOB: 1926/01/01, 83 y.o.   MRN: [id number]  Leslie Johnston is a 83 y.o. female presenting on 09/11/2016 for Hospitalization Follow-up (Humerus fracture x 2 weeks ago )  [place]: [place] Date of Admission: 08/28/16 Date of Discharge: 08/30/16 Transitions of care telephone call: 08/31/16  Reason for Admission: Fall w/ fracture of right humerus and pelvis Primary (+Secondary) Diagnosis: Humerus Fracture (pelvic fracture, hypertension)  [facility] H&P and Discharge Summary have been reviewed - Patient presents today 11 days after recent hospitalization.  - Brief summary of recent course: Pt fell w/ fracture of humerus and pelvis not requiring surgery.  Treated w/ IV fluids and opioid pain medication prn. - Today reports overall has done well after discharge. Symptoms of pain and severe bruising are resolving.  Still having "soreness" w/ some pelvic pain and shoulder pain.  Takes tylenol for pain control.   - New medications on discharge: oxycodone - Changes to current meds on discharge: none - Home Care arranged prior to discharge for rehab - Agency: [facility] for RN, PT - Hypertensive during hospitalization: Pt w/o previous hypertension.  Likely related to increased levels of pain in hospital.  BP at home is 110/69, 128/70.  Currently asymptomatic.  Concerns today: swelling in feet.  Improves overnight w/ feet propped.  Osteoporosis Screening: Does not recall most recent DEXA scan.  Pt is taking calcium and vitamin D regularly and has been for many years.  Does have family history of osteoporosis w/ fracture in mother and siblings.  Urinary frequency: Pt notes some urinary frequency statinno UTI, no burning.  Pt denies fever, chills, sweats, incontinence.  Social History  Substance Use Topics  . Smoking status: Never Smoker  . Smokeless tobacco: Never Used  . Alcohol use  No    Review of Systems Per HPI unless specifically indicated above  Outpatient Encounter Prescriptions as of 09/11/2016  Medication Sig  . acetaminophen (TYLENOL) 500 MG tablet Take 500 mg by mouth every 6 (six) hours as needed.  Marland Kitchen aspirin 81 MG tablet Take 81 mg by mouth daily.  . calcium citrate-vitamin D (CITRACAL+D) 315-200 MG-UNIT tablet Take 1 tablet by mouth 2 (two) times daily.  . Multiple Vitamin (MULTIVITAMIN WITH MINERALS) TABS tablet Take 1 tablet by mouth daily.  Marland Kitchen oxyCODONE (OXY IR/ROXICODONE) 5 MG immediate release tablet Take 1 tablet (5 mg total) by mouth every 4 (four) hours as needed for moderate pain.  . fluticasone (FLONASE) 50 MCG/ACT nasal spray Place 2 sprays into both nostrils daily.   No facility-administered encounter medications on file as of 09/11/2016.       Objective:    BP (!) 155/67   Temp 98.2 F (36.8 C) (Oral)   Ht 5\' 7"  (1.702 m)   Wt 130 lb 6.4 oz (59.1 kg)   BMI 20.42 kg/m   Wt Readings from Last 3 Encounters:  09/11/16 130 lb 6.4 oz (59.1 kg)  08/29/16 122 lb 8 oz (55.6 kg)  06/19/16 123 lb 6.4 oz (56 kg)     Physical Exam  Constitutional: She is oriented to person, place, and time. She appears well-developed and well-nourished. No distress.  HENT:  Head: Normocephalic and atraumatic.  Decreased hearing bilaterally w/o hearing aids  Cardiovascular: Normal rate and regular rhythm.   Pulmonary/Chest: Effort normal and breath sounds normal. No respiratory distress.  Musculoskeletal:       Right  shoulder: She exhibits decreased range of motion and bony tenderness. She exhibits no tenderness and no swelling.  - R shoulder w/ limited ROM and arm in sling appropriately applied. - hips symmetrical w/o elevation of one side when standing. - Pt walking w/ tripod walker.  Short steps but w/ good stability and balance. - Caregiver is using gait belt w/ standby assist.  Spine w/o bony tenderness. Mild kyphosis present.  Neurological: She is  alert and oriented to person, place, and time. No cranial nerve deficit.  Skin: Skin is warm, dry and intact. Ecchymosis noted.     Psychiatric: She has a normal mood and affect. Her behavior is normal. Judgment and thought content normal.  Vitals reviewed.      Results for orders placed or performed in visit on 09/11/16  POCT Urinalysis Dipstick  Result Value Ref Range   Color, UA yellow    Clarity, UA clear    Glucose, UA neg    Bilirubin, UA neg    Ketones, UA neg    Spec Grav, UA 1.010 1.010 - 1.025   Blood, UA neg    pH, UA 5.0 5.0 - 8.0   Protein, UA neg    Urobilinogen, UA 0.2 0.2 or 1.0 E.U./dL   Nitrite, UA neg    Leukocytes, UA Negative Negative      Assessment & Plan:   Problem List Items Addressed This Visit      Musculoskeletal and Integument   Postmenopausal osteoporosis with pathological fracture of humerus    Likely osteoporosis w/ family history and recent fracture of right humerus and pelvis after fall onto right side. No recent DEXA scan  Plan: 1. Evaluate for osteoporosis w/ DEXA 2. Continue calcium and vitamin D. 3. Discussed possible treatment of osteoporosis.  Daughter agrees should proceed, but pt will need more information about treatment options.  Pt states does not want to start fosamax. 4. Encouraged dental exam and care prior to treatment. 5. Discussed home fall prevention.  Continue w/ home PT. 6. Follow up after DEXA scan completed at next follow up appointment.      Relevant Orders   DG Bone Density    Other Visit Diagnoses    Hospital discharge follow-up    -  Primary Hospitalized for fall w/ fracture of humerus and pelvis.  Pt doing well w/ rehab.  No acute concerns.  Hypertension during inpatient stay likely situational and response to pain.    Plan: 1. Continue home care. 2. Evaluate for osteoporosis as above. 3. Follow up as needed of changes in current symptoms or increase in pain level.    Urinary incontinence in female      Likely related to pain and decreased mobility.  No infectious cause.    Plan: 1. Continue to monitor symptoms.  If needed can meet again for acute visit specific to urinary frequency. 2. POCT dipstick today.  Normal. 3. Follow up as needed.  Encouraged regular scheduled bathroom breaks.    Relevant Orders   POCT Urinalysis Dipstick (Completed)   Bilateral lower extremity edema     Likely related to immobility and injury.  Pt not wearing compression socks. No evidence of DVT today (Wells score -1), but pt at increased risk w/ immobility and recent fracture.  Plan: 1. Ordered compression stockings. 2. Encouraged ambulation and exercises as recommended by PT. 3. Keep legs elevated. 4. If persists after return of mobility can do venous studies.  Follow up as needed.  Reviewed s/sx of  DVT and return criteria.   Relevant Orders   Compression stockings   Asymptomatic postmenopausal estrogen deficiency     See osteoporosis above for A/P   Relevant Orders   DG Bone Density      Meds ordered this encounter  Medications  . acetaminophen (TYLENOL) 500 MG tablet    Sig: Take 500 mg by mouth every 6 (six) hours as needed.    I have reviewed the discharge medication list, and have reconciled the current and discharge medications today.  Follow up plan: Return if symptoms worsen or fail to improve and in 3 months for osteoporosis after your bone density scan.  Cassell Smiles, DNP, AGPCNP-BC Adult Gerontology Primary Care Nurse Practitioner [facility] 09/13/2016, 12:14 PM

## 2016-09-12 DIAGNOSIS — Z7982 Long term (current) use of aspirin: Secondary | ICD-10-CM | POA: Diagnosis not present

## 2016-09-12 DIAGNOSIS — S42291D Other displaced fracture of upper end of right humerus, subsequent encounter for fracture with routine healing: Secondary | ICD-10-CM | POA: Diagnosis not present

## 2016-09-12 DIAGNOSIS — I10 Essential (primary) hypertension: Secondary | ICD-10-CM | POA: Diagnosis not present

## 2016-09-12 DIAGNOSIS — S3282XD Multiple fractures of pelvis without disruption of pelvic ring, subsequent encounter for fracture with routine healing: Secondary | ICD-10-CM | POA: Diagnosis not present

## 2016-09-12 DIAGNOSIS — W19XXXD Unspecified fall, subsequent encounter: Secondary | ICD-10-CM | POA: Diagnosis not present

## 2016-09-12 DIAGNOSIS — R2689 Other abnormalities of gait and mobility: Secondary | ICD-10-CM | POA: Diagnosis not present

## 2016-09-13 DIAGNOSIS — M80029A Age-related osteoporosis with current pathological fracture, unspecified humerus, initial encounter for fracture: Secondary | ICD-10-CM | POA: Insufficient documentation

## 2016-09-13 NOTE — Assessment & Plan Note (Addendum)
Likely osteoporosis w/ family history and recent fracture of right humerus and pelvis after fall onto right side. No recent DEXA scan  Plan: 1. Evaluate for osteoporosis w/ DEXA 2. Continue calcium and vitamin D. 3. Discussed possible treatment of osteoporosis.  Daughter agrees should proceed, but pt will need more information about treatment options.  Pt states does not want to start fosamax. 4. Encouraged dental exam and care prior to treatment. 5. Discussed home fall prevention.  Continue w/ home PT. 6. Follow up after DEXA scan completed at next follow up appointment.

## 2016-09-17 DIAGNOSIS — R2689 Other abnormalities of gait and mobility: Secondary | ICD-10-CM | POA: Diagnosis not present

## 2016-09-17 DIAGNOSIS — I10 Essential (primary) hypertension: Secondary | ICD-10-CM | POA: Diagnosis not present

## 2016-09-17 DIAGNOSIS — S42291D Other displaced fracture of upper end of right humerus, subsequent encounter for fracture with routine healing: Secondary | ICD-10-CM | POA: Diagnosis not present

## 2016-09-17 DIAGNOSIS — W19XXXD Unspecified fall, subsequent encounter: Secondary | ICD-10-CM | POA: Diagnosis not present

## 2016-09-17 DIAGNOSIS — S3282XD Multiple fractures of pelvis without disruption of pelvic ring, subsequent encounter for fracture with routine healing: Secondary | ICD-10-CM | POA: Diagnosis not present

## 2016-09-17 DIAGNOSIS — Z7982 Long term (current) use of aspirin: Secondary | ICD-10-CM | POA: Diagnosis not present

## 2016-09-18 DIAGNOSIS — I10 Essential (primary) hypertension: Secondary | ICD-10-CM | POA: Diagnosis not present

## 2016-09-18 DIAGNOSIS — S3282XD Multiple fractures of pelvis without disruption of pelvic ring, subsequent encounter for fracture with routine healing: Secondary | ICD-10-CM | POA: Diagnosis not present

## 2016-09-18 DIAGNOSIS — S42291D Other displaced fracture of upper end of right humerus, subsequent encounter for fracture with routine healing: Secondary | ICD-10-CM | POA: Diagnosis not present

## 2016-09-18 DIAGNOSIS — Z7982 Long term (current) use of aspirin: Secondary | ICD-10-CM | POA: Diagnosis not present

## 2016-09-18 DIAGNOSIS — W19XXXD Unspecified fall, subsequent encounter: Secondary | ICD-10-CM | POA: Diagnosis not present

## 2016-09-18 DIAGNOSIS — R2689 Other abnormalities of gait and mobility: Secondary | ICD-10-CM | POA: Diagnosis not present

## 2016-09-20 ENCOUNTER — Other Ambulatory Visit: Payer: Self-pay | Admitting: Nurse Practitioner

## 2016-09-20 DIAGNOSIS — Z7982 Long term (current) use of aspirin: Secondary | ICD-10-CM | POA: Diagnosis not present

## 2016-09-20 DIAGNOSIS — I10 Essential (primary) hypertension: Secondary | ICD-10-CM | POA: Diagnosis not present

## 2016-09-20 DIAGNOSIS — S42291D Other displaced fracture of upper end of right humerus, subsequent encounter for fracture with routine healing: Secondary | ICD-10-CM | POA: Diagnosis not present

## 2016-09-20 DIAGNOSIS — S3282XD Multiple fractures of pelvis without disruption of pelvic ring, subsequent encounter for fracture with routine healing: Secondary | ICD-10-CM | POA: Diagnosis not present

## 2016-09-20 DIAGNOSIS — S42201D Unspecified fracture of upper end of right humerus, subsequent encounter for fracture with routine healing: Secondary | ICD-10-CM | POA: Diagnosis not present

## 2016-09-20 DIAGNOSIS — R2243 Localized swelling, mass and lump, lower limb, bilateral: Secondary | ICD-10-CM

## 2016-09-20 DIAGNOSIS — R2689 Other abnormalities of gait and mobility: Secondary | ICD-10-CM | POA: Diagnosis not present

## 2016-09-20 DIAGNOSIS — S32591D Other specified fracture of right pubis, subsequent encounter for fracture with routine healing: Secondary | ICD-10-CM | POA: Diagnosis not present

## 2016-09-20 DIAGNOSIS — W19XXXD Unspecified fall, subsequent encounter: Secondary | ICD-10-CM | POA: Diagnosis not present

## 2016-09-20 MED ORDER — FUROSEMIDE 20 MG PO TABS: 10.0000 mg | ORAL_TABLET | Freq: Every day | ORAL | 0 refills | Status: DC

## 2016-09-20 NOTE — Progress Notes (Signed)
Patient daughter called: - leg swelling persists despite compression socks. - Orthopedic MD suggested to pt and family to contact clinic to start fluid pills.  Also agree and was an option after last appointment. - START furosemide 10 mg once daily x 7 days.  - May stop using compression socks if stays mobile and has resolved swelling.

## 2016-09-21 DIAGNOSIS — Z7982 Long term (current) use of aspirin: Secondary | ICD-10-CM | POA: Diagnosis not present

## 2016-09-21 DIAGNOSIS — S42291D Other displaced fracture of upper end of right humerus, subsequent encounter for fracture with routine healing: Secondary | ICD-10-CM | POA: Diagnosis not present

## 2016-09-21 DIAGNOSIS — R2689 Other abnormalities of gait and mobility: Secondary | ICD-10-CM | POA: Diagnosis not present

## 2016-09-21 DIAGNOSIS — W19XXXD Unspecified fall, subsequent encounter: Secondary | ICD-10-CM | POA: Diagnosis not present

## 2016-09-21 DIAGNOSIS — I10 Essential (primary) hypertension: Secondary | ICD-10-CM | POA: Diagnosis not present

## 2016-09-21 DIAGNOSIS — S3282XD Multiple fractures of pelvis without disruption of pelvic ring, subsequent encounter for fracture with routine healing: Secondary | ICD-10-CM | POA: Diagnosis not present

## 2016-09-24 DIAGNOSIS — Z7982 Long term (current) use of aspirin: Secondary | ICD-10-CM | POA: Diagnosis not present

## 2016-09-24 DIAGNOSIS — W19XXXD Unspecified fall, subsequent encounter: Secondary | ICD-10-CM | POA: Diagnosis not present

## 2016-09-24 DIAGNOSIS — R2689 Other abnormalities of gait and mobility: Secondary | ICD-10-CM | POA: Diagnosis not present

## 2016-09-24 DIAGNOSIS — S42291D Other displaced fracture of upper end of right humerus, subsequent encounter for fracture with routine healing: Secondary | ICD-10-CM | POA: Diagnosis not present

## 2016-09-24 DIAGNOSIS — S3282XD Multiple fractures of pelvis without disruption of pelvic ring, subsequent encounter for fracture with routine healing: Secondary | ICD-10-CM | POA: Diagnosis not present

## 2016-09-24 DIAGNOSIS — I10 Essential (primary) hypertension: Secondary | ICD-10-CM | POA: Diagnosis not present

## 2016-09-25 DIAGNOSIS — Z7982 Long term (current) use of aspirin: Secondary | ICD-10-CM | POA: Diagnosis not present

## 2016-09-25 DIAGNOSIS — R2689 Other abnormalities of gait and mobility: Secondary | ICD-10-CM | POA: Diagnosis not present

## 2016-09-25 DIAGNOSIS — W19XXXD Unspecified fall, subsequent encounter: Secondary | ICD-10-CM | POA: Diagnosis not present

## 2016-09-25 DIAGNOSIS — I10 Essential (primary) hypertension: Secondary | ICD-10-CM | POA: Diagnosis not present

## 2016-09-25 DIAGNOSIS — S3282XD Multiple fractures of pelvis without disruption of pelvic ring, subsequent encounter for fracture with routine healing: Secondary | ICD-10-CM | POA: Diagnosis not present

## 2016-09-25 DIAGNOSIS — S42291D Other displaced fracture of upper end of right humerus, subsequent encounter for fracture with routine healing: Secondary | ICD-10-CM | POA: Diagnosis not present

## 2016-09-26 DIAGNOSIS — Z7982 Long term (current) use of aspirin: Secondary | ICD-10-CM | POA: Diagnosis not present

## 2016-09-26 DIAGNOSIS — S42291D Other displaced fracture of upper end of right humerus, subsequent encounter for fracture with routine healing: Secondary | ICD-10-CM | POA: Diagnosis not present

## 2016-09-26 DIAGNOSIS — I10 Essential (primary) hypertension: Secondary | ICD-10-CM | POA: Diagnosis not present

## 2016-09-26 DIAGNOSIS — W19XXXD Unspecified fall, subsequent encounter: Secondary | ICD-10-CM | POA: Diagnosis not present

## 2016-09-26 DIAGNOSIS — R2689 Other abnormalities of gait and mobility: Secondary | ICD-10-CM | POA: Diagnosis not present

## 2016-09-26 DIAGNOSIS — S3282XD Multiple fractures of pelvis without disruption of pelvic ring, subsequent encounter for fracture with routine healing: Secondary | ICD-10-CM | POA: Diagnosis not present

## 2016-09-27 DIAGNOSIS — S42291D Other displaced fracture of upper end of right humerus, subsequent encounter for fracture with routine healing: Secondary | ICD-10-CM | POA: Diagnosis not present

## 2016-09-27 DIAGNOSIS — S3282XD Multiple fractures of pelvis without disruption of pelvic ring, subsequent encounter for fracture with routine healing: Secondary | ICD-10-CM | POA: Diagnosis not present

## 2016-09-27 DIAGNOSIS — W19XXXD Unspecified fall, subsequent encounter: Secondary | ICD-10-CM | POA: Diagnosis not present

## 2016-09-27 DIAGNOSIS — Z7982 Long term (current) use of aspirin: Secondary | ICD-10-CM | POA: Diagnosis not present

## 2016-09-27 DIAGNOSIS — I10 Essential (primary) hypertension: Secondary | ICD-10-CM | POA: Diagnosis not present

## 2016-09-27 DIAGNOSIS — R2689 Other abnormalities of gait and mobility: Secondary | ICD-10-CM | POA: Diagnosis not present

## 2016-10-01 DIAGNOSIS — R2689 Other abnormalities of gait and mobility: Secondary | ICD-10-CM | POA: Diagnosis not present

## 2016-10-01 DIAGNOSIS — W19XXXD Unspecified fall, subsequent encounter: Secondary | ICD-10-CM | POA: Diagnosis not present

## 2016-10-01 DIAGNOSIS — S42291D Other displaced fracture of upper end of right humerus, subsequent encounter for fracture with routine healing: Secondary | ICD-10-CM | POA: Diagnosis not present

## 2016-10-01 DIAGNOSIS — Z7982 Long term (current) use of aspirin: Secondary | ICD-10-CM | POA: Diagnosis not present

## 2016-10-01 DIAGNOSIS — S3282XD Multiple fractures of pelvis without disruption of pelvic ring, subsequent encounter for fracture with routine healing: Secondary | ICD-10-CM | POA: Diagnosis not present

## 2016-10-01 DIAGNOSIS — I10 Essential (primary) hypertension: Secondary | ICD-10-CM | POA: Diagnosis not present

## 2016-10-03 DIAGNOSIS — S3282XD Multiple fractures of pelvis without disruption of pelvic ring, subsequent encounter for fracture with routine healing: Secondary | ICD-10-CM | POA: Diagnosis not present

## 2016-10-03 DIAGNOSIS — W19XXXD Unspecified fall, subsequent encounter: Secondary | ICD-10-CM | POA: Diagnosis not present

## 2016-10-03 DIAGNOSIS — S42291D Other displaced fracture of upper end of right humerus, subsequent encounter for fracture with routine healing: Secondary | ICD-10-CM | POA: Diagnosis not present

## 2016-10-03 DIAGNOSIS — Z7982 Long term (current) use of aspirin: Secondary | ICD-10-CM | POA: Diagnosis not present

## 2016-10-03 DIAGNOSIS — R2689 Other abnormalities of gait and mobility: Secondary | ICD-10-CM | POA: Diagnosis not present

## 2016-10-03 DIAGNOSIS — I10 Essential (primary) hypertension: Secondary | ICD-10-CM | POA: Diagnosis not present

## 2016-10-04 ENCOUNTER — Telehealth: Payer: Self-pay

## 2016-10-04 DIAGNOSIS — Z7982 Long term (current) use of aspirin: Secondary | ICD-10-CM | POA: Diagnosis not present

## 2016-10-04 DIAGNOSIS — S42291D Other displaced fracture of upper end of right humerus, subsequent encounter for fracture with routine healing: Secondary | ICD-10-CM | POA: Diagnosis not present

## 2016-10-04 DIAGNOSIS — I10 Essential (primary) hypertension: Secondary | ICD-10-CM | POA: Diagnosis not present

## 2016-10-04 DIAGNOSIS — W19XXXD Unspecified fall, subsequent encounter: Secondary | ICD-10-CM | POA: Diagnosis not present

## 2016-10-04 DIAGNOSIS — R2689 Other abnormalities of gait and mobility: Secondary | ICD-10-CM | POA: Diagnosis not present

## 2016-10-04 DIAGNOSIS — S3282XD Multiple fractures of pelvis without disruption of pelvic ring, subsequent encounter for fracture with routine healing: Secondary | ICD-10-CM | POA: Diagnosis not present

## 2016-10-04 NOTE — Telephone Encounter (Signed)
I received a phone call from pt daughter concerning the pt Tylenol consumption. She states that her mother fell  about 5 weeks ago and broke her arm, shoulder, and pelvic.  She was taking Oxycodone , but discontinued that several weeks ago. The daughter is concern because the Tylenol bottle states she should not take the medication longer than 10 days and her mother been on the medication for 35 days. She is current taking 1250 MG of Tylenol BID. The daughter did state she have an f/u appt scheduled w/ her surgeon on Oct 18th. Please advise

## 2016-10-04 NOTE — Telephone Encounter (Signed)
This is still safe to take by medical guidance longer than 10 days.    Urine leakage may be avoided by scheduling bathroom visits about every 3-4 hours.  It will take her longer and more effort for mobilization after her pelvic fracture and this may be contributing to her leakage.

## 2016-10-05 ENCOUNTER — Other Ambulatory Visit: Payer: Self-pay | Admitting: Nurse Practitioner

## 2016-10-05 DIAGNOSIS — I10 Essential (primary) hypertension: Secondary | ICD-10-CM

## 2016-10-05 MED ORDER — LISINOPRIL 10 MG PO TABS: 10.0000 mg | ORAL_TABLET | Freq: Every day | ORAL | 3 refills | Status: DC

## 2016-10-05 NOTE — Telephone Encounter (Signed)
The pt daughter [name] was notified. She informed me that her sister [name] was on her way to work and I could rely the information to her.

## 2016-10-05 NOTE — Progress Notes (Signed)
Pt's daughter brings BP readings to clinic and reports BP has remained elevated.  Is also always elevated when PT comes to home for therapy.  BP readings 160/90, 180-190/95-99  Pain control: acetaminophen 1250 mg bid, tramadol 50 mg 1-2 x daily (but pt not taking often)  Pt states is not in pain, but BP indicates pain may be better controlled w/ more frequent pain med dosing.  Discussed w/ pt daughter.  Consider celebrex low dose or meloxicam in future if needed.  START lisinopril 10 mg once daily.  Lab check 2 weeks for BMP.

## 2016-10-08 ENCOUNTER — Telehealth: Payer: Self-pay | Admitting: Nurse Practitioner

## 2016-10-08 DIAGNOSIS — S3282XD Multiple fractures of pelvis without disruption of pelvic ring, subsequent encounter for fracture with routine healing: Secondary | ICD-10-CM | POA: Diagnosis not present

## 2016-10-08 DIAGNOSIS — I10 Essential (primary) hypertension: Secondary | ICD-10-CM

## 2016-10-08 DIAGNOSIS — W19XXXD Unspecified fall, subsequent encounter: Secondary | ICD-10-CM | POA: Diagnosis not present

## 2016-10-08 DIAGNOSIS — R2689 Other abnormalities of gait and mobility: Secondary | ICD-10-CM | POA: Diagnosis not present

## 2016-10-08 DIAGNOSIS — S42291D Other displaced fracture of upper end of right humerus, subsequent encounter for fracture with routine healing: Secondary | ICD-10-CM | POA: Diagnosis not present

## 2016-10-08 DIAGNOSIS — Z7982 Long term (current) use of aspirin: Secondary | ICD-10-CM | POA: Diagnosis not present

## 2016-10-08 MED ORDER — HYDROCHLOROTHIAZIDE 12.5 MG PO TABS: 12.5000 mg | ORAL_TABLET | Freq: Every day | ORAL | 2 refills | Status: DC

## 2016-10-08 NOTE — Telephone Encounter (Signed)
[facility] called [phone]    BP  180/100  BP 156/80  BP  160/80  She did take her BP medication on Friday

## 2016-10-08 NOTE — Telephone Encounter (Signed)
The pt daughter was notified. No questions or concerns. 

## 2016-10-08 NOTE — Telephone Encounter (Signed)
Pt did recently start lisinopril.  Will need to continue lisinopril 10 mg once daily. - Also start hydrochlorothiazide 12.5 mg once daily. - Continue BP checks and work on good pain control   Goal BP < 130/80 Follow up in clinic 4 weeks.

## 2016-10-09 DIAGNOSIS — I10 Essential (primary) hypertension: Secondary | ICD-10-CM | POA: Diagnosis not present

## 2016-10-09 DIAGNOSIS — W19XXXD Unspecified fall, subsequent encounter: Secondary | ICD-10-CM | POA: Diagnosis not present

## 2016-10-09 DIAGNOSIS — S3282XD Multiple fractures of pelvis without disruption of pelvic ring, subsequent encounter for fracture with routine healing: Secondary | ICD-10-CM | POA: Diagnosis not present

## 2016-10-09 DIAGNOSIS — R2689 Other abnormalities of gait and mobility: Secondary | ICD-10-CM | POA: Diagnosis not present

## 2016-10-09 DIAGNOSIS — Z7982 Long term (current) use of aspirin: Secondary | ICD-10-CM | POA: Diagnosis not present

## 2016-10-09 DIAGNOSIS — S42291D Other displaced fracture of upper end of right humerus, subsequent encounter for fracture with routine healing: Secondary | ICD-10-CM | POA: Diagnosis not present

## 2016-10-10 DIAGNOSIS — W19XXXD Unspecified fall, subsequent encounter: Secondary | ICD-10-CM | POA: Diagnosis not present

## 2016-10-10 DIAGNOSIS — Z7982 Long term (current) use of aspirin: Secondary | ICD-10-CM | POA: Diagnosis not present

## 2016-10-10 DIAGNOSIS — S42291D Other displaced fracture of upper end of right humerus, subsequent encounter for fracture with routine healing: Secondary | ICD-10-CM | POA: Diagnosis not present

## 2016-10-10 DIAGNOSIS — S3282XD Multiple fractures of pelvis without disruption of pelvic ring, subsequent encounter for fracture with routine healing: Secondary | ICD-10-CM | POA: Diagnosis not present

## 2016-10-10 DIAGNOSIS — I10 Essential (primary) hypertension: Secondary | ICD-10-CM | POA: Diagnosis not present

## 2016-10-10 DIAGNOSIS — R2689 Other abnormalities of gait and mobility: Secondary | ICD-10-CM | POA: Diagnosis not present

## 2016-10-11 DIAGNOSIS — R2689 Other abnormalities of gait and mobility: Secondary | ICD-10-CM | POA: Diagnosis not present

## 2016-10-11 DIAGNOSIS — S3282XD Multiple fractures of pelvis without disruption of pelvic ring, subsequent encounter for fracture with routine healing: Secondary | ICD-10-CM | POA: Diagnosis not present

## 2016-10-11 DIAGNOSIS — Z7982 Long term (current) use of aspirin: Secondary | ICD-10-CM | POA: Diagnosis not present

## 2016-10-11 DIAGNOSIS — W19XXXD Unspecified fall, subsequent encounter: Secondary | ICD-10-CM | POA: Diagnosis not present

## 2016-10-11 DIAGNOSIS — S42291D Other displaced fracture of upper end of right humerus, subsequent encounter for fracture with routine healing: Secondary | ICD-10-CM | POA: Diagnosis not present

## 2016-10-11 DIAGNOSIS — I10 Essential (primary) hypertension: Secondary | ICD-10-CM | POA: Diagnosis not present

## 2016-10-15 DIAGNOSIS — I10 Essential (primary) hypertension: Secondary | ICD-10-CM | POA: Diagnosis not present

## 2016-10-15 DIAGNOSIS — S3282XD Multiple fractures of pelvis without disruption of pelvic ring, subsequent encounter for fracture with routine healing: Secondary | ICD-10-CM | POA: Diagnosis not present

## 2016-10-15 DIAGNOSIS — Z7982 Long term (current) use of aspirin: Secondary | ICD-10-CM | POA: Diagnosis not present

## 2016-10-15 DIAGNOSIS — W19XXXD Unspecified fall, subsequent encounter: Secondary | ICD-10-CM | POA: Diagnosis not present

## 2016-10-15 DIAGNOSIS — S42291D Other displaced fracture of upper end of right humerus, subsequent encounter for fracture with routine healing: Secondary | ICD-10-CM | POA: Diagnosis not present

## 2016-10-15 DIAGNOSIS — R2689 Other abnormalities of gait and mobility: Secondary | ICD-10-CM | POA: Diagnosis not present

## 2016-10-16 DIAGNOSIS — S3282XD Multiple fractures of pelvis without disruption of pelvic ring, subsequent encounter for fracture with routine healing: Secondary | ICD-10-CM | POA: Diagnosis not present

## 2016-10-16 DIAGNOSIS — W19XXXD Unspecified fall, subsequent encounter: Secondary | ICD-10-CM | POA: Diagnosis not present

## 2016-10-16 DIAGNOSIS — Z7982 Long term (current) use of aspirin: Secondary | ICD-10-CM | POA: Diagnosis not present

## 2016-10-16 DIAGNOSIS — R2689 Other abnormalities of gait and mobility: Secondary | ICD-10-CM | POA: Diagnosis not present

## 2016-10-16 DIAGNOSIS — S42291D Other displaced fracture of upper end of right humerus, subsequent encounter for fracture with routine healing: Secondary | ICD-10-CM | POA: Diagnosis not present

## 2016-10-16 DIAGNOSIS — I10 Essential (primary) hypertension: Secondary | ICD-10-CM | POA: Diagnosis not present

## 2016-10-17 DIAGNOSIS — S42291D Other displaced fracture of upper end of right humerus, subsequent encounter for fracture with routine healing: Secondary | ICD-10-CM | POA: Diagnosis not present

## 2016-10-17 DIAGNOSIS — S3282XD Multiple fractures of pelvis without disruption of pelvic ring, subsequent encounter for fracture with routine healing: Secondary | ICD-10-CM | POA: Diagnosis not present

## 2016-10-17 DIAGNOSIS — I10 Essential (primary) hypertension: Secondary | ICD-10-CM | POA: Diagnosis not present

## 2016-10-17 DIAGNOSIS — Z7982 Long term (current) use of aspirin: Secondary | ICD-10-CM | POA: Diagnosis not present

## 2016-10-17 DIAGNOSIS — W19XXXD Unspecified fall, subsequent encounter: Secondary | ICD-10-CM | POA: Diagnosis not present

## 2016-10-17 DIAGNOSIS — R2689 Other abnormalities of gait and mobility: Secondary | ICD-10-CM | POA: Diagnosis not present

## 2016-10-18 DIAGNOSIS — S42291D Other displaced fracture of upper end of right humerus, subsequent encounter for fracture with routine healing: Secondary | ICD-10-CM | POA: Diagnosis not present

## 2016-10-18 DIAGNOSIS — S42201D Unspecified fracture of upper end of right humerus, subsequent encounter for fracture with routine healing: Secondary | ICD-10-CM | POA: Diagnosis not present

## 2016-10-18 DIAGNOSIS — R2689 Other abnormalities of gait and mobility: Secondary | ICD-10-CM | POA: Diagnosis not present

## 2016-10-18 DIAGNOSIS — I10 Essential (primary) hypertension: Secondary | ICD-10-CM | POA: Diagnosis not present

## 2016-10-18 DIAGNOSIS — S32591D Other specified fracture of right pubis, subsequent encounter for fracture with routine healing: Secondary | ICD-10-CM | POA: Diagnosis not present

## 2016-10-18 DIAGNOSIS — Z7982 Long term (current) use of aspirin: Secondary | ICD-10-CM | POA: Diagnosis not present

## 2016-10-18 DIAGNOSIS — W19XXXD Unspecified fall, subsequent encounter: Secondary | ICD-10-CM | POA: Diagnosis not present

## 2016-10-18 DIAGNOSIS — S3282XD Multiple fractures of pelvis without disruption of pelvic ring, subsequent encounter for fracture with routine healing: Secondary | ICD-10-CM | POA: Diagnosis not present

## 2016-10-22 DIAGNOSIS — I10 Essential (primary) hypertension: Secondary | ICD-10-CM | POA: Diagnosis not present

## 2016-10-22 DIAGNOSIS — S42291D Other displaced fracture of upper end of right humerus, subsequent encounter for fracture with routine healing: Secondary | ICD-10-CM | POA: Diagnosis not present

## 2016-10-22 DIAGNOSIS — Z7982 Long term (current) use of aspirin: Secondary | ICD-10-CM | POA: Diagnosis not present

## 2016-10-22 DIAGNOSIS — S3282XD Multiple fractures of pelvis without disruption of pelvic ring, subsequent encounter for fracture with routine healing: Secondary | ICD-10-CM | POA: Diagnosis not present

## 2016-10-22 DIAGNOSIS — R2689 Other abnormalities of gait and mobility: Secondary | ICD-10-CM | POA: Diagnosis not present

## 2016-10-22 DIAGNOSIS — W19XXXD Unspecified fall, subsequent encounter: Secondary | ICD-10-CM | POA: Diagnosis not present

## 2016-10-23 DIAGNOSIS — Z7982 Long term (current) use of aspirin: Secondary | ICD-10-CM | POA: Diagnosis not present

## 2016-10-23 DIAGNOSIS — W19XXXD Unspecified fall, subsequent encounter: Secondary | ICD-10-CM | POA: Diagnosis not present

## 2016-10-23 DIAGNOSIS — I10 Essential (primary) hypertension: Secondary | ICD-10-CM | POA: Diagnosis not present

## 2016-10-23 DIAGNOSIS — S42291D Other displaced fracture of upper end of right humerus, subsequent encounter for fracture with routine healing: Secondary | ICD-10-CM | POA: Diagnosis not present

## 2016-10-23 DIAGNOSIS — S3282XD Multiple fractures of pelvis without disruption of pelvic ring, subsequent encounter for fracture with routine healing: Secondary | ICD-10-CM | POA: Diagnosis not present

## 2016-10-23 DIAGNOSIS — R2689 Other abnormalities of gait and mobility: Secondary | ICD-10-CM | POA: Diagnosis not present

## 2016-10-24 DIAGNOSIS — I10 Essential (primary) hypertension: Secondary | ICD-10-CM | POA: Diagnosis not present

## 2016-10-24 DIAGNOSIS — R2689 Other abnormalities of gait and mobility: Secondary | ICD-10-CM | POA: Diagnosis not present

## 2016-10-24 DIAGNOSIS — W19XXXD Unspecified fall, subsequent encounter: Secondary | ICD-10-CM | POA: Diagnosis not present

## 2016-10-24 DIAGNOSIS — S42291D Other displaced fracture of upper end of right humerus, subsequent encounter for fracture with routine healing: Secondary | ICD-10-CM | POA: Diagnosis not present

## 2016-10-24 DIAGNOSIS — S3282XD Multiple fractures of pelvis without disruption of pelvic ring, subsequent encounter for fracture with routine healing: Secondary | ICD-10-CM | POA: Diagnosis not present

## 2016-10-24 DIAGNOSIS — Z7982 Long term (current) use of aspirin: Secondary | ICD-10-CM | POA: Diagnosis not present

## 2016-10-25 DIAGNOSIS — S3282XD Multiple fractures of pelvis without disruption of pelvic ring, subsequent encounter for fracture with routine healing: Secondary | ICD-10-CM | POA: Diagnosis not present

## 2016-10-25 DIAGNOSIS — S42291D Other displaced fracture of upper end of right humerus, subsequent encounter for fracture with routine healing: Secondary | ICD-10-CM | POA: Diagnosis not present

## 2016-10-25 DIAGNOSIS — R2689 Other abnormalities of gait and mobility: Secondary | ICD-10-CM | POA: Diagnosis not present

## 2016-10-25 DIAGNOSIS — W19XXXD Unspecified fall, subsequent encounter: Secondary | ICD-10-CM | POA: Diagnosis not present

## 2016-10-25 DIAGNOSIS — Z7982 Long term (current) use of aspirin: Secondary | ICD-10-CM | POA: Diagnosis not present

## 2016-10-25 DIAGNOSIS — I10 Essential (primary) hypertension: Secondary | ICD-10-CM | POA: Diagnosis not present

## 2016-11-20 ENCOUNTER — Other Ambulatory Visit: Payer: Self-pay

## 2016-11-20 ENCOUNTER — Encounter: Payer: Self-pay | Admitting: Nurse Practitioner

## 2016-11-20 ENCOUNTER — Ambulatory Visit (INDEPENDENT_AMBULATORY_CARE_PROVIDER_SITE_OTHER): Payer: Medicare Other | Admitting: Nurse Practitioner

## 2016-11-20 VITALS — BP 132/68 | HR 98 | Temp 98.0°F | Resp 18 | Ht 67.0 in | Wt 124.8 lb

## 2016-11-20 DIAGNOSIS — J0140 Acute pansinusitis, unspecified: Secondary | ICD-10-CM

## 2016-11-20 DIAGNOSIS — J209 Acute bronchitis, unspecified: Secondary | ICD-10-CM | POA: Diagnosis not present

## 2016-11-20 MED ORDER — BENZONATATE 100 MG PO CAPS: 100.0000 mg | ORAL_CAPSULE | Freq: Two times a day (BID) | ORAL | 0 refills | Status: DC | PRN

## 2016-11-20 MED ORDER — DOXYCYCLINE HYCLATE 100 MG PO TABS: 100.0000 mg | ORAL_TABLET | Freq: Two times a day (BID) | ORAL | 0 refills | Status: DC

## 2016-11-20 NOTE — Progress Notes (Signed)
Subjective:    Patient ID: Leslie Johnston, female    DOB: 12/28/1925, 83 y.o.   MRN: [id number]  Leslie Johnston is a 83 y.o. female presenting on 11/20/2016 for Cough (productive cough w/ yellowish/greenish mucus, nauseated, sweating, chest soreness  that the pt assiociates w/ coughing x 7 days )   HPI URI and Cough Symptom onset was 7 days ago with significant nasal and sinus drainage with cough.  She has continued to have nasal and sinus drainage with persistently worsening symptoms.  She also has associated chest soreness, fever w/ sweats, ears muffled, glands swollen, nausea, and epistaxis yesterday. - Twice she has had sweats with fever so bad she needed to change her clothes.   - She has been using mucinex dm and Flonase nasal spray without significant symptom relief..   Social History   Tobacco Use  . Smoking status: Never Smoker  . Smokeless tobacco: Never Used  Substance Use Topics  . Alcohol use: No    Alcohol/week: 0.0 oz  . Drug use: No    Review of Systems Per HPI unless specifically indicated above     Objective:    BP 132/68 (BP Location: Right Arm, Patient Position: Sitting, Cuff Size: Normal)   Pulse 98   Temp 98 F (36.7 C) (Oral)   Resp 18   Ht 5\' 7"  (1.702 m)   Wt 124 lb 12.8 oz (56.6 kg)   SpO2 96%   BMI 19.55 kg/m   Wt Readings from Last 3 Encounters:  11/20/16 124 lb 12.8 oz (56.6 kg)  09/11/16 130 lb 6.4 oz (59.1 kg)  08/29/16 122 lb 8 oz (55.6 kg)    Physical Exam  General - sick appearing, healthy weight, in mild distress HEENT - Normocephalic, atraumatic, PERRL, EOMI, patent nares w/o congestion, oropharynx with cobblestoning, uvula midline, MMM, TM normal bilaterally, Ear canal normal, external ear normal w/o lesions  Pt wearing hearing aids (removed for exam). Neck - supple, non-tender, mild cervical LAD Heart - RRR, no murmurs heard Lungs - Rhonchi and end expiratory wheezing upper lobes.  Diminished air movement in lower lobes.  Normal work of breathing. Extremeties - non-tender, no edema, cap refill < 2 seconds, peripheral pulses intact +2 bilaterally Skin - warm, dry Neuro - awake, alert, oriented x3, normal gait Psych - Normal mood and affect, normal behavior   Results for orders placed or performed in visit on 09/11/16  POCT Urinalysis Dipstick  Result Value Ref Range   Color, UA yellow    Clarity, UA clear    Glucose, UA neg    Bilirubin, UA neg    Ketones, UA neg    Spec Grav, UA 1.010 1.010 - 1.025   Blood, UA neg    pH, UA 5.0 5.0 - 8.0   Protein, UA neg    Urobilinogen, UA 0.2 0.2 or 1.0 E.U./dL   Nitrite, UA neg    Leukocytes, UA Negative Negative      Assessment & Plan:   Problem List Items Addressed This Visit    None    Visit Diagnoses    Acute non-recurrent pansinusitis    -  Primary Consistent with URI and secondary sinusitis with symptoms worsening over the past 7 days and initial symptoms 7 days ago.  Pt also has bronchitis vs pneumonia w/ adventitious breath sounds.  Plan: 1.START taking doxycycline 100 mg tablets. Take one tablet by mouth every 12 hours for 10 days.  Discussed completing antibiotic. - While on  antibiotic, take a probiotic OTC or from food. - Continue anti-histamine loratadine 10mg  daily. - Can use Flonase 2 sprays each nostril daily for up to 4-6 weeks if no epistaxis. - Start Mucinex-DM OTC for  7-10 days prn congestion - START benzonatate 100 mg capsule.  Take one capsule by mouth twice daily as needed for cough. 2. Supportive care with nasal saline, warm herbal tea with honey. 3. Improve hydration 4. Tylenol / Motrin PRN fevers  5. Return criteria given   Relevant Medications   fluticasone (FLONASE) 50 MCG/ACT nasal spray   doxycycline (VIBRA-TABS) 100 MG tablet   benzonatate (TESSALON) 100 MG capsule   Acute bronchitis, unspecified organism     Rhonchi w/ expiratory wheezing in upper lobes.  Pt w/ likely bronchitis, but cannot exclude pneumonia w/  definite s/sx of infection.  Sinusitis treated w/ doxycycline to cover for possible pneumonia.  Plan: 1. See AP above for Sinusitits.   Relevant Medications   doxycycline (VIBRA-TABS) 100 MG tablet   benzonatate (TESSALON) 100 MG capsule      Meds ordered this encounter  Medications  . fluticasone (FLONASE) 50 MCG/ACT nasal spray    Sig: fluticasone 50 mcg/actuation nasal spray,suspension  . DISCONTD: furosemide (LASIX) 20 MG tablet    Sig: furosemide 20 mg tablet  . doxycycline (VIBRA-TABS) 100 MG tablet    Sig: Take 1 tablet (100 mg total) by mouth 2 (two) times daily.    Dispense:  20 tablet    Refill:  0    Order Specific Question:   Supervising Provider    Answer:   Olin Hauser [[id number]]  . benzonatate (TESSALON) 100 MG capsule    Sig: Take 1 capsule (100 mg total) by mouth 2 (two) times daily as needed for cough.    Dispense:  20 capsule    Refill:  0    Order Specific Question:   Supervising Provider    Answer:   Olin Hauser [[id number]]    Follow up plan: Return 5-7 days if symptoms worsen or fail to improve.  Cassell Smiles, DNP, AGPCNP-BC Adult Gerontology Primary Care Nurse Practitioner [facility] 11/20/2016, 1:46 PM

## 2016-11-20 NOTE — Patient Instructions (Addendum)
Leslie Johnston, Thank you for coming in to clinic today.  1. You have a sinus infection and possible pneumonia or bronchitis. - Recommend good hand washing. - START taking doxycycline 100 mg twice daily for 10 days.  Make sure to take all doses of your antibiotic. - While you are on an antibiotic, take a probiotic.  Antibiotics kill good and bad bacteria.  A probiotic helps to replace your good bacteria. Probiotic pills can be found over the counter.  One brand is Florastor, but you can use any brand you prefer.  You can also get good bacteria from yogurt. - Drink plenty of fluids.  - Continue anti-histamine cetirizine 10mg  daily and your nasal spray Flonase 2 sprays each nostril daily for up to 4-6 weeks - Take benzonatate 100 mg twice daily as needed for cough.  Other over the counter medications you may try, if needed for symptoms are: - If congestion is worse, start OTC Mucinex (or may try Mucinex-DM for cough) up to 7-10 days then stop - You may try over the counter Nasal Saline spray (Simply Saline, Ocean Spray) as needed to reduce congestion. - Start taking Tylenol extra strength 1 to 2 tablets every 6-8 hours for aches or fever/chills for next few days as needed.  Do not take more than 3,000 mg in 24 hours from all medicines.  If symptoms are significantly worse with persistent fevers/chills despite tylenol/ibpurofen, nausea, vomiting unable to tolerate food/fluids or medicine, body aches, or shortness of breath, sinus pain pressure or worsening productive cough, then follow-up for re-evaluation, may seek more immediate care at Urgent Care or the ED if you are more concerned that it is an emergency.  Please schedule a follow-up appointment with Cassell Smiles, AGNP. Return 5-7 days if symptoms worsen or fail to improve.  If you have any other questions or concerns, please feel free to call the clinic or send a message through [place]. You may also schedule an earlier appointment if  necessary.  You will receive a survey after today's visit either digitally by e-mail or paper by [name]. Your experiences and feedback matter to [name].  Please respond so we know how we are doing as we provide care for you.   Cassell Smiles, DNP, AGNP-BC Adult Gerontology Nurse Practitioner [place]

## 2016-11-27 ENCOUNTER — Other Ambulatory Visit: Payer: Self-pay | Admitting: Nurse Practitioner

## 2016-11-27 DIAGNOSIS — I10 Essential (primary) hypertension: Secondary | ICD-10-CM

## 2016-12-11 ENCOUNTER — Ambulatory Visit: Payer: Medicare Other | Admitting: Nurse Practitioner

## 2016-12-26 ENCOUNTER — Ambulatory Visit
Admission: RE | Admit: 2016-12-26 | Discharge: 2016-12-26 | Disposition: A | Discharge status: Home / Self Care | Payer: Medicare Other | Source: Ambulatory Visit | Attending: Nurse Practitioner | Admitting: Nurse Practitioner

## 2016-12-26 ENCOUNTER — Ambulatory Visit (INDEPENDENT_AMBULATORY_CARE_PROVIDER_SITE_OTHER): Payer: Medicare Other | Admitting: Nurse Practitioner

## 2016-12-26 ENCOUNTER — Encounter: Payer: Self-pay | Admitting: Nurse Practitioner

## 2016-12-26 VITALS — BP 160/80 | HR 80 | Temp 98.4°F | Resp 14 | Ht 67.0 in | Wt 126.4 lb

## 2016-12-26 DIAGNOSIS — R058 Other specified cough: Secondary | ICD-10-CM

## 2016-12-26 DIAGNOSIS — R05 Cough: Secondary | ICD-10-CM

## 2016-12-26 DIAGNOSIS — J209 Acute bronchitis, unspecified: Secondary | ICD-10-CM | POA: Diagnosis not present

## 2016-12-26 DIAGNOSIS — R918 Other nonspecific abnormal finding of lung field: Secondary | ICD-10-CM | POA: Diagnosis not present

## 2016-12-26 MED ORDER — ALBUTEROL SULFATE HFA 108 (90 BASE) MCG/ACT IN AERS: 1.0000 | INHALATION_SPRAY | Freq: Four times a day (QID) | RESPIRATORY_TRACT | 5 refills | Status: DC | PRN

## 2016-12-26 MED ORDER — BENZONATATE 100 MG PO CAPS: 100.0000 mg | ORAL_CAPSULE | Freq: Two times a day (BID) | ORAL | 0 refills | Status: DC | PRN

## 2016-12-26 NOTE — Progress Notes (Signed)
Subjective:    Patient ID: Leslie Johnston, female    DOB: 07-22-1925, 83 y.o.   MRN: [id number]  Leslie Johnston is a 83 y.o. female presenting on 12/26/2016 for Cough (chest congestion for 5 days)   HPI Pt is accompanied today to clinic by a great niece.  URI and cough Patient presents today with upper respiratory infection symptoms with onset approximately 3 days ago and significant worsening 1 day ago.  Today she feels slightly improved over yesterday, but still admits to sinus congestion, nasal congestion, rhinorrhea, postnasal drip, cough with production, wheezing.  Patient denies tooth pain or jaw pain, ear pain/pressure/fullness, fever, chills, sweats, nausea, vomiting, diarrhea. Felt much worse yesterday.  Patient has had recurrent sinusitis over the last year with visits on November 20, 2016, April 19, 2016, March 20, 2016 and September 19, 2015.  No recent visits to ENT are noted.   Social History   Tobacco Use  . Smoking status: Never Smoker  . Smokeless tobacco: Never Used  Substance Use Topics  . Alcohol use: No    Alcohol/week: 0.0 oz  . Drug use: No    Review of Systems Per HPI unless specifically indicated above     Objective:    BP (!) 160/80 (BP Location: Left Arm, Patient Position: Sitting, Cuff Size: Normal)   Pulse 80   Temp 98.4 F (36.9 C) (Oral)   Resp 14   Ht 5\' 7"  (1.702 m)   Wt 126 lb 6.4 oz (57.3 kg)   SpO2 95%   BMI 19.80 kg/m   Wt Readings from Last 3 Encounters:  12/26/16 126 lb 6.4 oz (57.3 kg)  11/20/16 124 lb 12.8 oz (56.6 kg)  09/11/16 130 lb 6.4 oz (59.1 kg)    Physical Exam  Constitutional: She is oriented to person, place, and time. She appears well-developed and well-nourished. She appears distressed (mildly).  HENT:  Head: Normocephalic and atraumatic.  Right Ear: Hearing, tympanic membrane, external ear and ear canal normal.  Left Ear: Hearing, tympanic membrane, external ear and ear canal normal.  Nose: Mucosal  edema and rhinorrhea present. Right sinus exhibits maxillary sinus tenderness and frontal sinus tenderness. Left sinus exhibits maxillary sinus tenderness and frontal sinus tenderness.  Mouth/Throat: Uvula is midline and mucous membranes are normal. She has dentures. Oropharyngeal exudate (clear secretions) and posterior oropharyngeal edema (cobblestoning) present.  Neck: Normal range of motion. Neck supple.  Cardiovascular: Normal rate, regular rhythm, S1 normal, S2 normal and normal heart sounds.  Pulmonary/Chest: Effort normal. No respiratory distress. She has wheezes (througout lungs). She has rales (throughout lungs).  Lymphadenopathy:    She has no cervical adenopathy.  Neurological: She is alert and oriented to person, place, and time.  Skin: Skin is warm and dry.  Psychiatric: She has a normal mood and affect. Her behavior is normal.     Results for orders placed or performed in visit on 09/11/16  POCT Urinalysis Dipstick  Result Value Ref Range   Color, UA yellow    Clarity, UA clear    Glucose, UA neg    Bilirubin, UA neg    Ketones, UA neg    Spec Grav, UA 1.010 1.010 - 1.025   Blood, UA neg    pH, UA 5.0 5.0 - 8.0   Protein, UA neg    Urobilinogen, UA 0.2 0.2 or 1.0 E.U./dL   Nitrite, UA neg    Leukocytes, UA Negative Negative      Assessment & Plan:  Problem List Items Addressed This Visit    None    Visit Diagnoses    Cough with expectoration    -  Primary Acute illness. Fever responsive to NSAIDs and tylenol.  Symptoms not worsening. Consistent with viral illness x 3 days with no known sick contacts and no identifiable focal infections of ears, nose, throat.  Plan: 1. Reassurance, likely self-limited with cough lasting up to few weeks - Start Atrovent nasal spray decongestant 2 sprays each nostril up to 4 times daily for 5-7 days - Continue anti-histamine Loratadine 10mg  daily,  - also can use Flonase 2 sprays each nostril daily for up to 4-6 weeks - Start  Mucinex-DM OTC up to 7-10 days then stop - Start tessalon perles 100 mg every 12 hours as needed for cough. 2. Supportive care with nasal saline, warm herbal tea with honey, 3. Improve hydration 4. Tylenol / Motrin PRN fevers 5. Because of nature of wheezing and productive cough with rales, Perform Chest Xray today to rule out pneumonia. - Personal review of Xray images and results shows no evidence of pneumonia. 6. Return criteria given     Relevant Medications   albuterol (PROVENTIL HFA;VENTOLIN HFA) 108 (90 Base) MCG/ACT inhaler   Other Relevant Orders   DG Chest 2 View (Completed)   Acute bronchitis, unspecified organism     See AP above - likely viral URI with acute bronchitis.   Relevant Medications   benzonatate (TESSALON) 100 MG capsule   albuterol (PROVENTIL HFA;VENTOLIN HFA) 108 (90 Base) MCG/ACT inhaler      Meds ordered this encounter  Medications  . benzonatate (TESSALON) 100 MG capsule    Sig: Take 1 capsule (100 mg total) by mouth 2 (two) times daily as needed for cough.    Dispense:  20 capsule    Refill:  0    Order Specific Question:   Supervising Provider    Answer:   Olin Hauser [[id number]]  . albuterol (PROVENTIL HFA;VENTOLIN HFA) 108 (90 Base) MCG/ACT inhaler    Sig: Inhale 1-2 puffs into the lungs every 6 (six) hours as needed for wheezing or shortness of breath.    Dispense:  1 Inhaler    Refill:  5    Product selection permitted for brand / insurance preference.    Order Specific Question:   Supervising Provider    Answer:   Olin Hauser [[id number]]    Follow up plan: Return 5 days by phone if symptoms worsen or fail to improve.  Cassell Smiles, DNP, AGPCNP-BC Adult Gerontology Primary Care Nurse Practitioner [facility] 12/26/2016, 2:26 PM

## 2016-12-26 NOTE — Patient Instructions (Addendum)
Leslie Johnston, Thank you for coming in to clinic today.  1. It sounds like you have a Upper Respiratory Virus - this will most likely run it's course in 7 to 10 days. Recommend good hand washing. - Continue anti-histamine loratadine 10mg  daily, also can use Flonase 2 sprays each nostril daily for up to 4-6 weeks - If congestion is worse, start OTC Mucinex (or may try Mucinex-DM for cough) up to 7-10 days then stop - Drink plenty of fluids to improve congestion - You may try over the counter Nasal Saline spray (Simply Saline, Ocean Spray) as needed to reduce congestion. - Drink warm herbal tea with honey for sore throat. - Start taking Tylenol extra strength 1 to 2 tablets every 6-8 hours for aches or fever/chills for next few days as needed.  Do not take more than 3,000 mg in 24 hours from all medicines.  May take Ibuprofen as well if tolerated 200-400mg  every 8 hours as needed.  If symptoms significantly worsening with persistent fevers/chills despite tylenol/ibpurofen, nausea, vomiting unable to tolerate food/fluids or medicine, body aches, or shortness of breath, sinus pain pressure or worsening productive cough, then follow-up for re-evaluation, may seek more immediate care at Urgent Care or ED if more concerned for emergency.  - You also have some wheezing that can mean you have pneumonia.  Please go to [facility] for a chest Xray today.  After your X-ray, I will send an antibiotic if you have pneumonia.   If your symptoms are not from pneumonia and do not go away by Monday, 12/31/2016, call clinic for an antibiotic for a sinus infection.   Please schedule a follow-up appointment with Cassell Smiles, AGNP. Call clinic in 5 days if symptoms worsen or fail to improve.  If you have any other questions or concerns, please feel free to call the clinic or send a message through [place]. You may also schedule an earlier appointment if necessary.  You will receive a survey after  today's visit either digitally by e-mail or paper by [name]. Your experiences and feedback matter to [name].  Please respond so we know how we are doing as we provide care for you.   Cassell Smiles, DNP, AGNP-BC Adult Gerontology Nurse Practitioner [facility]

## 2016-12-31 ENCOUNTER — Other Ambulatory Visit: Payer: Self-pay | Admitting: Nurse Practitioner

## 2017-03-23 ENCOUNTER — Other Ambulatory Visit: Payer: Self-pay | Admitting: Nurse Practitioner

## 2017-03-23 DIAGNOSIS — B9689 Other specified bacterial agents as the cause of diseases classified elsewhere: Secondary | ICD-10-CM

## 2017-03-23 DIAGNOSIS — J302 Other seasonal allergic rhinitis: Secondary | ICD-10-CM

## 2017-03-23 DIAGNOSIS — J019 Acute sinusitis, unspecified: Principal | ICD-10-CM

## 2017-05-25 ENCOUNTER — Other Ambulatory Visit: Payer: Self-pay | Admitting: Nurse Practitioner

## 2017-05-25 DIAGNOSIS — I10 Essential (primary) hypertension: Secondary | ICD-10-CM

## 2017-05-28 ENCOUNTER — Other Ambulatory Visit: Payer: Self-pay | Admitting: Nurse Practitioner

## 2017-05-28 DIAGNOSIS — I10 Essential (primary) hypertension: Secondary | ICD-10-CM

## 2017-06-12 ENCOUNTER — Other Ambulatory Visit: Payer: Self-pay | Admitting: Nurse Practitioner

## 2017-06-12 DIAGNOSIS — I10 Essential (primary) hypertension: Secondary | ICD-10-CM

## 2017-06-17 ENCOUNTER — Other Ambulatory Visit: Payer: Self-pay | Admitting: Nurse Practitioner

## 2017-06-17 DIAGNOSIS — I10 Essential (primary) hypertension: Secondary | ICD-10-CM

## 2017-07-02 DIAGNOSIS — H35373 Puckering of macula, bilateral: Secondary | ICD-10-CM | POA: Diagnosis not present

## 2017-07-07 ENCOUNTER — Other Ambulatory Visit: Payer: Self-pay | Admitting: Nurse Practitioner

## 2017-07-07 DIAGNOSIS — J019 Acute sinusitis, unspecified: Secondary | ICD-10-CM

## 2017-07-07 DIAGNOSIS — J302 Other seasonal allergic rhinitis: Secondary | ICD-10-CM

## 2017-07-07 DIAGNOSIS — I10 Essential (primary) hypertension: Secondary | ICD-10-CM

## 2017-07-07 DIAGNOSIS — B9689 Other specified bacterial agents as the cause of diseases classified elsewhere: Secondary | ICD-10-CM

## 2017-07-09 ENCOUNTER — Ambulatory Visit (INDEPENDENT_AMBULATORY_CARE_PROVIDER_SITE_OTHER): Payer: Medicare Other

## 2017-07-09 VITALS — BP 142/76 | HR 84 | Temp 98.0°F | Resp 17 | Ht 67.0 in | Wt 137.4 lb

## 2017-07-09 DIAGNOSIS — Z0000 Encounter for general adult medical examination without abnormal findings: Secondary | ICD-10-CM

## 2017-07-09 NOTE — Progress Notes (Addendum)
Subjective:   Leslie Johnston is a 84 y.o. female who presents for Medicare Annual (Subsequent) preventive examination.  Review of Systems:   Cardiac Risk Factors include: advanced age (>66men, >64 women);hypertension     Objective:     Vitals: BP (!) 142/76 (BP Location: Left Arm, Patient Position: Sitting)   Pulse 84   Temp 98 F (36.7 C) (Oral)   Resp 17   Ht 5\' 7"  (1.702 m)   Wt 137 lb 6.4 oz (62.3 kg)   BMI 21.52 kg/m   Body mass index is 21.52 kg/m.  Advanced Directives 07/09/2017 08/29/2016 08/28/2016 06/19/2016 07/27/2014  Does Patient Have a Medical Advance Directive? No No No No No  Would patient like information on creating a medical advance directive? No - Patient declined No - Patient declined - Yes (MAU/Ambulatory/Procedural Areas - Information given) Yes - Educational materials given    Tobacco Social History   Tobacco Use  Smoking Status Never Smoker  Smokeless Tobacco Never Used     Counseling given: Not Answered   Clinical Intake:  Pre-visit preparation completed: Yes  Pain : No/denies pain     Nutritional Status: BMI of 19-24  Normal Nutritional Risks: None Diabetes: No  How often do you need to have someone help you when you read instructions, pamphlets, or other written materials from your doctor or pharmacy?: 1 - Never What is the last grade level you completed in school?: 12th grade  Interpreter Needed?: No  Information entered by :: Leslie Riege,LPN   Past Medical History:  Diagnosis Date  . Allergy   . Hypertension    Past Surgical History:  Procedure Laterality Date  . EYE SURGERY     cataract extraction- Right   Family History  Problem Relation Age of Onset  . Pancreatic cancer Sister   . Arthritis Maternal Grandmother   . Leukemia Maternal Grandfather   . Asthma Maternal Grandfather   . Breast cancer Daughter   . Bladder Cancer Daughter    Social History   Socioeconomic History  . Marital status: Widowed   Spouse name: Not on file  . Number of children: Not on file  . Years of education: Not on file  . Highest education level: Not on file  Occupational History  . Not on file  Social Needs  . Financial resource strain: Not hard at all  . Food insecurity:    Worry: Never true    Inability: Never true  . Transportation needs:    Medical: No    Non-medical: No  Tobacco Use  . Smoking status: Never Smoker  . Smokeless tobacco: Never Used  Substance and Sexual Activity  . Alcohol use: No    Alcohol/week: 0.0 oz  . Drug use: No  . Sexual activity: Not on file  Lifestyle  . Physical activity:    Days per week: 0 days    Minutes per session: 0 min  . Stress: Not at all  Relationships  . Social connections:    Talks on phone: More than three times a week    Gets together: More than three times a week    Attends religious service: Never    Active member of club or organization: No    Attends meetings of clubs or organizations: Never    Relationship status: Widowed  Other Topics Concern  . Not on file  Social History Narrative  . Not on file    Outpatient Encounter Medications as of 07/09/2017  Medication Sig  .  aspirin 81 MG tablet Take 81 mg by mouth daily.  . calcium citrate-vitamin D (CITRACAL+D) 315-200 MG-UNIT tablet Take 1 tablet by mouth 2 (two) times daily.  . fluticasone (FLONASE) 50 MCG/ACT nasal spray SHAKE LIQUID AND USE 2 SPRAYS IN EACH NOSTRIL DAILY  . lisinopril (PRINIVIL,ZESTRIL) 10 MG tablet TAKE 1 TABLET BY MOUTH DAILY  . Multiple Vitamin (MULTIVITAMIN WITH MINERALS) TABS tablet Take 1 tablet by mouth daily.  Marland Kitchen acetaminophen (TYLENOL) 500 MG tablet Take 500 mg by mouth every 6 (six) hours as needed.  Marland Kitchen albuterol (PROVENTIL HFA;VENTOLIN HFA) 108 (90 Base) MCG/ACT inhaler Inhale 1-2 puffs into the lungs every 6 (six) hours as needed for wheezing or shortness of breath. (Patient not taking: Reported on 07/09/2017)  . benzonatate (TESSALON) 100 MG capsule Take 1 capsule  (100 mg total) by mouth 2 (two) times daily as needed for cough. (Patient not taking: Reported on 07/09/2017)  . doxycycline (VIBRA-TABS) 100 MG tablet Take 1 tablet (100 mg total) by mouth 2 (two) times daily. (Patient not taking: Reported on 07/09/2017)  . furosemide (LASIX) 20 MG tablet Take 0.5 tablets (10 mg total) by mouth daily.  . hydrochlorothiazide (HYDRODIURIL) 12.5 MG tablet TAKE 1 TABLET BY MOUTH EVERY DAY (Patient not taking: Reported on 07/09/2017)   No facility-administered encounter medications on file as of 07/09/2017.     Activities of Daily Living In your present state of health, do you have any difficulty performing the following activities: 07/09/2017 08/29/2016  Hearing? Leslie Johnston  Comment has hearing aids  -  Vision? N N  Difficulty concentrating or making decisions? N N  Walking or climbing stairs? N N  Dressing or bathing? N N  Doing errands, shopping? N N  Preparing Food and eating ? N -  Using the Toilet? N -  In the past six months, have you accidently leaked urine? Y -  Comment wears depends when going out for protection  -  Do you have problems with loss of bowel control? N -  Managing your Medications? N -  Managing your Finances? N -  Housekeeping or managing your Housekeeping? N -  Some recent data might be hidden    Patient Care Team: [name], NP as PCP - General    Assessment:   This is a routine wellness examination for Leslie Johnston.  Exercise Activities and Dietary recommendations Current Exercise Habits: The patient does not participate in regular exercise at present, Exercise limited by: None identified  Goals    . DIET - INCREASE WATER INTAKE     Recommend drinking at least 6-8 glasses of water a day     . Family to eat at dinner table more often     Pt would like some help cleaning dishes when family arrives for family meals.        Fall Risk Fall Risk  07/09/2017 09/11/2016 06/19/2016 03/20/2016 07/27/2014  Falls in the past year? Yes Yes  No Yes Yes  Number falls in past yr: 1 1 - 2 or more 1  Injury with Fall? Yes Yes - No Yes  Risk Factor Category  High Fall Risk - - - -  Risk for fall due to : - - - History of fall(s) -  Follow up Falls prevention discussed Falls prevention discussed - - Education provided;Falls prevention discussed;Falls evaluation completed   Is the patient's home free of loose throw rugs in walkways, pet beds, electrical cords, etc?   yes  Grab bars in the bathroom? no      Handrails on the stairs?   yes, no stairs       Adequate lighting?   yes  Timed Get Up and Go performed: Completed in 8 seconds with no use of assistive devices, steady gait. No intervention needed at this time.   Depression Screen PHQ 2/9 Scores 07/09/2017 09/11/2016 06/19/2016 03/20/2016  PHQ - 2 Score 0 0 0 0     Cognitive Function MMSE - Mini Mental State Exam 07/27/2014  Orientation to time 5  Orientation to Place 5  Registration 3  Attention/ Calculation 5  Recall 3  Language- name 2 objects 2  Language- repeat 1  Language- follow 3 step command 3  Language- read & follow direction 1  Write a sentence 1  Copy design 1  Total score 30     6CIT Screen 07/09/2017 06/19/2016  What Year? 0 points 0 points  What month? 0 points 0 points  What time? 0 points 0 points  Count back from 20 0 points 0 points  Months in reverse 0 points 0 points  Repeat phrase 2 points 0 points  Total Score 2 0    Immunization History  Administered Date(s) Administered  . Pneumococcal Conjugate-13 04/25/2013  . Pneumococcal Polysaccharide-23 06/19/2016    Qualifies for Shingles Vaccine? no  Screening Tests Health Maintenance  Topic Date Due  . INFLUENZA VACCINE  08/01/2017  . TETANUS/TDAP  01/01/2021  . DEXA SCAN  Completed  . PNA vac Low Risk Adult  Completed    Cancer Screenings: Lung: Low Dose CT Chest recommended if Age 18-80 years, 30 pack-year currently smoking OR have quit w/in 15years. Patient does not  qualify. Breast:  Up to date on Mammogram? Yes   Up to date of Bone Density/Dexa? Yes 12/01/2013 Colorectal: no longer required  Additional Screenings:  Hepatitis C Screening: not indicated      Plan:    I have personally reviewed and addressed the Medicare Annual Wellness questionnaire and have noted the following in the patient's chart:  A. Medical and social history B. Use of alcohol, tobacco or illicit drugs  C. Current medications and supplements D. Functional ability and status E.  Nutritional status F.  Physical activity G. Advance directives H. List of other physicians I.  Hospitalizations, surgeries, and ER visits in previous 12 months J.  [place] such as hearing and vision if needed, cognitive and depression L. Referrals and appointments   In addition, I have reviewed and discussed with patient certain preventive protocols, quality metrics, and best practice recommendations. A written personalized care plan for preventive services as well as general preventive health recommendations were provided to patient.   Signed,  Tyler Aas, LPN Nurse Health Advisor   Nurse Notes:none

## 2017-07-09 NOTE — Patient Instructions (Addendum)
Leslie Johnston , Thank you for taking time to come for your Medicare Wellness Visit. I appreciate your ongoing commitment to your health goals. Please review the following plan we discussed and let me know if I can assist you in the future.   Screening recommendations/referrals: Colonoscopy: no longer required Mammogram: no longer required Bone Density: completed 12/01/2013 Recommended yearly ophthalmology/optometry visit for glaucoma screening and checkup Recommended yearly dental visit for hygiene and checkup  Vaccinations: Influenza vaccine: due 09/2017 Pneumococcal vaccine: up to date Tdap vaccine: up to date Shingles vaccine: not eligible  Advanced directives: Advance directive discussed with you today. Even though you declined this today please call our office should you change your mind and we can give you the proper paperwork for you to fill out.  Conditions/risks identified: Recommend drinking at least 6-8 glasses of water a day   Next appointment:Follow up in one year for your annual wellness exam.    Preventive Care 65 Years and Older, Female Preventive care refers to lifestyle choices and visits with your health care provider that can promote health and wellness. What does preventive care include?  A yearly physical exam. This is also called an annual well check.  Dental exams once or twice a year.  Routine eye exams. Ask your health care provider how often you should have your eyes checked.  Personal lifestyle choices, including:  Daily care of your teeth and gums.  Regular physical activity.  Eating a healthy diet.  Avoiding tobacco and drug use.  Limiting alcohol use.  Practicing safe sex.  Taking low-dose aspirin every day.  Taking vitamin and mineral supplements as recommended by your health care provider. What happens during an annual well check? The services and screenings done by your health care provider during your annual well check will depend on  your age, overall health, lifestyle risk factors, and family history of disease. Counseling  Your health care provider may ask you questions about your:  Alcohol use.  Tobacco use.  Drug use.  Emotional well-being.  Home and relationship well-being.  Sexual activity.  Eating habits.  History of falls.  Memory and ability to understand (cognition).  Work and work Statistician.  Reproductive health. Screening  You may have the following tests or measurements:  Height, weight, and BMI.  Blood pressure.  Lipid and cholesterol levels. These may be checked every 5 years, or more frequently if you are over 42 years old.  Skin check.  Lung cancer screening. You may have this screening every year starting at age 38 if you have a 30-pack-year history of smoking and currently smoke or have quit within the past 15 years.  Fecal occult blood test (FOBT) of the stool. You may have this test every year starting at age 36.  Flexible sigmoidoscopy or colonoscopy. You may have a sigmoidoscopy every 5 years or a colonoscopy every 10 years starting at age 30.  Hepatitis C blood test.  Hepatitis B blood test.  Sexually transmitted disease (STD) testing.  Diabetes screening. This is done by checking your blood sugar (glucose) after you have not eaten for a while (fasting). You may have this done every 1-3 years.  Bone density scan. This is done to screen for osteoporosis. You may have this done starting at age 46.  Mammogram. This may be done every 1-2 years. Talk to your health care provider about how often you should have regular mammograms. Talk with your health care provider about your test results, treatment options, and if  necessary, the need for more tests. Vaccines  Your health care provider may recommend certain vaccines, such as:  Influenza vaccine. This is recommended every year.  Tetanus, diphtheria, and acellular pertussis (Tdap, Td) vaccine. You may need a Td booster  every 10 years.  Zoster vaccine. You may need this after age 75.  Pneumococcal 13-valent conjugate (PCV13) vaccine. One dose is recommended after age 33.  Pneumococcal polysaccharide (PPSV23) vaccine. One dose is recommended after age 12. Talk to your health care provider about which screenings and vaccines you need and how often you need them. This information is not intended to replace advice given to you by your health care provider. Make sure you discuss any questions you have with your health care provider. Document Released: 01/14/2015 Document Revised: 09/07/2015 Document Reviewed: 10/19/2014 Elsevier Interactive Patient Education  2017 [place] Prevention in the Home Falls can cause injuries. They can happen to people of all ages. There are many things you can do to make your home safe and to help prevent falls. What can I do on the outside of my home?  Regularly fix the edges of walkways and driveways and fix any cracks.  Remove anything that might make you trip as you walk through a door, such as a raised step or threshold.  Trim any bushes or trees on the path to your home.  Use bright outdoor lighting.  Clear any walking paths of anything that might make someone trip, such as rocks or tools.  Regularly check to see if handrails are loose or broken. Make sure that both sides of any steps have handrails.  Any raised decks and porches should have guardrails on the edges.  Have any leaves, snow, or ice cleared regularly.  Use sand or salt on walking paths during winter.  Clean up any spills in your garage right away. This includes oil or grease spills. What can I do in the bathroom?  Use night lights.  Install grab bars by the toilet and in the tub and shower. Do not use towel bars as grab bars.  Use non-skid mats or decals in the tub or shower.  If you need to sit down in the shower, use a plastic, non-slip stool.  Keep the floor dry. Clean up any  water that spills on the floor as soon as it happens.  Remove soap buildup in the tub or shower regularly.  Attach bath mats securely with double-sided non-slip rug tape.  Do not have throw rugs and other things on the floor that can make you trip. What can I do in the bedroom?  Use night lights.  Make sure that you have a light by your bed that is easy to reach.  Do not use any sheets or blankets that are too big for your bed. They should not hang down onto the floor.  Have a firm chair that has side arms. You can use this for support while you get dressed.  Do not have throw rugs and other things on the floor that can make you trip. What can I do in the kitchen?  Clean up any spills right away.  Avoid walking on wet floors.  Keep items that you use a lot in easy-to-reach places.  If you need to reach something above you, use a strong step stool that has a grab bar.  Keep electrical cords out of the way.  Do not use floor polish or wax that makes floors slippery. If you must  use wax, use non-skid floor wax.  Do not have throw rugs and other things on the floor that can make you trip. What can I do with my stairs?  Do not leave any items on the stairs.  Make sure that there are handrails on both sides of the stairs and use them. Fix handrails that are broken or loose. Make sure that handrails are as long as the stairways.  Check any carpeting to make sure that it is firmly attached to the stairs. Fix any carpet that is loose or worn.  Avoid having throw rugs at the top or bottom of the stairs. If you do have throw rugs, attach them to the floor with carpet tape.  Make sure that you have a light switch at the top of the stairs and the bottom of the stairs. If you do not have them, ask someone to add them for you. What else can I do to help prevent falls?  Wear shoes that:  Do not have high heels.  Have rubber bottoms.  Are comfortable and fit you well.  Are closed  at the toe. Do not wear sandals.  If you use a stepladder:  Make sure that it is fully opened. Do not climb a closed stepladder.  Make sure that both sides of the stepladder are locked into place.  Ask someone to hold it for you, if possible.  Clearly mark and make sure that you can see:  Any grab bars or handrails.  First and last steps.  Where the edge of each step is.  Use tools that help you move around (mobility aids) if they are needed. These include:  Canes.  Walkers.  Scooters.  Crutches.  Turn on the lights when you go into a dark area. Replace any light bulbs as soon as they burn out.  Set up your furniture so you have a clear path. Avoid moving your furniture around.  If any of your floors are uneven, fix them.  If there are any pets around you, be aware of where they are.  Review your medicines with your doctor. Some medicines can make you feel dizzy. This can increase your chance of falling. Ask your doctor what other things that you can do to help prevent falls. This information is not intended to replace advice given to you by your health care provider. Make sure you discuss any questions you have with your health care provider. Document Released: 10/14/2008 Document Revised: 05/26/2015 Document Reviewed: 01/22/2014 Elsevier Interactive Patient Education  2017 [employer].

## 2017-08-06 ENCOUNTER — Other Ambulatory Visit: Payer: Self-pay | Admitting: Nurse Practitioner

## 2017-08-06 DIAGNOSIS — I10 Essential (primary) hypertension: Secondary | ICD-10-CM

## 2017-08-13 ENCOUNTER — Other Ambulatory Visit: Payer: Self-pay

## 2017-08-13 ENCOUNTER — Encounter: Payer: Self-pay | Admitting: Nurse Practitioner

## 2017-08-13 ENCOUNTER — Ambulatory Visit (INDEPENDENT_AMBULATORY_CARE_PROVIDER_SITE_OTHER): Payer: Medicare Other | Admitting: Nurse Practitioner

## 2017-08-13 VITALS — BP 129/71 | HR 95 | Temp 98.1°F | Ht 67.0 in | Wt 136.6 lb

## 2017-08-13 DIAGNOSIS — Z780 Asymptomatic menopausal state: Secondary | ICD-10-CM | POA: Diagnosis not present

## 2017-08-13 DIAGNOSIS — M810 Age-related osteoporosis without current pathological fracture: Secondary | ICD-10-CM | POA: Diagnosis not present

## 2017-08-13 DIAGNOSIS — I10 Essential (primary) hypertension: Secondary | ICD-10-CM

## 2017-08-13 MED ORDER — HYDROCHLOROTHIAZIDE 12.5 MG PO TABS: 12.5000 mg | ORAL_TABLET | Freq: Every day | ORAL | 1 refills | Status: DC

## 2017-08-13 MED ORDER — LISINOPRIL 10 MG PO TABS: 10.0000 mg | ORAL_TABLET | Freq: Every day | ORAL | 1 refills | Status: DC

## 2017-08-13 NOTE — Patient Instructions (Addendum)
Leslie Johnston,   Thank you for coming in to clinic today.  1. I recommend you get your flu shot this year.  Return to clinic in October for your flu shot if you decide to get it.  2. Continue medications without changes.  3. You will be due for labs today.  4. Your Bone Density Scan order has been placed.  Call the Scheduling phone number at [phone] to schedule your test at your convenience.  You can choose to go to either location listed below.  Let the scheduler know which location you prefer.  [place]  [place], [place] [address]   [facility] [address] [place], [place] [address]   Please schedule a follow-up appointment with Cassell Smiles, AGNP. Return in about 6 months (around 02/13/2018) for hypertension.  If you have any other questions or concerns, please feel free to call the clinic or send a message through [place]. You may also schedule an earlier appointment if necessary.  You will receive a survey after today's visit either digitally by e-mail or paper by [name]. Your experiences and feedback matter to [name].  Please respond so we know how we are doing as we provide care for you.   Cassell Smiles, DNP, AGNP-BC Adult Gerontology Nurse Practitioner [place]

## 2017-08-13 NOTE — Progress Notes (Signed)
Subjective:    Patient ID: Leslie Johnston, female    DOB: 08-30-1925, 84 y.o.   MRN: [id number]  Leslie Johnston is a 84 y.o. female presenting on 08/13/2017 for Hypertension   HPI Hypertension - She is not checking BP at home or outside of clinic.  Readings 120-130/70s - Current medications: hydrochlorothiazide 12.5 mg once daily, lisinopril 10 mg once daily, furosemide 10 mg once daily, tolerating well without side effects, but family is noting more difficulty with urinary frequency.  They stopped furosemide and have not noted any new swelling or problems with blood pressure. - She is not currently symptomatic. - Pt denies headache, lightheadedness, dizziness, changes in vision, chest tightness/pressure, palpitations, leg swelling, sudden loss of speech or loss of consciousness.  Social History   Tobacco Use  . Smoking status: Never Smoker  . Smokeless tobacco: Never Used  Substance Use Topics  . Alcohol use: No    Alcohol/week: 0.0 standard drinks  . Drug use: No    Review of Systems Per HPI unless specifically indicated above     Objective:    BP 129/71 (BP Location: Left Arm, Patient Position: Sitting, Cuff Size: Normal)   Pulse 95   Temp 98.1 F (36.7 C) (Oral)   Ht 5\' 7"  (1.702 m)   Wt 136 lb 9.6 oz (62 kg)   LMP  (Exact Date)   BMI 21.39 kg/m   Wt Readings from Last 3 Encounters:  08/13/17 136 lb 9.6 oz (62 kg)  07/09/17 137 lb 6.4 oz (62.3 kg)  12/26/16 126 lb 6.4 oz (57.3 kg)    Physical Exam  Constitutional: She is oriented to person, place, and time. She appears well-developed and well-nourished. No distress.  HENT:  Head: Normocephalic and atraumatic.  Cardiovascular: Normal rate, regular rhythm, S1 normal, S2 normal and intact distal pulses.  Pulmonary/Chest: Effort normal and breath sounds normal. No respiratory distress.  Neurological: She is alert and oriented to person, place, and time.  Skin: Skin is warm and dry.  Psychiatric: She has a  normal mood and affect. She is agitated.  Vitals reviewed.  Results for orders placed or performed in visit on 09/11/16  POCT Urinalysis Dipstick  Result Value Ref Range   Color, UA yellow    Clarity, UA clear    Glucose, UA neg    Bilirubin, UA neg    Ketones, UA neg    Spec Grav, UA 1.010 1.010 - 1.025   Blood, UA neg    pH, UA 5.0 5.0 - 8.0   Protein, UA neg    Urobilinogen, UA 0.2 0.2 or 1.0 E.U./dL   Nitrite, UA neg    Leukocytes, UA Negative Negative      Assessment & Plan:   Problem List Items Addressed This Visit      Cardiovascular and Mediastinum   HTN (hypertension) - Primary   Relevant Orders   COMPLETE METABOLIC PANEL WITH GFR (Completed)     Musculoskeletal and Integument   Postmenopausal osteoporosis with pathological fracture of humerus    Other Visit Diagnoses    Asymptomatic postmenopausal estrogen deficiency       Relevant Orders   DG Bone Density    # Controlled hypertension.  BP goal < 130/80.  Pt is not working on lifestyle modifications, but leads a generally healthy lifestyle despite dementia.  Taking medications tolerating well without side effects.  Already stopped lasix and is doing well without complications with BP still at goal.  Plan: 1.  Continue taking HCTZ and lisinopril 2. Obtain labs  3. Encouraged heart healthy diet and increasing exercise to 30 minutes most days of the week. 4. Check BP 1-2 x per week at home, keep log, and bring to clinic at next appointment. 5. Follow up 6 months.      # Osteoporosis Patient with age related, estrogen-deficiency osteoporosis - no active fracture.  No recent bone density scan.  Due. Patient and family willing to proceed with repeat for treatment if necessary to prevent future fractures. - Placed DEXA order - Encouraged adequate calcium intake - Continue regular physical activity, including arm exercises to prevent further bone loss. - Follow-up after DEXA and annually.  Meds ordered this  encounter  Medications  . lisinopril (PRINIVIL,ZESTRIL) 10 MG tablet    Sig: Take 1 tablet (10 mg total) by mouth daily.    Dispense:  90 tablet    Refill:  1  . hydrochlorothiazide (HYDRODIURIL) 12.5 MG tablet    Sig: Take 1 tablet (12.5 mg total) by mouth daily.    Dispense:  90 tablet    Refill:  1    Follow up plan: Return in about 6 months (around 02/13/2018) for hypertension.  Cassell Smiles, DNP, AGPCNP-BC Adult Gerontology Primary Care Nurse Practitioner [facility] 08/13/2017, 11:10 AM

## 2017-08-14 LAB — COMPLETE METABOLIC PANEL WITH GFR
AG Ratio: 1.3 (calc) (ref 1.0–2.5)
ALT: 7 U/L (ref 6–29)
AST: 15 U/L (ref 10–35)
Albumin: 3.8 g/dL (ref 3.6–5.1)
Alkaline phosphatase (APISO): 63 U/L (ref 33–130)
BUN/Creatinine Ratio: 19 (calc) (ref 6–22)
BUN: 17 mg/dL (ref 7–25)
CO2: 30 mmol/L (ref 20–32)
Calcium: 10.3 mg/dL (ref 8.6–10.4)
Chloride: 102 mmol/L (ref 98–110)
Creat: 0.9 mg/dL — ABNORMAL HIGH (ref 0.60–0.88)
GFR, Est African American: 66 mL/min/{1.73_m2} (ref >=60)
GFR, Est Non African American: 57 mL/min/{1.73_m2} — ABNORMAL LOW (ref >=60)
Globulin: 2.9 g/dL (calc) (ref 1.9–3.7)
Glucose, Bld: 111 mg/dL — ABNORMAL HIGH (ref 65–99)
Potassium: 4.4 mmol/L (ref 3.5–5.3)
Sodium: 137 mmol/L (ref 135–146)
Total Bilirubin: 0.4 mg/dL (ref 0.2–1.2)
Total Protein: 6.7 g/dL (ref 6.1–8.1)

## 2017-09-07 ENCOUNTER — Emergency Department: Payer: Medicare Other

## 2017-09-07 ENCOUNTER — Other Ambulatory Visit: Payer: Self-pay

## 2017-09-07 ENCOUNTER — Emergency Department
Admission: EM | Admit: 2017-09-07 | Discharge: 2017-09-07 | Disposition: A | Discharge status: Home / Self Care | Payer: Medicare Other | Attending: Emergency Medicine | Admitting: Emergency Medicine

## 2017-09-07 DIAGNOSIS — M545 Low back pain: Secondary | ICD-10-CM | POA: Diagnosis not present

## 2017-09-07 DIAGNOSIS — Z7982 Long term (current) use of aspirin: Secondary | ICD-10-CM | POA: Diagnosis not present

## 2017-09-07 DIAGNOSIS — Z79899 Other long term (current) drug therapy: Secondary | ICD-10-CM | POA: Diagnosis not present

## 2017-09-07 DIAGNOSIS — M62830 Muscle spasm of back: Secondary | ICD-10-CM | POA: Diagnosis not present

## 2017-09-07 DIAGNOSIS — I10 Essential (primary) hypertension: Secondary | ICD-10-CM | POA: Insufficient documentation

## 2017-09-07 LAB — URINALYSIS, COMPLETE (UACMP) WITH MICROSCOPIC
BACTERIA UA: NONE SEEN
Bilirubin Urine: NEGATIVE
Glucose, UA: NEGATIVE mg/dL
Hgb urine dipstick: NEGATIVE
Ketones, ur: NEGATIVE mg/dL
Leukocytes, UA: NEGATIVE
NITRITE: NEGATIVE
PROTEIN: NEGATIVE mg/dL
SQUAMOUS EPITHELIAL / LPF: NONE SEEN (ref 0–5)
Specific Gravity, Urine: 1.006 (ref 1.005–1.030)
pH: 7 (ref 5.0–8.0)

## 2017-09-07 MED ORDER — DEXAMETHASONE SODIUM PHOSPHATE 10 MG/ML IJ SOLN
10.0000 mg | Freq: Once | INTRAMUSCULAR | Status: AC
  Administered 2017-09-07: 10 mg via INTRAMUSCULAR
  Filled 2017-09-07: qty 1

## 2017-09-07 MED ORDER — METHOCARBAMOL 500 MG PO TABS: 250.0000 mg | ORAL_TABLET | Freq: Three times a day (TID) | ORAL | 0 refills | Status: DC | PRN

## 2017-09-07 MED ORDER — ORPHENADRINE CITRATE 30 MG/ML IJ SOLN
30.0000 mg | Freq: Once | INTRAMUSCULAR | Status: AC
  Administered 2017-09-07: 30 mg via INTRAMUSCULAR
  Filled 2017-09-07: qty 2

## 2017-09-07 MED ORDER — PREDNISONE 50 MG PO TABS: 50.0000 mg | ORAL_TABLET | Freq: Every day | ORAL | 0 refills | Status: DC

## 2017-09-07 NOTE — ED Notes (Signed)

## 2017-09-07 NOTE — ED Provider Notes (Signed)
[facility] Emergency Department Provider Note  ____________________________________________  Time seen: Approximately 6:46 PM  I have reviewed the triage vital signs and the nursing notes.   HISTORY  Chief Complaint Back Pain    HPI Leslie Johnston is a 84 y.o. female who presents to emergency department complaining of lower back pain. Patient reports that pain is bilateral, described as a catching/spasm sensation. Patient reports that it is in the "very low back." She denies any radicular symptoms down bilateral lower extremities. She denies any hip pain. Patient does have a history of a fall approximately a year ago at cause 3 small nondisplaced fractures of the pelvis, as well as a humerus fracture. These healed without complication. Patient denies any trauma to the region. The patient and her daughter also concerned as she does have a significant history of recurrent UTIs. Patient states that she has had no dysuria, polyuria, hematuria. She denies any flank pain. She denies any abdominal pain. No history of aortic aneurysm. Patient denies any other complaints at this time. She has not tried any medication for this complaint prior to arrival.    Past Medical History:  Diagnosis Date  . Allergy   . Hypertension     Patient Active Problem List   Diagnosis Date Noted  . Postmenopausal osteoporosis with pathological fracture of humerus 09/13/2016  . Humerus fracture 08/29/2016  . Pelvic fracture ([place]) 08/29/2016  . Chronic seasonal allergic rhinitis 03/20/2016  . Abnormal chest x-ray 09/19/2015  . HTN (hypertension) 07/27/2014    Past Surgical History:  Procedure Laterality Date  . EYE SURGERY     cataract extraction- Right    Prior to Admission medications   Medication Sig Start Date End Date Taking? Authorizing Provider  acetaminophen (TYLENOL) 500 MG tablet Take 500 mg by mouth every 6 (six) hours as needed.    [provider]  aspirin  81 MG tablet Take 81 mg by mouth daily.    [provider]  calcium citrate-vitamin D (CITRACAL+D) 315-200 MG-UNIT tablet Take 1 tablet by mouth 2 (two) times daily.    [provider]  fluticasone (FLONASE) 50 MCG/ACT nasal spray SHAKE LIQUID AND USE 2 SPRAYS IN [facility] NOSTRIL DAILY 07/08/17   [name], NP  hydrochlorothiazide (HYDRODIURIL) 12.5 MG tablet Take 1 tablet (12.5 mg total) by mouth daily. 08/13/17   [name], NP  lisinopril (PRINIVIL,ZESTRIL) 10 MG tablet Take 1 tablet (10 mg total) by mouth daily. 08/13/17   [name], NP  methocarbamol (ROBAXIN) 500 MG tablet Take 0.5 tablets (250 mg total) by mouth every 8 (eight) hours as needed for muscle spasms. 09/07/17   Cuthriell, Charline Bills, PA-C  Multiple Vitamin (MULTIVITAMIN WITH MINERALS) TABS tablet Take 1 tablet by mouth daily.    [provider]  predniSONE (DELTASONE) 50 MG tablet Take 1 tablet (50 mg total) by mouth daily with breakfast. 09/07/17   Cuthriell, Charline Bills, PA-C    Allergies Amlodipine; Losartan; and Penicillins  Family History  Problem Relation Age of Onset  . Pancreatic cancer Sister   . Arthritis Maternal Grandmother   . Leukemia Maternal Grandfather   . Asthma Maternal Grandfather   . Breast cancer Daughter   . Bladder Cancer Daughter     Social History Social History   Tobacco Use  . Smoking status: Never Smoker  . Smokeless tobacco: Never Used  Substance Use Topics  . Alcohol use: No    Alcohol/week: 0.0 standard drinks  .  Drug use: No     Review of Systems  Constitutional: No fever/chills Eyes: No visual changes. Cardiovascular: no chest pain. Respiratory: no cough. No SOB. Gastrointestinal: No abdominal pain.  No nausea, no vomiting.  No diarrhea.  No constipation. Genitourinary: Negative for dysuria. No hematuria Musculoskeletal: positive for bilateral lower back pain. No radicular symptoms. Skin: Negative for rash, abrasions,  lacerations, ecchymosis. Neurological: Negative for headaches, focal weakness or numbness. 10-point ROS otherwise negative.  ____________________________________________   PHYSICAL EXAM:  VITAL SIGNS: ED Triage Vitals  Enc Vitals Group     BP 09/07/17 1722 (!) 146/89     Pulse Rate 09/07/17 1722 93     Resp 09/07/17 1722 14     Temp 09/07/17 1722 98.6 F (37 C)     Temp Source 09/07/17 1722 Oral     SpO2 09/07/17 1722 92 %     Weight 09/07/17 1721 140 lb (63.5 kg)     Height 09/07/17 1721 5' 7.5" (1.715 m)     Head Circumference --      Peak Flow --      Pain Score 09/07/17 1728 8     Pain Loc --      Pain Edu? --      Excl. in [place]? --      Constitutional: Alert and oriented. Well appearing and in no acute distress. Eyes: Conjunctivae are normal. PERRL. EOMI. Head: Atraumatic. ENT:      Ears:       Nose: No congestion/rhinnorhea.      Mouth/Throat: Mucous membranes are moist.  Neck: No stridor.    Cardiovascular: Normal rate, regular rhythm. Normal S1 and S2.  Good peripheral circulation. Respiratory: Normal respiratory effort without tachypnea or retractions. Lungs CTAB. Good air entry to the bases with no decreased or absent breath sounds. Gastrointestinal: Bowel sounds 4 quadrants. Soft and nontender to palpation. No guarding or rigidity. No palpable masses. No distention. No CVA tenderness. Musculoskeletal: Full range of motion to all extremities. No gross deformities appreciated.visual immunization in the lumbar spine reveals no deformity, ecchymosis, abrasions or lacerations. No tenderness to palpation midline or left paraspinal muscle region. Patient is very tender to palpation over the right-sided paraspinal muscle group. Patient is most tender to palpation in the L4-S1 distribution of the paraspinal muscle group. Palpation in this area elicits symptoms the patient has been complaining of. No tenderness to palpation over bilateral sciatic notches. Negative straight  leg raise bilaterally. Dorsalis beats pulse intact bilateral lower extremity. Sensation intact and equal bilateral lower shin 80s. Neurologic:  Normal speech and language. No gross focal neurologic defre appreciated.  Skin:  Skin is warm, dry and intact. No rash noted. Psychiatric: Mood and affect are normal. Speech and behavior are normal. Patient exhibits appropriate insight and judgement.   ____________________________________________   LABS (all labs ordered are listed, but only abnormal results are displayed)  Labs Reviewed  URINALYSIS, COMPLETE (UACMP) WITH MICROSCOPIC - Abnormal; Notable for the following components:      Result Value   Color, Urine STRAW (*)    APPearance CLEAR (*)    All other components within normal limits   ____________________________________________  EKG   ____________________________________________  RADIOLOGY I personally viewed and evaluated these images as part of my medical decision making, as well as reviewing the written report by the radiologist.I concur 3 doses finding of degenerative disc disease but no acute osseous abnormality.  Dg Lumbar Spine Complete  Result Date: 09/07/2017 CLINICAL DATA:  Low back  pain for the past 2 days. No known injury. EXAM: LUMBAR SPINE - COMPLETE 4+ VIEW COMPARISON:  Pelvis and left hip dated 12/04/2013. FINDINGS: Five non-rib-bearing lumbar vertebrae. Facet degenerative changes in the lower lumbar spine. Associated grade 1 anterolisthesis at the L4-5 L5-S1 levels. No pars defects or fractures are seen. Mild anterior and lateral spur formation at multiple levels of the lumbar and lower thoracic spine. Multiple gallstones in the gallbladder measuring up to 1.4 cm in maximum diameter each. IMPRESSION: 1. Degenerative changes, as described above. 2. Cholelithiasis. Electronically Signed   By: Claudie Revering M.D.   On: 09/07/2017 19:50    ____________________________________________    PROCEDURES  Procedure(s)  performed:    Procedures    Medications  dexamethasone (DECADRON) injection 10 mg (has no administration in time range)  orphenadrine (NORFLEX) injection 30 mg (has no administration in time range)     ____________________________________________   INITIAL IMPRESSION / ASSESSMENT AND PLAN / ED COURSE  Pertinent labs & imaging results that were available during my care of the patient were reviewed by me and considered in my medical decision making (see chart for details).  Review of the [place] CSRS was performed in accordance of the [place] prior to dispensing any controlled drugs.  Clinical Course as of Sep 07 2021  Sat Sep 07, 2017  1852 He presents emergency department complaining of lower back pain. This is nontraumatic in nature. No history of same. Symptoms sound muscular nature. However differential includes UTI, ruptured disc, lumbar fracture, sciatica, aneurysm. Exam is overall reassuring. Exam is most consistent with muscular spasm. Urinalysis will be obtained. X-ray will also be ordered for further evaluation. No indication for labs at this time.   [JC]    Clinical Course User Index [JC] Cuthriell, Charline Bills, PA-C     Patient's diagnosis is consistent with muscle spasms in the paraspinal muscle.presents emergency department with intermittent spasm-like symptoms to the lower back. Exam is consistent with tenderness to palpation over the right paraspinal muscle group. No other significant findings. Urinalysis returns with no indication of UTI. X-ray reveals degenerative disc disease with no acute fractures. After lengthy discussion with the patient and her family, I will try the patient on a low-dose muscle relaxer as well as steroid. Patient will have family members with her while taking muscle relaxer due to side effect profile. The patient and family members verbalized understanding of same. I have also encouraged the use of electrolyte in his fluids such as Gatorade, propel,  vitamin water. They verbalized understanding of same.  Patient is given ED precautions to return to the ED for any worsening or new symptoms.     ____________________________________________  FINAL CLINICAL IMPRESSION(S) / ED DIAGNOSES  Final diagnoses:  Lumbar paraspinal muscle spasm      NEW MEDICATIONS STARTED DURING THIS VISIT:  ED Discharge Orders         Ordered    predniSONE (DELTASONE) 50 MG tablet  Daily with breakfast     09/07/17 2023    methocarbamol (ROBAXIN) 500 MG tablet  Every 8 hours PRN     09/07/17 2023              This chart was dictated using voice recognition software/Dragon. Despite best efforts to proofread, errors can occur which can change the meaning. Any change was purely unintentional.    Darletta Moll, PA-C 09/07/17 2024    Lavonia Drafts, MD 09/07/17 2036

## 2017-09-07 NOTE — ED Triage Notes (Signed)
FIRST NURSE NOTE-here for back pain. In wheel chair. Alert, NAD

## 2017-09-07 NOTE — ED Triage Notes (Signed)
Pt c/o low back pain x 2 days. Alert, oriented, in wheelchair. Walks with cane. Denies urinary symptoms./

## 2017-09-07 NOTE — ED Notes (Signed)
Pt having back pain - needed moderate assistance with walking - states "when the pain hits me it almost takes me down".

## 2017-09-10 ENCOUNTER — Telehealth: Payer: Self-pay | Admitting: Nurse Practitioner

## 2017-09-10 NOTE — Telephone Encounter (Signed)
No additional advice.  May take up to 4-6 weeks for resolution.

## 2017-09-10 NOTE — Telephone Encounter (Signed)
Pt was recently in ER and daughter [name] has a question about back spasms [phone]

## 2017-09-10 NOTE — Telephone Encounter (Signed)
The pt daughter called with concerns about her mothers back spasms. She was seen in the ER on Saturday and diagnose w/ back spasm. Prescribed  prednisone & methocarbamol and notice improvement, but her symptoms are not resolved. I encourage the daughter that this could take more than a few days or weeks for the back pain to resolve. I encouraged her as long as her symptoms are improving to continue to do what they recommendation. If her symptoms worsen or not improve to call us and schedule an appointment. She verbalize understanding.

## 2017-09-11 ENCOUNTER — Telehealth: Payer: Self-pay | Admitting: Nurse Practitioner

## 2017-09-11 DIAGNOSIS — M62830 Muscle spasm of back: Secondary | ICD-10-CM

## 2017-09-11 MED ORDER — METHOCARBAMOL 500 MG PO TABS: 250.0000 mg | ORAL_TABLET | Freq: Three times a day (TID) | ORAL | 0 refills | Status: DC | PRN

## 2017-09-11 NOTE — Telephone Encounter (Signed)
Refill sent.

## 2017-09-11 NOTE — Telephone Encounter (Signed)
Leslie Johnston said pt only has 1 methocarbamol left.  She asked if she could have a refill sent to [facility] in [place].  Her call back [phone]

## 2017-09-12 NOTE — Telephone Encounter (Signed)
Attempted to contact the pt daughter to notify her that the prescription was sent  Over to her pharmacist.

## 2017-09-19 ENCOUNTER — Ambulatory Visit (INDEPENDENT_AMBULATORY_CARE_PROVIDER_SITE_OTHER): Payer: Medicare Other | Admitting: Nurse Practitioner

## 2017-09-19 ENCOUNTER — Encounter: Payer: Self-pay | Admitting: Nurse Practitioner

## 2017-09-19 ENCOUNTER — Other Ambulatory Visit: Payer: Self-pay

## 2017-09-19 VITALS — BP 118/72 | HR 93 | Temp 98.3°F | Ht 67.5 in | Wt 138.0 lb

## 2017-09-19 DIAGNOSIS — F0391 Unspecified dementia with behavioral disturbance: Secondary | ICD-10-CM

## 2017-09-19 DIAGNOSIS — L821 Other seborrheic keratosis: Secondary | ICD-10-CM | POA: Diagnosis not present

## 2017-09-19 NOTE — Patient Instructions (Addendum)
Leslie Johnston,   Thank you for coming in to clinic today.  1. Your mole is not cancerous.  We will still send you for a regular skin cancer check at dermatology.  Call with old dermatologist info.   Please schedule a follow-up appointment with Cassell Smiles, AGNP. Return if symptoms worsen or fail to improve.  If you have any other questions or concerns, please feel free to call the clinic or send a message through [name]. You may also schedule an earlier appointment if necessary.  You will receive a survey after today's visit either digitally by e-mail or paper by [name]. Your experiences and feedback matter to [name].  Please respond so we know how we are doing as we provide care for you.   Cassell Smiles, DNP, AGNP-BC Adult Gerontology Nurse Practitioner [place]

## 2017-09-19 NOTE — Progress Notes (Signed)
Subjective:    Patient ID: Leslie Johnston, female    DOB: 12-14-1925, 84 y.o.   MRN: [id number]  Leslie Johnston is a 84 y.o. female presenting on 09/19/2017 for Nevus (2-3 days ) and Memory Loss   HPI Nevus Patient notes new nevus, raised and painful on LEFT side of neck at lower mandible.  This is becoming an obsession according to patient's daugther.  Memory Loss Memory is pregressively worsening.  Patient's daughter states she is forgetting thing sand repeating the same thing over and over within 5 minutes.  She is also asking same question repetitively.  These things are concerning to family and they would like to know if there are any interventions to consider to help.  They would like to reduce her agitation.  Social History   Tobacco Use  . Smoking status: Never Smoker  . Smokeless tobacco: Never Used  Substance Use Topics  . Alcohol use: No    Alcohol/week: 0.0 standard drinks  . Drug use: No    Review of Systems Per HPI unless specifically indicated above     Objective:    BP 118/72 (BP Location: Right Arm, Patient Position: Sitting, Cuff Size: Normal)   Pulse 93   Temp 98.3 F (36.8 C) (Oral)   Ht 5' 7.5" (1.715 m)   Wt 138 lb (62.6 kg)   BMI 21.29 kg/m   Wt Readings from Last 3 Encounters:  09/19/17 138 lb (62.6 kg)  09/07/17 140 lb (63.5 kg)  08/13/17 136 lb 9.6 oz (62 kg)    Physical Exam  Constitutional: She is oriented to person, place, and time. She appears well-developed and well-nourished. No distress.  HENT:  Head: Normocephalic and atraumatic.  Neck:    Neurological: She is alert and oriented to person, place, and time.  Skin: Skin is warm and dry. Capillary refill takes less than 2 seconds.  Psychiatric: Thought content normal. Her speech is tangential. She is agitated (Pt does not like talking about memory and gets agitated.). Cognition and memory are impaired. She expresses impulsivity and inappropriate judgment. She exhibits a  depressed mood. She exhibits abnormal recent memory and abnormal remote memory.  Vitals reviewed.  Results for orders placed or performed during the hospital encounter of 09/07/17  Urinalysis, Complete w Microscopic  Result Value Ref Range   Color, Urine STRAW (A) YELLOW   APPearance CLEAR (A) CLEAR   Specific Gravity, Urine 1.006 1.005 - 1.030   pH 7.0 5.0 - 8.0   Glucose, UA NEGATIVE NEGATIVE mg/dL   Hgb urine dipstick NEGATIVE NEGATIVE   Bilirubin Urine NEGATIVE NEGATIVE   Ketones, ur NEGATIVE NEGATIVE mg/dL   Protein, ur NEGATIVE NEGATIVE mg/dL   Nitrite NEGATIVE NEGATIVE   Leukocytes, UA NEGATIVE NEGATIVE   RBC / HPF 0-5 0 - 5 RBC/hpf   WBC, UA 0-5 0 - 5 WBC/hpf   Bacteria, UA NONE SEEN NONE SEEN   Squamous Epithelial / LPF NONE SEEN 0 - 5      Assessment & Plan:   Problem List Items Addressed This Visit    None    Visit Diagnoses    Seborrheic keratosis    -  Primary Stable and without likely malignancy.  Patient likely has been picking and irritates the seborrheic keratosis.  Plan: 1. May cover skin with bandage to prevent picking. 2. Dermatology referral for skin check is recommended for patient's concern for skin cancer. 3. Follow-up prn.    Dementia with behavioral disturbance, unspecified dementia  type ([place])     Worsening with agitation and mild anxiety, depression.  Family is unable to discuss with me in room, so follow-up phone call was made. - Consider adding aricept and/or SSRI in future. - Explained normal process for dementia is progressive.  Not reversible. - Provided [employer] book for information. - Follow-up prn and as scheduled.       Follow up plan: Return if symptoms worsen or fail to improve AND in February 2020 for hypertension.  Cassell Smiles, DNP, AGPCNP-BC Adult Gerontology Primary Care Nurse Practitioner [facility] 09/19/2017, 3:30 PM

## 2017-09-20 ENCOUNTER — Telehealth: Payer: Self-pay | Admitting: Nurse Practitioner

## 2017-09-20 NOTE — Telephone Encounter (Signed)
Pt daughter have requested that you call her Monday to talk about a referral

## 2017-09-20 NOTE — Telephone Encounter (Signed)
Leslie Johnston is daughter that accompanies pt to appointments.   - Increasing agitation and continues through evenings, feeling that others are mean. Incontinence episodes in last few days.     - Leslie Johnston is pt's other daughter.  She helps laundry, bed (sometimes limited by pain), sometimes with dishes.  Last couple of weeks has needed more help.  Walking w cane after stabbing pain.  Afraid of falling.   - Also helps w baths into and out of tub.   Leslie Johnston always stays at night. Brother also stays at night when needed. - Offered to start Aricept 5 mg once daily.  Patient wants to continue to discuss this.   - Declines dermatology.   - Offered neurology referral.  Desires to discuss with family first.

## 2017-09-23 ENCOUNTER — Telehealth: Payer: Self-pay | Admitting: Nurse Practitioner

## 2017-09-23 DIAGNOSIS — F02818 Dementia in other diseases classified elsewhere, unspecified severity, with other behavioral disturbance: Secondary | ICD-10-CM

## 2017-09-23 DIAGNOSIS — F0281 Dementia in other diseases classified elsewhere with behavioral disturbance: Secondary | ICD-10-CM

## 2017-09-23 MED ORDER — DONEPEZIL HCL 5 MG PO TABS: 5.0000 mg | ORAL_TABLET | Freq: Every day | ORAL | 5 refills | Status: DC

## 2017-09-23 NOTE — Telephone Encounter (Signed)
Leslie Johnston, Please call Britt Boozer to relay this info.

## 2017-09-23 NOTE — Telephone Encounter (Signed)
Attempted to contact the pt daughter, no answer. LMOM to return my call.  

## 2017-09-23 NOTE — Telephone Encounter (Signed)
Daughter's discussion with family:   - No neurology at this time is desired.  Family believes this will agitate patient more.  Will start Aricept 5 mg once daily.    Frequent nocturnal urination is concern.  No leg swelling.  Would like to try stopping hydrochlorothiazide.  Willing to do this.   Keep BP goal < 130/80.

## 2017-09-24 NOTE — Telephone Encounter (Signed)
The pt daughter was notified, no questions or concern.

## 2017-10-02 ENCOUNTER — Telehealth: Payer: Self-pay | Admitting: Nurse Practitioner

## 2017-10-02 DIAGNOSIS — M62830 Muscle spasm of back: Secondary | ICD-10-CM

## 2017-10-02 MED ORDER — BACLOFEN 10 MG PO TABS: 5.0000 mg | ORAL_TABLET | Freq: Three times a day (TID) | ORAL | 0 refills | Status: DC | PRN

## 2017-10-02 NOTE — Telephone Encounter (Signed)
I sent baclofen.  Take 1/2 - 1 tab up to three times daily prn for back spasms.  If not as effective as methocarbamol, can call clinic.  Should be effective and less sedating.

## 2017-10-02 NOTE — Telephone Encounter (Signed)
Pt daughter called states that her mother started having back spasms wanted to know if you would call something in to [facility] Pt call back # is  [phone]

## 2017-10-02 NOTE — Telephone Encounter (Signed)
Left detail message. 

## 2017-10-04 ENCOUNTER — Telehealth: Payer: Self-pay | Admitting: Nurse Practitioner

## 2017-10-04 NOTE — Telephone Encounter (Signed)
Call placed to [facility], patient's daughter. - This is not a service we could have done today, as I am not trained to do this and we had no open appointments. If patient has specific trigger points, this is something Dr. Raliegh Ip could do during an office visit.  [name] would like to schedule for 8:00 am Monday 10/07/2017 for eval/treat with consideration of trigger point injection.  Discussed that if no trigger point is actually present, procedure may not be performed. - Also recommended physical therapy.  Patient and family will decide early next week about this treatment option. - Baclofen is helping equally as well as Robaxin.  Both caused significant drowsiness.  I do not feel it is appropriate at this time to try a different muscle relaxer without another exam.  It is possible that Patient will not be agreeable to that early am appointment per [facility].  Requested they call as soon as able if not going to make this appointment.

## 2017-10-04 NOTE — Telephone Encounter (Signed)
Pt went to chiropractor today and was told there is a shot for deep muscle spasms and [name] asked if she could come to office today to get one [phone]

## 2017-10-07 ENCOUNTER — Ambulatory Visit (INDEPENDENT_AMBULATORY_CARE_PROVIDER_SITE_OTHER): Payer: Medicare Other | Admitting: Family Medicine

## 2017-10-07 ENCOUNTER — Encounter: Payer: Self-pay | Admitting: Family Medicine

## 2017-10-07 VITALS — BP 162/84 | HR 92 | Temp 98.5°F | Resp 16 | Ht 67.5 in | Wt 138.0 lb

## 2017-10-07 DIAGNOSIS — M545 Low back pain: Secondary | ICD-10-CM

## 2017-10-07 DIAGNOSIS — G8929 Other chronic pain: Secondary | ICD-10-CM

## 2017-10-07 DIAGNOSIS — M549 Dorsalgia, unspecified: Secondary | ICD-10-CM

## 2017-10-07 DIAGNOSIS — M25552 Pain in left hip: Secondary | ICD-10-CM

## 2017-10-07 MED ORDER — TRIAMCINOLONE ACETONIDE 40 MG/ML IJ SUSP
20.0000 mg | Freq: Once | INTRAMUSCULAR | Status: AC
  Administered 2017-10-07: 20 mg via INTRAMUSCULAR

## 2017-10-07 MED ORDER — LIDOCAINE HCL (PF) 1 % IJ SOLN
2.0000 mL | Freq: Once | INTRAMUSCULAR | Status: AC
  Administered 2017-10-07: 2 mL via INTRADERMAL

## 2017-10-07 MED ORDER — TRAMADOL HCL 50 MG PO TABS: 50.0000 mg | ORAL_TABLET | Freq: Three times a day (TID) | ORAL | 0 refills | Status: DC | PRN

## 2017-10-07 NOTE — Progress Notes (Addendum)
Subjective:    Patient ID: Leslie Johnston, female    DOB: Jan 25, 1925, 84 y.o.   MRN: [id number]  Leslie Johnston is a 84 y.o. female presenting on 10/07/2017 for Back Pain  History provided by both patient and her daughters, x 2 present Dee and Kay  HPI   LOW BACK / HIP PAIN / Osteoarthritis / MUSCLE HYPERTONICITY Reports worsening low back pain over past 1 month. She did not have new injury without any fall or other trauma. She was seen in ED recently 09/2017 and seen by PCP, she has had Lumbar X-ray showed OA/DJD no acute problem and treated with steroid prednisone dose pak and also muscle relaxants, methocarbamol and baclofen. - Taking Aleve, Tylenol rotation every few hours - She has been to chiropractor last week on Friday 10/4, they thought it was more deep muscular spasm - Has not done PT for this low back pain problem. She did PT for shoulder in past. In past used Tramadol PRN back in 10/2016 due to fall, did well but temporary only. - Today asking about injection into muscles - Pain worse at night, will wake up in pain at times, stiffen up unable to get comfortable Denies radiating or radicular nerve pain, numbness weakness or tingling, new fall or trauma, swelling, redness, fever chills  Depression screen [facility] 2/9 07/09/2017 09/11/2016 06/19/2016  Decreased Interest 0 0 0  Down, Depressed, Hopeless 0 0 0  PHQ - 2 Score 0 0 0    Social History   Tobacco Use  . Smoking status: Never Smoker  . Smokeless tobacco: Never Used  Substance Use Topics  . Alcohol use: No    Alcohol/week: 0.0 standard drinks  . Drug use: No    Review of Systems Per HPI unless specifically indicated above     Objective:    BP (!) 162/84 (BP Location: Left Arm, Cuff Size: Normal)   Pulse 92   Temp 98.5 F (36.9 C) (Oral)   Resp 16   Ht 5' 7.5" (1.715 m)   Wt 138 lb (62.6 kg)   BMI 21.29 kg/m   Wt Readings from Last 3 Encounters:  10/07/17 138 lb (62.6 kg)  09/19/17 138 lb (62.6 kg)    09/07/17 140 lb (63.5 kg)    Physical Exam  Constitutional: She is oriented to person, place, and time. She appears well-developed and well-nourished. No distress.  Elderly 84 year old female uncomfortable with position changes due to low back / gluteal pain, cooperative  HENT:  Head: Normocephalic and atraumatic.  Mouth/Throat: Oropharynx is clear and moist.  Eyes: Conjunctivae are normal. Right eye exhibits no discharge. Left eye exhibits no discharge.  Cardiovascular: Normal rate.  Pulmonary/Chest: Effort normal.  Musculoskeletal: She exhibits no edema.  Low Back / Hip / Gluteal Inspection: BACK - Normal appearance, no spinal deformity, symmetrical. HIP - Normal appearance, symmetrical, no obvious leg length or pelvis deformity  Palpation: BACK - Some minimal tenderness over lumbar spinous processes questionable on exam does not seem consistent. Bilateral lumbar paraspinal muscles mild tender and without hypertonicity/spasm. More localized tenderness over LEFT gluteal SI region with muscle spasm and hypertonicity trigger point. HIP - No obvious tenderness over bilateral greater trochanter region of lateral upper thigh  ROM: BACK - limited active ROM forward flex / back extension, rotation L/R due to low back pain and cooperation HIP - Bilateral hip flex/ext somewhat reduced by positioning limitations but seated hip internal rotation LEFT painful.  Special Testing: BACK -  Seated SLR negative for radicular pain bilaterally - left reproduces some local back pain  Strength: bilateral distal lower ext str intact, limited participation Neurovascular: intact distal sensation to light touch   Neurological: She is alert and oriented to person, place, and time.  Skin: Skin is warm and dry. No rash noted. She is not diaphoretic. No erythema.  Psychiatric: She has a normal mood and affect. Her behavior is normal.  Well groomed, good eye contact, normal speech and thoughts  Nursing note  and vitals reviewed.  ________________________________________________________ PROCEDURE NOTE Date: 10/07/17 Trigger point injection, Left sacroiliac gluteal x 2 Discussed benefits and risks (including pain, bleeding, infection, steroid flare). Verbal consent given by patient. Medication:  0.5 cc Depo-medrol 40mg  and 2 cc Lidocaine 1% without epi Time Out taken  Trigger Point Landmarks identified located with palpable muscle knot and localized tenderness based on her response however difficult to exactly localize due to complaints of pain in large area. Area cleansed with alcohol wipes, cold spray used for superficial anesthetic. Using 23 gauge needle, medicine was injected in a wheel fashion in the area. Additional adjacent area repeated. Sterile bandage placed. Patient tolerated procedure well without bleeding or paresthesias. No complications.  I have personally reviewed the radiology report from 09/07/17 on Lumbar X-ray.  CLINICAL DATA:  Low back pain for the past 2 days. No known injury.  EXAM: LUMBAR SPINE - COMPLETE 4+ VIEW  COMPARISON:  Pelvis and left hip dated 12/04/2013.  FINDINGS: Five non-rib-bearing lumbar vertebrae. Facet degenerative changes in the lower lumbar spine. Associated grade 1 anterolisthesis at the L4-5 L5-S1 levels. No pars defects or fractures are seen. Mild anterior and lateral spur formation at multiple levels of the lumbar and lower thoracic spine. Multiple gallstones in the gallbladder measuring up to 1.4 cm in maximum diameter each.  IMPRESSION: 1. Degenerative changes, as described above. 2. Cholelithiasis.   Electronically Signed   By: Claudie Revering M.D.   On: 09/07/2017 19:50      Assessment & Plan:   Problem List Items Addressed This Visit    None    Visit Diagnoses    Chronic bilateral low back pain without sciatica    -  Primary   Relevant Medications   traMADol (ULTRAM) 50 MG tablet   triamcinolone acetonide (KENALOG-40)  injection 20 mg (Completed) (Start on 10/07/2017  9:15 AM)   Left hip pain       Relevant Medications   traMADol (ULTRAM) 50 MG tablet   Trigger point with back pain       Relevant Medications   traMADol (ULTRAM) 50 MG tablet   lidocaine (PF) (XYLOCAINE) 1 % injection 2 mL (Completed) (Start on 10/07/2017  9:15 AM)   triamcinolone acetonide (KENALOG-40) injection 20 mg (Completed) (Start on 10/07/2017  9:15 AM)      Subacute on chronic bilateral LBP vs L>R sacro-iliac / gluteal pain without sciatica - recently worse with muscle hypertonicity spasm and stiffness, affecting her sleep and function No known injury or trauma / fall In setting of known OA/DJD underlying etiology of chronic pain No other associated red flag symptoms. Negative radiculopathy Limited improvement on NSAID Tylenol Muscle relaxants, Steroids Last X-ray Lumbar ED 09/2017  Plan: 1. Trigger point injection x 2 today L sacroiliac - see procedure note, some mild relief following injection immediately but still generalized sore with position change 2. Rx Tramadol 50mg  take 1-2 q 8 hr PRN for pain - emphasis for breakthrough pain only, caution sedation, take at  night primarily - has been on this before in past 10/2016, has tolerated, advised short term likely only pending course may need longer term 3. Continue muscle relaxant, NSAID, Tylenol, heating pad, muscle rub 4. Referral handwritten given to [facility] - requesting dry needling and TENs, muscle techniques 5. Follow-up 4-6 weeks if not improved for re-evaluation, consider referral to orthopedics in future - may also benefit from other medication options such as Gabapentin, may need deeper spine / SI joint injections - also will expand differential if not responding  Meds ordered this encounter  Medications  . traMADol (ULTRAM) 50 MG tablet    Sig: Take 1-2 tablets (50-100 mg total) by mouth every 8 (eight) hours as needed for severe pain.    Dispense:  30 tablet     Refill:  0  . lidocaine (PF) (XYLOCAINE) 1 % injection 2 mL  . triamcinolone acetonide (KENALOG-40) injection 20 mg    Follow up plan: Return in about 4 weeks (around 11/04/2017), or if symptoms worsen or fail to improve, for back pain.  Nobie Putnam, [facility] 10/07/2017, 9:06 AM

## 2017-10-07 NOTE — Patient Instructions (Addendum)
Thank you for coming to the office today.  Trigger point injection today numbing medicine and steroid injected into tight spasm muscle that may be causing or contributing to back / hip / gluteal pain.  This injection may take a few hours to take effect and will help reduce spasm. It is not a long term solution but may help [name] learn more in short term if pain improves.  Continue current medicines for pain.  Also added Tramadol - take one tablet every 8 hours or 3 times a day as needed for pain and may take TWO pills at once if severe pain especially preferred only at night.  Referral to [facility] for additional evaluation and procedural intervention for muscles.  If not effective, last option would be Orthopedic / Spine referral for more significant deeper joint injections hip and or back / spine.  Will review this with her PCP Cassell Smiles, AGPCNP-BC and determine next plan and options.  Please schedule a Follow-up Appointment to: Return in about 4 weeks (around 11/04/2017), or if symptoms worsen or fail to improve, for back pain.  If you have any other questions or concerns, please feel free to call the office or send a message through [place]. You may also schedule an earlier appointment if necessary.  Additionally, you may be receiving a survey about your experience at our office within a few days to 1 week by e-mail or mail. We value your feedback.  Nobie Putnam, DO [place]

## 2017-10-07 NOTE — Addendum Note (Signed)
Addended by: Olin Hauser on: 10/07/2017 09:06 AM   Modules accepted: Orders

## 2017-10-08 ENCOUNTER — Ambulatory Visit: Payer: Medicare Other | Admitting: Family Medicine

## 2017-10-11 ENCOUNTER — Telehealth: Payer: Self-pay | Admitting: Nurse Practitioner

## 2017-10-11 NOTE — Telephone Encounter (Signed)
I spoke with the pt daughter she requesting a refill on her mother Tramadol that was just prescribed on Monday. The pt is currently taking two 50 MG tablets of Tramadol every 8 hours. She also is concern because the patient been having frequent acute urinary incontinence since last night. She states the patient woke up last night and said she had to go to the bathroom and was unable to urinate when she got there, but as soon as she proceeded to walk back to her bedroom she had an accident. This has seen happen several times today. The pt daughter also reports that her blood pressure lastnight was 206/110 she seems to think its associated with her pain. Today her blood pressure is 169/96. I informed the patient that I would have to get the provider to call her back. She verbalize understanding. [name] [phone].

## 2017-10-11 NOTE — Telephone Encounter (Signed)
[name] said pt was almost out of pain medication.  She also asked about bringing a urine sample in because pt has been wetting herself.  Please call [phone].  She has to work until 1:00.

## 2017-10-14 ENCOUNTER — Telehealth: Payer: Self-pay | Admitting: Family Medicine

## 2017-10-14 DIAGNOSIS — M545 Low back pain: Principal | ICD-10-CM

## 2017-10-14 DIAGNOSIS — M549 Dorsalgia, unspecified: Secondary | ICD-10-CM

## 2017-10-14 DIAGNOSIS — M25552 Pain in left hip: Secondary | ICD-10-CM

## 2017-10-14 DIAGNOSIS — G8929 Other chronic pain: Secondary | ICD-10-CM

## 2017-10-14 MED ORDER — TRAMADOL HCL 50 MG PO TABS: 50.0000 mg | ORAL_TABLET | Freq: Three times a day (TID) | ORAL | 0 refills | Status: DC | PRN

## 2017-10-14 NOTE — Telephone Encounter (Signed)
Replied with other encounter.

## 2017-10-14 NOTE — Telephone Encounter (Signed)
Pt starts PT tomorrow and [name] asked if it would be possible to get a refill on pain medication [phone]

## 2017-10-14 NOTE — Telephone Encounter (Signed)
The pt daughter is calling back again. She called on Friday requesting that we send in a prescription of tramadol. Please see previous message. The daughter would like the medication called in prior to her physical therapy appointment on tomorrow.

## 2017-10-14 NOTE — Telephone Encounter (Signed)
Patient has had minimal improvement on Tramadol unless taking 100 mg per dose.  Has not used much Tylenol in conjunction.  - Instructed to dose with Tramadol 50 mg with Tylenol 500 mg one tablet every 8 hours prn. - May take Tramadol 100 mg dose only with physical therapy.  Reassured family that reported BP elevations are likely associated with pain.  Patient also continues to have urinary symptoms.  Appointment is already scheduled for 10/15/2017 for this.

## 2017-10-15 ENCOUNTER — Encounter: Payer: Self-pay | Admitting: Nurse Practitioner

## 2017-10-15 ENCOUNTER — Other Ambulatory Visit: Payer: Self-pay

## 2017-10-15 ENCOUNTER — Other Ambulatory Visit: Payer: Self-pay | Admitting: Nurse Practitioner

## 2017-10-15 ENCOUNTER — Ambulatory Visit (INDEPENDENT_AMBULATORY_CARE_PROVIDER_SITE_OTHER): Payer: Medicare Other | Admitting: Nurse Practitioner

## 2017-10-15 VITALS — BP 165/92 | HR 97 | Temp 98.4°F | Ht 67.5 in | Wt 135.2 lb

## 2017-10-15 DIAGNOSIS — M25559 Pain in unspecified hip: Secondary | ICD-10-CM | POA: Diagnosis not present

## 2017-10-15 DIAGNOSIS — R32 Unspecified urinary incontinence: Secondary | ICD-10-CM

## 2017-10-15 DIAGNOSIS — G8929 Other chronic pain: Secondary | ICD-10-CM

## 2017-10-15 DIAGNOSIS — M545 Low back pain, unspecified: Secondary | ICD-10-CM

## 2017-10-15 DIAGNOSIS — I10 Essential (primary) hypertension: Secondary | ICD-10-CM

## 2017-10-15 DIAGNOSIS — R262 Difficulty in walking, not elsewhere classified: Secondary | ICD-10-CM | POA: Diagnosis not present

## 2017-10-15 LAB — POCT URINALYSIS DIPSTICK
Bilirubin, UA: NEGATIVE
Blood, UA: NEGATIVE
Clarity, UA: NEGATIVE
Glucose, UA: NEGATIVE
Leukocytes, UA: NEGATIVE
Nitrite, UA: NEGATIVE
Protein, UA: NEGATIVE
Spec Grav, UA: <=1.005 — AB (ref 1.010–1.025)
Urobilinogen, UA: 0.2 E.U./dL
pH, UA: 5 (ref 5.0–8.0)

## 2017-10-15 MED ORDER — OXYBUTYNIN CHLORIDE 5 MG PO TABS: 2.5000 mg | ORAL_TABLET | Freq: Three times a day (TID) | ORAL | 0 refills | Status: DC

## 2017-10-15 MED ORDER — LISINOPRIL 20 MG PO TABS: 20.0000 mg | ORAL_TABLET | Freq: Every day | ORAL | 1 refills | Status: DC

## 2017-10-15 NOTE — Progress Notes (Signed)
Subjective:    Patient ID: Leslie Johnston, female    DOB: February 06, 1925, 84 y.o.   MRN: [id number]  Leslie Johnston is a 84 y.o. female presenting on 10/15/2017 for Urinary Incontinence (pt having hesitancy with urination when on the toilet, but as soon as she gets up she urinates on herself. ) and Hypertension (blood pressure been up, recent death in the family )   HPI Urinary Incontinence Patient has had urinary frequency and incontinence after being unable to void when sitting on toilet.  Patient notes no dysuria, but is very poor historian 2/2 dementia.  Family is concerned she may have a UTI.  Hypertension Patient has had increased blood pressure in recent weeks with home readings 150-170 SBP.  At visit about 6 months ago, stopped hctz related to urinary freuquency.  No worsening of leg swelling or other symptoms.  Also notes no significant change in urinary frequency.  Back Pain Pain is ongoing.  Patient is reporting pain an dnearly constantly mentions this as primary concern.  Treatment ongoing with initial PT consult today.  Trigger point injections not able to provide significant relief. Social History   Tobacco Use  . Smoking status: Never Smoker  . Smokeless tobacco: Never Used  Substance Use Topics  . Alcohol use: No    Alcohol/week: 0.0 standard drinks  . Drug use: No    Review of Systems Per HPI unless specifically indicated above     Objective:    BP (!) 165/92   Pulse 97   Temp 98.4 F (36.9 C) (Oral)   Ht 5' 7.5" (1.715 m)   Wt 135 lb 3.2 oz (61.3 kg)   BMI 20.86 kg/m   Wt Readings from Last 3 Encounters:  10/15/17 135 lb 3.2 oz (61.3 kg)  10/07/17 138 lb (62.6 kg)  09/19/17 138 lb (62.6 kg)    Physical Exam  Constitutional: She is oriented to person, place, and time. She appears well-developed and well-nourished. No distress.  HENT:  Head: Normocephalic and atraumatic.  Cardiovascular: Normal rate, regular rhythm, S1 normal, S2 normal, normal  heart sounds and intact distal pulses.  Pulmonary/Chest: Effort normal and breath sounds normal. No respiratory distress.  Abdominal: Soft. Bowel sounds are normal. She exhibits no distension. There is no hepatosplenomegaly. There is no tenderness. No hernia.  Musculoskeletal:  Low Back Inspection: Normal appearance, Kyphosis, symmetrical. Palpation: No tenderness over spinous processes. Bilateral lumbar paraspinal muscles tender.  More hypertonicity on R, pain > on L Limited AROM forward flex / back extension, rotation L/R with discomfort in all motions Strength: Bilateral hip flex/ext 5/5, knee flex/ext 5/5, ankle dorsiflex/plantarflex 5/5 Neurovascular: intact distal sensation to light touch   Neurological: She is alert and oriented to person, place, and time.  Skin: Skin is warm and dry.  Psychiatric: She has a normal mood and affect. Her behavior is normal.  Vitals reviewed.   Results for orders placed or performed during the hospital encounter of 09/07/17  Urinalysis, Complete w Microscopic  Result Value Ref Range   Color, Urine STRAW (A) YELLOW   APPearance CLEAR (A) CLEAR   Specific Gravity, Urine 1.006 1.005 - 1.030   pH 7.0 5.0 - 8.0   Glucose, UA NEGATIVE NEGATIVE mg/dL   Hgb urine dipstick NEGATIVE NEGATIVE   Bilirubin Urine NEGATIVE NEGATIVE   Ketones, ur NEGATIVE NEGATIVE mg/dL   Protein, ur NEGATIVE NEGATIVE mg/dL   Nitrite NEGATIVE NEGATIVE   Leukocytes, UA NEGATIVE NEGATIVE   RBC / HPF 0-5  0 - 5 RBC/hpf   WBC, UA 0-5 0 - 5 WBC/hpf   Bacteria, UA NONE SEEN NONE SEEN   Squamous Epithelial / LPF NONE SEEN 0 - 5      Assessment & Plan:   Problem List Items Addressed This Visit      Cardiovascular and Mediastinum   HTN (hypertension) Worsening.  Patient with rising BP off HCTZ and in increased pain. - INCREASE lisinopril to 20 mg once daily. - Continue work toward healthy eating - follow-up as scheduled   Relevant Medications   lisinopril (PRINIVIL,ZESTRIL)  20 MG tablet    Other Visit Diagnoses    Urinary incontinence in female    -  Primary New urinary incontinence.  Likely anatomic cause with bladder prolapse vs OAB.  No active infection.  Plan: 1. START oxybutynin 5 mg once daily.  Use short acting to help reduce side effects/duration of action if side effects are experienced.  Reviewed possible urinary retention, dizziness. 2. POCT UA 3. FOLLOW-UP 2-4 weeks prn.  Consider urology referral if no improvement.   Relevant Medications   oxybutynin (DITROPAN) 5 MG tablet   Other Relevant Orders   POCT Urinalysis Dipstick (Completed)   Chronic bilateral low back pain without sciatica     Ongoing.  Discussed about 3 days ago next plan with family via phone call.  May continue tramadol, but only 50 mg tid prn.  May take 100 mg with physical therapy only.  Supplement with Tylenol for pain control.  Reviewed non-pharm and OTC options for pain management including lidocaine patch with family today. Follow-up 4-6 weeks if symptoms persist after PT      Meds ordered this encounter  Medications  . oxybutynin (DITROPAN) 5 MG tablet    Sig: Take 0.5 tablets (2.5 mg total) by mouth 3 (three) times daily.    Dispense:  15 tablet    Refill:  0    Order Specific Question:   Supervising Provider    Answer:   Olin Hauser [[id number]]  . lisinopril (PRINIVIL,ZESTRIL) 20 MG tablet    Sig: Take 1 tablet (20 mg total) by mouth daily.    Dispense:  90 tablet    Refill:  1    Order Specific Question:   Supervising Provider    Answer:   Olin Hauser [[id number]]    Follow up plan: Return 2-4 weeks if symptoms worsen or fail to improve.  Cassell Smiles, DNP, AGPCNP-BC Adult Gerontology Primary Care Nurse Practitioner [facility] 10/15/2017, 11:46 AM

## 2017-10-15 NOTE — Patient Instructions (Addendum)
Leslie Johnston,   Thank you for coming in to clinic today.  1. INCREASE lisinopril to 20 mg once daily  2. Urinary frequency and incontinence is either from dementia causes (loss of bladder control) OR overactive bladder - START oxybutynin 2.5 mg once daily in morning (take 1/2 tablet).  We can increase dose to take twice daily if needed as long as no side effects. - Watch for dizziness, urinary retention (void at least every 4-6 hours).  If no urine in > 12 hours, urgent care for short term treatment or ER for longer term treatment.  Please schedule a follow-up appointment with Cassell Smiles, AGNP. Return 2-4 weeks if symptoms worsen or fail to improve.  If you have any other questions or concerns, please feel free to call the clinic or send a message through [place]. You may also schedule an earlier appointment if necessary.  You will receive a survey after today's visit either digitally by e-mail or paper by [name]. Your experiences and feedback matter to [name].  Please respond so we know how we are doing as we provide care for you.   Cassell Smiles, DNP, AGNP-BC Adult Gerontology Nurse Practitioner [facility], [facility]  Urinary Incontinence Urinary incontinence is the involuntary loss of urine from your bladder. What are the causes? There are many causes of urinary incontinence. They include:  Medicines.  Infections.  Prostatic enlargement, leading to overflow of urine from your bladder.  Surgery.  Neurological diseases.  Emotional factors.  What are the signs or symptoms? Urinary Incontinence can be divided into four types: 1. Urge incontinence. Urge incontinence is the involuntary loss of urine before you have the opportunity to go to the bathroom. There is a sudden urge to void but not enough time to reach a bathroom. 2. Stress incontinence. Stress incontinence is the sudden loss of urine with any activity that forces urine to pass. It is commonly  caused by anatomical changes to the pelvis and sphincter areas of your body. 3. Overflow incontinence. Overflow incontinence is the loss of urine from an obstructed opening to your bladder. This results in a backup of urine and a resultant buildup of pressure within the bladder. When the pressure within the bladder exceeds the closing pressure of the sphincter, the urine overflows, which causes incontinence, similar to water overflowing a dam. 4. Total incontinence. Total incontinence is the loss of urine as a result of the inability to store urine within your bladder.  How is this diagnosed? Evaluating the cause of incontinence may require:  A thorough and complete medical and obstetric history.  A complete physical exam.  Laboratory tests such as a urine culture and sensitivities.  When additional tests are indicated, they can include:  An ultrasound exam.  Kidney and bladder X-rays.  Cystoscopy. This is an exam of the bladder using a narrow scope.  Urodynamic testing to test the nerve function to the bladder and sphincter areas.  How is this treated? Treatment for urinary incontinence depends on the cause:  For urge incontinence caused by a bacterial infection, antibiotics will be prescribed. If the urge incontinence is related to medicines you take, your health care provider may have you change the medicine.  For stress incontinence, surgery to re-establish anatomical support to the bladder or sphincter, or both, will often correct the condition.  For overflow incontinence caused by an enlarged prostate, an operation to open the channel through the enlarged prostate will allow the flow of urine out of  the bladder. In women with fibroids, a hysterectomy may be recommended.  For total incontinence, surgery on your urinary sphincter may help. An artificial urinary sphincter (an inflatable cuff placed around the urethra) may be required. In women who have developed a hole-like passage  between their bladder and vagina (vesicovaginal fistula), surgery to close the fistula often is required.  Follow these instructions at home:  Normal daily hygiene and the use of pads or adult diapers that are changed regularly will help prevent odors and skin damage.  Avoid caffeine. It can overstimulate your bladder.  Use the bathroom regularly. Try about every 2-3 hours to go to the bathroom, even if you do not feel the need to do so. Take time to empty your bladder completely. After urinating, wait a minute. Then try to urinate again.  For causes involving nerve dysfunction, keep a log of the medicines you take and a journal of the times you go to the bathroom. Contact a health care provider if:  You experience worsening of pain instead of improvement in pain after your procedure.  Your incontinence becomes worse instead of better. Get help right away if:  You experience fever or shaking chills.  You are unable to pass your urine.  You have redness spreading into your groin or down into your thighs. This information is not intended to replace advice given to you by your health care provider. Make sure you discuss any questions you have with your health care provider. Document Released: 01/26/2004 Document Revised: 07/29/2015 Document Reviewed: 05/27/2012 Elsevier Interactive Patient Education  [employer].

## 2017-10-18 ENCOUNTER — Encounter: Payer: Self-pay | Admitting: Nurse Practitioner

## 2017-10-21 DIAGNOSIS — M25559 Pain in unspecified hip: Secondary | ICD-10-CM | POA: Diagnosis not present

## 2017-10-21 DIAGNOSIS — R262 Difficulty in walking, not elsewhere classified: Secondary | ICD-10-CM | POA: Diagnosis not present

## 2017-10-21 DIAGNOSIS — M545 Low back pain: Secondary | ICD-10-CM | POA: Diagnosis not present

## 2017-10-24 DIAGNOSIS — R262 Difficulty in walking, not elsewhere classified: Secondary | ICD-10-CM | POA: Diagnosis not present

## 2017-10-24 DIAGNOSIS — M545 Low back pain: Secondary | ICD-10-CM | POA: Diagnosis not present

## 2017-10-24 DIAGNOSIS — M25559 Pain in unspecified hip: Secondary | ICD-10-CM | POA: Diagnosis not present

## 2017-10-28 DIAGNOSIS — R262 Difficulty in walking, not elsewhere classified: Secondary | ICD-10-CM | POA: Diagnosis not present

## 2017-10-28 DIAGNOSIS — M25559 Pain in unspecified hip: Secondary | ICD-10-CM | POA: Diagnosis not present

## 2017-10-28 DIAGNOSIS — M545 Low back pain: Secondary | ICD-10-CM | POA: Diagnosis not present

## 2017-10-31 DIAGNOSIS — M25559 Pain in unspecified hip: Secondary | ICD-10-CM | POA: Diagnosis not present

## 2017-10-31 DIAGNOSIS — M545 Low back pain: Secondary | ICD-10-CM | POA: Diagnosis not present

## 2017-10-31 DIAGNOSIS — R262 Difficulty in walking, not elsewhere classified: Secondary | ICD-10-CM | POA: Diagnosis not present

## 2017-11-04 ENCOUNTER — Telehealth: Payer: Self-pay | Admitting: Nurse Practitioner

## 2017-11-04 ENCOUNTER — Ambulatory Visit: Payer: Medicare Other | Admitting: Nurse Practitioner

## 2017-11-04 DIAGNOSIS — M545 Low back pain: Secondary | ICD-10-CM | POA: Diagnosis not present

## 2017-11-04 DIAGNOSIS — R262 Difficulty in walking, not elsewhere classified: Secondary | ICD-10-CM | POA: Diagnosis not present

## 2017-11-04 DIAGNOSIS — M25559 Pain in unspecified hip: Secondary | ICD-10-CM | POA: Diagnosis not present

## 2017-11-04 NOTE — Telephone Encounter (Signed)
Daughter called wanted to know id her mother could be o a inversion table. Daughter call back # is [phone]

## 2017-11-05 NOTE — Telephone Encounter (Signed)
I do not recommend this because of patient's osteoporosis and hypertension.

## 2017-11-05 NOTE — Telephone Encounter (Signed)
I spoke with the patient daughter and notified her of Lauren recommendation. She verbalize understanding and reports that her mother blood pressure is still elevated even after the medication change form previous visit. She couldn't give me exact numbers because her mother lives with her other sister and she is the one checking her blood pressure. I informed her that Lauren previous visit stated if the patient symptoms worsen or not improved she needs to follow back up. She verbal understanding , no questions or concerns.

## 2017-11-07 DIAGNOSIS — M25559 Pain in unspecified hip: Secondary | ICD-10-CM | POA: Diagnosis not present

## 2017-11-07 DIAGNOSIS — M545 Low back pain: Secondary | ICD-10-CM | POA: Diagnosis not present

## 2017-11-07 DIAGNOSIS — R262 Difficulty in walking, not elsewhere classified: Secondary | ICD-10-CM | POA: Diagnosis not present

## 2017-11-10 ENCOUNTER — Encounter: Payer: Self-pay | Admitting: Nurse Practitioner

## 2017-11-11 DIAGNOSIS — M25559 Pain in unspecified hip: Secondary | ICD-10-CM | POA: Diagnosis not present

## 2017-11-11 DIAGNOSIS — R262 Difficulty in walking, not elsewhere classified: Secondary | ICD-10-CM | POA: Diagnosis not present

## 2017-11-11 DIAGNOSIS — M545 Low back pain: Secondary | ICD-10-CM | POA: Diagnosis not present

## 2017-11-14 DIAGNOSIS — R262 Difficulty in walking, not elsewhere classified: Secondary | ICD-10-CM | POA: Diagnosis not present

## 2017-11-14 DIAGNOSIS — M25559 Pain in unspecified hip: Secondary | ICD-10-CM | POA: Diagnosis not present

## 2017-11-14 DIAGNOSIS — M545 Low back pain: Secondary | ICD-10-CM | POA: Diagnosis not present

## 2017-11-18 ENCOUNTER — Ambulatory Visit (INDEPENDENT_AMBULATORY_CARE_PROVIDER_SITE_OTHER): Payer: Medicare Other | Admitting: Nurse Practitioner

## 2017-11-18 ENCOUNTER — Other Ambulatory Visit: Payer: Self-pay | Admitting: Nurse Practitioner

## 2017-11-18 ENCOUNTER — Ambulatory Visit
Admission: RE | Admit: 2017-11-18 | Discharge: 2017-11-18 | Disposition: A | Discharge status: Home / Self Care | Payer: Medicare Other | Source: Ambulatory Visit | Attending: Nurse Practitioner | Admitting: Nurse Practitioner

## 2017-11-18 ENCOUNTER — Encounter: Payer: Self-pay | Admitting: Nurse Practitioner

## 2017-11-18 VITALS — BP 152/71 | Temp 98.0°F | Ht 67.5 in | Wt 132.0 lb

## 2017-11-18 DIAGNOSIS — K8020 Calculus of gallbladder without cholecystitis without obstruction: Secondary | ICD-10-CM | POA: Insufficient documentation

## 2017-11-18 DIAGNOSIS — M25552 Pain in left hip: Secondary | ICD-10-CM | POA: Insufficient documentation

## 2017-11-18 DIAGNOSIS — M4856XA Collapsed vertebra, not elsewhere classified, lumbar region, initial encounter for fracture: Secondary | ICD-10-CM | POA: Insufficient documentation

## 2017-11-18 DIAGNOSIS — M4854XA Collapsed vertebra, not elsewhere classified, thoracic region, initial encounter for fracture: Secondary | ICD-10-CM | POA: Insufficient documentation

## 2017-11-18 DIAGNOSIS — M545 Low back pain, unspecified: Secondary | ICD-10-CM

## 2017-11-18 DIAGNOSIS — I700 Atherosclerosis of aorta: Secondary | ICD-10-CM | POA: Insufficient documentation

## 2017-11-18 DIAGNOSIS — I10 Essential (primary) hypertension: Secondary | ICD-10-CM | POA: Diagnosis not present

## 2017-11-18 DIAGNOSIS — F341 Dysthymic disorder: Secondary | ICD-10-CM

## 2017-11-18 DIAGNOSIS — G8929 Other chronic pain: Secondary | ICD-10-CM

## 2017-11-18 DIAGNOSIS — R32 Unspecified urinary incontinence: Secondary | ICD-10-CM

## 2017-11-18 DIAGNOSIS — M810 Age-related osteoporosis without current pathological fracture: Secondary | ICD-10-CM | POA: Insufficient documentation

## 2017-11-18 DIAGNOSIS — F0390 Unspecified dementia without behavioral disturbance: Secondary | ICD-10-CM

## 2017-11-18 MED ORDER — DICLOFENAC SODIUM 50 MG PO TBEC: 50.0000 mg | DELAYED_RELEASE_TABLET | Freq: Two times a day (BID) | ORAL | 1 refills | Status: DC | PRN

## 2017-11-18 MED ORDER — OXYBUTYNIN CHLORIDE 5 MG PO TABS: 2.5000 mg | ORAL_TABLET | Freq: Three times a day (TID) | ORAL | 2 refills | Status: DC

## 2017-11-18 MED ORDER — MIRTAZAPINE 7.5 MG PO TABS: 7.5000 mg | ORAL_TABLET | Freq: Every day | ORAL | 5 refills | Status: DC

## 2017-11-18 NOTE — Patient Instructions (Addendum)
Leslie Johnston,   Thank you for coming in to clinic today.  1. STOP advil - START diclofenac 50 mg twice daily as needed for moderate pain.  2. STOP Aricept - it is not creating any benefit right now and is worsening sleep.  3. Continue lisionpril 20 mg once daily - START amlodipine 5 mg once daily in am for blood pressure.  4. Xrays today - we can consider pain management if having a problem that may benefit from nerve blocks/steroid injections.  5. For mild depression and to help pain associated with depression, start mirtazapine 7.5 mg once daily at bedtime.  Please schedule a follow-up appointment with Cassell Smiles, AGNP. Return in about 3 months (around 02/18/2018) for dementia.  If you have any other questions or concerns, please feel free to call the clinic or send a message through [place]. You may also schedule an earlier appointment if necessary.  You will receive a survey after today's visit either digitally by e-mail or paper by [name]. Your experiences and feedback matter to [name].  Please respond so we know how we are doing as we provide care for you.   Cassell Smiles, DNP, AGNP-BC Adult Gerontology Nurse Practitioner [place]

## 2017-11-18 NOTE — Progress Notes (Signed)
Subjective:    Patient ID: Leslie Johnston, female    DOB: 06-05-1925, 84 y.o.   MRN: [id number]  Leslie Johnston is a 84 y.o. female presenting on 11/18/2017 for Back Pain (lower back pain that worsen over time. PT state PT and medication is not imptoving the pain. )   HPI Back Pain Patient is currently taking acetaminophen 500mg , ibuprofen 200 mg q 4 hours. Helps some with Advil, but no improvement with tylenol.  Tramadol did not help with pain. - Physical therapy not improving pain. - Family concern for possible malignancy, fracture.  Is requesting imaging today.  Patient has known history of osteoporosis - Difficult to get out of bed, pulling pants up, putting shirts on, walking with pain and now is fearful of falling.  Hypertension - Patient's home BP readings 155-180/80-90 - higher later in day with higher pain levels - decreased diuretic and had stable BP in past until increase in pain level occurred late September.  Depression/Insomnia Patient and family also note increasing irritability, down moods.  Patient now has significantly more difficulty sleeping.  Appetite is also lower.  May be associated with pain or with dementia.  Social History   Tobacco Use  . Smoking status: Never Smoker  . Smokeless tobacco: Never Used  Substance Use Topics  . Alcohol use: No    Alcohol/week: 0.0 standard drinks  . Drug use: No    Review of Systems Per HPI unless specifically indicated above     Objective:    BP (!) 152/71 (BP Location: Right Arm, Patient Position: Sitting, Cuff Size: Normal)   Temp 98 F (36.7 C) (Oral)   Ht 5' 7.5" (1.715 m)   Wt 132 lb (59.9 kg)   BMI 20.37 kg/m   Wt Readings from Last 3 Encounters:  11/18/17 132 lb (59.9 kg)  10/15/17 135 lb 3.2 oz (61.3 kg)  10/07/17 138 lb (62.6 kg)    Physical Exam  Constitutional: She is oriented to person, place, and time. She appears well-developed and well-nourished. No distress.  HENT:  Head:  Normocephalic and atraumatic.  Cardiovascular: Normal rate, regular rhythm, S1 normal, S2 normal, normal heart sounds and intact distal pulses.  Pulmonary/Chest: Effort normal and breath sounds normal. No respiratory distress.  Musculoskeletal:  Low Back Inspection: Normal appearance, Kyphotic spinal deformity, symmetrical. Palpation: No tenderness over spinous processes. Bilateral lumbar paraspinal muscles tender and with mild R sided hypertonicity. ROM: Reduced AROM forward flex / back extension, rotation L/R with significant discomfort.  Patient in pain at rest while seated and standing. Special Testing: Seated SLR negative for radicular pain bilaterally.  Strength: Bilateral hip flex/ext 5/5, knee flex/ext 5/5, ankle dorsiflex/plantarflex 5/5 Neurovascular: intact distal sensation to light touch  Neurological: She is alert and oriented to person, place, and time.  Skin: Skin is warm and dry.  Psychiatric: She has a normal mood and affect. Her behavior is normal.  Vitals reviewed.   Results for orders placed or performed in visit on 10/15/17  POCT Urinalysis Dipstick  Result Value Ref Range   Color, UA yellow    Clarity, UA negative    Glucose, UA Negative Negative   Bilirubin, UA negative    Ketones, UA small    Spec Grav, UA <=1.005 (A) 1.010 - 1.025   Blood, UA negative    pH, UA 5.0 5.0 - 8.0   Protein, UA Negative Negative   Urobilinogen, UA 0.2 0.2 or 1.0 E.U./dL   Nitrite, UA negative  Leukocytes, UA Negative Negative   Appearance     Odor        Assessment & Plan:   Problem List Items Addressed This Visit      Cardiovascular and Mediastinum   HTN (hypertension)    Other Visit Diagnoses    Left hip pain    -  Primary   Relevant Medications   diclofenac (VOLTAREN) 50 MG EC tablet   Other Relevant Orders   DG Lumbar Spine Complete (Completed)   Urinary incontinence in female       Relevant Medications   oxybutynin (DITROPAN) 5 MG tablet   Chronic  bilateral low back pain without sciatica       Relevant Medications   ibuprofen (ADVIL,MOTRIN) 200 MG tablet   diclofenac (VOLTAREN) 50 MG EC tablet   Other Relevant Orders   DG Lumbar Spine Complete (Completed)   AMB referral to orthopedics   Dementia without behavioral disturbance, unspecified dementia type (HCC)       Relevant Medications   mirtazapine (REMERON) 7.5 MG tablet   Dysthymia       Relevant Medications   mirtazapine (REMERON) 7.5 MG tablet      # Hip and back pain: - START diclofenac 50 mg bid x 14 days then as needed. - Xrays today - confirm spinal compression fractures. - Referral orthopedics urgently.  No concern for spinal instability given duration of pain and lack of symptoms.  # Urinary frequency Patient continues having urinary frequency, but relief with oxybutynin.  Continue 2.5 mg tid. - Consider future urology referral if needed.  # Hypertension Uncontrolled.  Likely multifactorial including increased NSAID use and increased pain level. - Continue lisinopril - START amlodipine 5 mg once daily.  # Depression/Dementia Mild depression present.  Patient also with insomnia and decreased appetite.  Some irritability may be related to dementia. - START mirtazapine 7.5 mg once daily. Reviewed may worsen delirium despite using it to help with delirium, depression, insomnia. - Stop aricept - is not providing patient any benefit and seems to be worsening sleep - Follow-up 3 months.  Meds ordered this encounter  Medications  . oxybutynin (DITROPAN) 5 MG tablet    Sig: Take 0.5 tablets (2.5 mg total) by mouth 3 (three) times daily.    Dispense:  45 tablet    Refill:  2    Order Specific Question:   Supervising Provider    Answer:   Olin Hauser [[id number]]  . diclofenac (VOLTAREN) 50 MG EC tablet    Sig: Take 1 tablet (50 mg total) by mouth 2 (two) times daily as needed for moderate pain.    Dispense:  60 tablet    Refill:  1    Order Specific  Question:   Supervising Provider    Answer:   Olin Hauser [[id number]]  . mirtazapine (REMERON) 7.5 MG tablet    Sig: Take 1 tablet (7.5 mg total) by mouth at bedtime.    Dispense:  30 tablet    Refill:  5    Order Specific Question:   Supervising Provider    Answer:   Olin Hauser [[id number]]    Follow up plan: Return in about 3 months (around 02/18/2018) for dementia.  Cassell Smiles, DNP, AGPCNP-BC Adult Gerontology Primary Care Nurse Practitioner [facility] 11/18/2017, 11:24 AM

## 2017-11-20 DIAGNOSIS — M4854XA Collapsed vertebra, not elsewhere classified, thoracic region, initial encounter for fracture: Secondary | ICD-10-CM | POA: Diagnosis not present

## 2017-11-20 DIAGNOSIS — M4856XA Collapsed vertebra, not elsewhere classified, lumbar region, initial encounter for fracture: Secondary | ICD-10-CM | POA: Diagnosis not present

## 2017-11-21 ENCOUNTER — Encounter: Payer: Self-pay | Admitting: Nurse Practitioner

## 2017-11-22 ENCOUNTER — Encounter: Payer: Self-pay | Admitting: Nurse Practitioner

## 2017-11-25 DIAGNOSIS — S22000A Wedge compression fracture of unspecified thoracic vertebra, initial encounter for closed fracture: Secondary | ICD-10-CM | POA: Diagnosis not present

## 2017-11-25 DIAGNOSIS — M4856XA Collapsed vertebra, not elsewhere classified, lumbar region, initial encounter for fracture: Secondary | ICD-10-CM | POA: Diagnosis not present

## 2017-11-25 DIAGNOSIS — S32010A Wedge compression fracture of first lumbar vertebra, initial encounter for closed fracture: Secondary | ICD-10-CM | POA: Diagnosis not present

## 2017-11-26 ENCOUNTER — Other Ambulatory Visit: Payer: Self-pay

## 2017-11-26 ENCOUNTER — Encounter
Admission: RE | Disposition: A | Discharge status: Home / Self Care | Payer: Self-pay | Source: Ambulatory Visit | Attending: Orthopedic Surgery

## 2017-11-26 ENCOUNTER — Ambulatory Visit: Payer: Medicare Other | Admitting: Anesthesiology

## 2017-11-26 ENCOUNTER — Encounter: Payer: Self-pay | Admitting: *Deleted

## 2017-11-26 ENCOUNTER — Ambulatory Visit
Admission: RE | Admit: 2017-11-26 | Discharge: 2017-11-26 | Disposition: A | Discharge status: Home / Self Care | Payer: Medicare Other | Source: Ambulatory Visit | Attending: Orthopedic Surgery | Admitting: Orthopedic Surgery

## 2017-11-26 ENCOUNTER — Ambulatory Visit: Payer: Medicare Other

## 2017-11-26 DIAGNOSIS — M4856XA Collapsed vertebra, not elsewhere classified, lumbar region, initial encounter for fracture: Secondary | ICD-10-CM | POA: Diagnosis not present

## 2017-11-26 DIAGNOSIS — S22080A Wedge compression fracture of T11-T12 vertebra, initial encounter for closed fracture: Secondary | ICD-10-CM | POA: Diagnosis not present

## 2017-11-26 DIAGNOSIS — M4854XA Collapsed vertebra, not elsewhere classified, thoracic region, initial encounter for fracture: Secondary | ICD-10-CM | POA: Insufficient documentation

## 2017-11-26 DIAGNOSIS — I10 Essential (primary) hypertension: Secondary | ICD-10-CM | POA: Insufficient documentation

## 2017-11-26 DIAGNOSIS — F329 Major depressive disorder, single episode, unspecified: Secondary | ICD-10-CM | POA: Insufficient documentation

## 2017-11-26 DIAGNOSIS — F0390 Unspecified dementia without behavioral disturbance: Secondary | ICD-10-CM | POA: Insufficient documentation

## 2017-11-26 DIAGNOSIS — Z79899 Other long term (current) drug therapy: Secondary | ICD-10-CM | POA: Diagnosis not present

## 2017-11-26 DIAGNOSIS — Z981 Arthrodesis status: Secondary | ICD-10-CM | POA: Diagnosis not present

## 2017-11-26 DIAGNOSIS — M4853XD Collapsed vertebra, not elsewhere classified, cervicothoracic region, subsequent encounter for fracture with routine healing: Secondary | ICD-10-CM | POA: Diagnosis not present

## 2017-11-26 DIAGNOSIS — S32010A Wedge compression fracture of first lumbar vertebra, initial encounter for closed fracture: Secondary | ICD-10-CM | POA: Diagnosis not present

## 2017-11-26 DIAGNOSIS — Z419 Encounter for procedure for purposes other than remedying health state, unspecified: Secondary | ICD-10-CM

## 2017-11-26 HISTORY — PX: KYPHOPLASTY: SHX5884

## 2017-11-26 SURGERY — KYPHOPLASTY
Anesthesia: General

## 2017-11-26 MED ORDER — FENTANYL CITRATE (PF) 100 MCG/2ML IJ SOLN
INTRAMUSCULAR | Status: AC
  Administered 2017-11-26: 25 ug via INTRAVENOUS
  Filled 2017-11-26: qty 2

## 2017-11-26 MED ORDER — HYDROCODONE-ACETAMINOPHEN 5-325 MG PO TABS
ORAL_TABLET | ORAL | Status: AC
  Administered 2017-11-26: 1 via ORAL
  Filled 2017-11-26: qty 1

## 2017-11-26 MED ORDER — PROPOFOL 10 MG/ML IV BOLUS
INTRAVENOUS | Status: AC
  Filled 2017-11-26: qty 20

## 2017-11-26 MED ORDER — IOPAMIDOL (ISOVUE-M 200) INJECTION 41%
INTRAMUSCULAR | Status: DC | PRN
  Administered 2017-11-26: 40 mL

## 2017-11-26 MED ORDER — BUPIVACAINE-EPINEPHRINE (PF) 0.25% -1:200000 IJ SOLN
INTRAMUSCULAR | Status: DC | PRN
  Administered 2017-11-26: 20 mL

## 2017-11-26 MED ORDER — CLINDAMYCIN PHOSPHATE 600 MG/50ML IV SOLN
INTRAVENOUS | Status: AC
  Filled 2017-11-26: qty 50

## 2017-11-26 MED ORDER — HYDROCODONE-ACETAMINOPHEN 5-325 MG PO TABS
1.0000 | ORAL_TABLET | ORAL | Status: DC | PRN
  Administered 2017-11-26: 1 via ORAL

## 2017-11-26 MED ORDER — FENTANYL CITRATE (PF) 100 MCG/2ML IJ SOLN
INTRAMUSCULAR | Status: AC
  Filled 2017-11-26: qty 2

## 2017-11-26 MED ORDER — PROPOFOL 500 MG/50ML IV EMUL
INTRAVENOUS | Status: DC | PRN
  Administered 2017-11-26: 25 ug/kg/min via INTRAVENOUS

## 2017-11-26 MED ORDER — CLINDAMYCIN PHOSPHATE 600 MG/50ML IV SOLN
600.0000 mg | Freq: Once | INTRAVENOUS | Status: AC
  Administered 2017-11-26: 600 mg via INTRAVENOUS

## 2017-11-26 MED ORDER — FENTANYL CITRATE (PF) 100 MCG/2ML IJ SOLN
INTRAMUSCULAR | Status: DC | PRN
  Administered 2017-11-26 (×3): 25 ug via INTRAVENOUS

## 2017-11-26 MED ORDER — LACTATED RINGERS IV SOLN: INTRAVENOUS | Status: DC

## 2017-11-26 MED ORDER — LIDOCAINE HCL (PF) 2 % IJ SOLN
INTRAMUSCULAR | Status: AC
  Filled 2017-11-26: qty 10

## 2017-11-26 MED ORDER — MEPERIDINE HCL 25 MG/ML IJ SOLN: 12.5000 mg | Freq: Once | INTRAMUSCULAR | Status: DC

## 2017-11-26 MED ORDER — PROPOFOL 10 MG/ML IV BOLUS
INTRAVENOUS | Status: DC | PRN
  Administered 2017-11-26: 40 mg via INTRAVENOUS
  Administered 2017-11-26: 10 mg via INTRAVENOUS

## 2017-11-26 MED ORDER — ONDANSETRON HCL 4 MG/2ML IJ SOLN: 4.0000 mg | Freq: Once | INTRAMUSCULAR | Status: DC | PRN

## 2017-11-26 MED ORDER — FENTANYL CITRATE (PF) 100 MCG/2ML IJ SOLN
25.0000 ug | INTRAMUSCULAR | Status: DC | PRN
  Administered 2017-11-26 (×5): 25 ug via INTRAVENOUS

## 2017-11-26 MED ORDER — MEPERIDINE HCL 50 MG/ML IJ SOLN
INTRAMUSCULAR | Status: AC
  Administered 2017-11-26: 12.5 mg via INTRAVENOUS
  Filled 2017-11-26: qty 1

## 2017-11-26 MED ORDER — LIDOCAINE HCL (CARDIAC) PF 100 MG/5ML IV SOSY
PREFILLED_SYRINGE | INTRAVENOUS | Status: DC | PRN
  Administered 2017-11-26: 50 mg via INTRAVENOUS

## 2017-11-26 MED ORDER — LIDOCAINE HCL 1 % IJ SOLN
INTRAMUSCULAR | Status: DC | PRN
  Administered 2017-11-26: 30 mL

## 2017-11-26 SURGICAL SUPPLY — 16 items
CEMENT KYPHON CX01A KIT/MIXER (Cement) ×2 IMPLANT
COVER WAND RF STERILE (DRAPES) ×2 IMPLANT
DERMABOND ADVANCED (GAUZE/BANDAGES/DRESSINGS) ×1
DERMABOND ADVANCED .7 DNX12 (GAUZE/BANDAGES/DRESSINGS) ×1 IMPLANT
DEVICE BIOPSY BONE KYPH (INSTRUMENTS) ×1 IMPLANT
DRAPE C-ARM XRAY 36X54 (DRAPES) ×2 IMPLANT
DURAPREP 26ML APPLICATOR (WOUND CARE) ×2 IMPLANT
GLOVE SURG SYN 9.0  PF PI (GLOVE) ×1
GLOVE SURG SYN 9.0 PF PI (GLOVE) ×1 IMPLANT
GOWN SRG 2XL LVL 4 RGLN SLV (GOWNS) ×1 IMPLANT
GOWN STRL NON-REIN 2XL LVL4 (GOWNS) ×1
GOWN STRL REUS W/ TWL LRG LVL3 (GOWN DISPOSABLE) ×1 IMPLANT
GOWN STRL REUS W/TWL LRG LVL3 (GOWN DISPOSABLE) ×1
PACK KYPHOPLASTY (MISCELLANEOUS) ×2 IMPLANT
STRAP SAFETY 5IN WIDE (MISCELLANEOUS) ×2 IMPLANT
TRAY KYPHOPAK 15/2 EXPRESS (KITS) ×1 IMPLANT

## 2017-11-26 NOTE — Anesthesia Preprocedure Evaluation (Signed)
Anesthesia Evaluation  Patient identified by MRN, date of birth, ID band Patient awake    Reviewed: Allergy & Precautions, NPO status , Patient's Chart, lab work & pertinent test results  Airway Mallampati: III       Dental  (+) Upper Dentures, Lower Dentures   Pulmonary neg pulmonary ROS,    Pulmonary exam normal        Cardiovascular hypertension, Normal cardiovascular exam     Neuro/Psych negative neurological ROS  negative psych ROS   GI/Hepatic Neg liver ROS,   Endo/Other  negative endocrine ROS  Renal/GU negative Renal ROS  negative genitourinary   Musculoskeletal   Abdominal Normal abdominal exam  (+)   Peds negative pediatric ROS (+)  Hematology negative hematology ROS (+)   Anesthesia Other Findings Past Medical History: No date: Allergy No date: Hypertension  Reproductive/Obstetrics                             Anesthesia Physical Anesthesia Plan  ASA: III  Anesthesia Plan: General   Post-op Pain Management:    Induction: Intravenous  PONV Risk Score and Plan: Propofol infusion and TIVA  Airway Management Planned: Nasal Cannula  Additional Equipment:   Intra-op Plan:   Post-operative Plan:   Informed Consent: I have reviewed the patients History and Physical, chart, labs and discussed the procedure including the risks, benefits and alternatives for the proposed anesthesia with the patient or authorized representative who has indicated his/her understanding and acceptance.   Dental advisory given  Plan Discussed with: CRNA and Surgeon  Anesthesia Plan Comments:         Anesthesia Quick Evaluation

## 2017-11-26 NOTE — H&P (Signed)
Reviewed paper H+P, will be scanned into chart. No changes noted.  

## 2017-11-26 NOTE — Anesthesia Post-op Follow-up Note (Signed)
Anesthesia QCDR form completed.        

## 2017-11-26 NOTE — Op Note (Signed)
Date 11/26/17  Time 11:05   PATIENT: Leslie Johnston   PRE-OPERATIVE DIAGNOSIS:  closed wedge compression fracture of T12 and L1   POST-OPERATIVE DIAGNOSIS:  closed wedge compression fracture of T12 and L1   PROCEDURE:  Procedure(s): KYPHOPLASTY T12 and L1  SURGEON: Laurene Footman, MD   ASSISTANTS: None   ANESTHESIA:   local and MAC   EBL:  No intake/output data recorded.   BLOOD ADMINISTERED:none   DRAINS: none    LOCAL MEDICATIONS USED:  MARCAINE    and XYLOCAINE    SPECIMEN:   At T12 to body biopsy   DISPOSITION OF SPECIMEN:  Pathology   COUNTS:  YES   TOURNIQUET:  * No tourniquets in log *   IMPLANTS: Bone cement   DICTATION: .Dragon Dictation  patient was brought to the operating room and after adequate anesthesia was obtained the patient was placed prone.  C arm was brought in in good visualization of the affected level obtained on both AP and lateral projections.  After patient identification and timeout procedures were completed, local anesthetic was infiltrated with 10 cc 1% Xylocaine infiltrated subcutaneously.  This is done the area on the right side of the planned approach.  The back was then prepped and draped in the usual sterile manner and repeat timeout procedure carried out.  A spinal needle was brought down to the pedicle on the right side of  T12 and then L1 and a 50-50 mix of 1% Xylocaine half percent Sensorcaine with epinephrine total of 20 cc injected.  After allowing this to set a small incision was made and the trocar was advanced into the vertebral body in an extrapedicular fashion.  Biopsy was obtained at T12 but not at L1 Drilling was carried out balloon inserted with inflation to  2 cc at T12 3 cc at L1.  When the cement was appropriate consistency 3 cc cc were injected into the vertebral body without extravasation at T12 and 4 cc at 01, good fill superior to inferior endplates and from right to left sides along the inferior endplate.  After the cement  had set the trochar was removed and permanent C-arm views obtained.  The wound was closed with Dermabond followed by Band-Aid   PLAN OF CARE: Discharge to home after PACU   PATIENT DISPOSITION:  PACU - hemodynamically stable.

## 2017-11-26 NOTE — Transfer of Care (Signed)
Immediate Anesthesia Transfer of Care Note  Patient: Leslie Johnston  Procedure(s) Performed: Ignacia Bayley (N/A )  Patient Location: PACU  Anesthesia Type:General  Level of Consciousness: awake, alert  and responds to stimulation  Airway & Oxygen Therapy: Patient Spontanous Breathing and Patient connected to nasal cannula oxygen  Post-op Assessment: Report given to RN and Post -op Vital signs reviewed and stable  Post vital signs: Reviewed and stable  Last Vitals:  Vitals Value Taken Time  BP 184/87 11/26/2017 11:09 AM  Temp    Pulse 95 11/26/2017 11:09 AM  Resp 22 11/26/2017 11:09 AM  SpO2 97 % 11/26/2017 11:09 AM    Last Pain:  Vitals:   11/26/17 0813  TempSrc: Tympanic  PainSc: 8          Complications: No apparent anesthesia complications

## 2017-11-26 NOTE — Discharge Instructions (Addendum)
Take it easy today and tomorrow and resume more normal activities on Thursday.  Remove Band-Aids on Thursday then okay to shower.  AMBULATORY SURGERY  DISCHARGE INSTRUCTIONS   1) The drugs that you were given will stay in your system until tomorrow so for the next 24 hours you should not:  A) Drive an automobile B) Make any legal decisions C) Drink any alcoholic beverage   2) You may resume regular meals tomorrow.  Today it is better to start with liquids and gradually work up to solid foods.  You may eat anything you prefer, but it is better to start with liquids, then soup and crackers, and gradually work up to solid foods.   3) Please notify your doctor immediately if you have any unusual bleeding, trouble breathing, redness and pain at the surgery site, drainage, fever, or pain not relieved by medication.    4) Additional Instructions:        Please contact your physician with any problems or Same Day Surgery at [phone], Monday through Friday 6 am to 4 pm, or [place] at [facility] number at [phone].

## 2017-11-27 ENCOUNTER — Encounter: Payer: Self-pay | Admitting: Orthopedic Surgery

## 2017-11-27 NOTE — Anesthesia Postprocedure Evaluation (Signed)
Anesthesia Post Note  Patient: Leslie Johnston  Procedure(s) Performed: KYPHOPLASTY-T12,L1 (N/A )  Patient location during evaluation: PACU Anesthesia Type: General Level of consciousness: awake and alert and oriented Pain management: pain level controlled Vital Signs Assessment: post-procedure vital signs reviewed and stable Respiratory status: spontaneous breathing Cardiovascular status: blood pressure returned to baseline Anesthetic complications: no     Last Vitals:  Vitals:   11/26/17 1248 11/26/17 1341  BP: (!) 177/79 (!) 180/79  Pulse: 85 91  Resp:  16  Temp: 37.1 C   SpO2: 93% 93%    Last Pain:  Vitals:   11/26/17 1341  TempSrc:   PainSc: 3                  [name]

## 2017-11-29 LAB — SURGICAL PATHOLOGY

## 2017-12-09 ENCOUNTER — Ambulatory Visit
Admission: RE | Admit: 2017-12-09 | Discharge: 2017-12-09 | Disposition: A | Discharge status: Home / Self Care | Payer: Medicare Other | Source: Ambulatory Visit | Attending: Orthopedic Surgery | Admitting: Orthopedic Surgery

## 2017-12-09 ENCOUNTER — Other Ambulatory Visit: Payer: Self-pay | Admitting: Orthopedic Surgery

## 2017-12-09 DIAGNOSIS — M25552 Pain in left hip: Secondary | ICD-10-CM | POA: Insufficient documentation

## 2017-12-09 DIAGNOSIS — S76312A Strain of muscle, fascia and tendon of the posterior muscle group at thigh level, left thigh, initial encounter: Secondary | ICD-10-CM | POA: Diagnosis not present

## 2017-12-09 DIAGNOSIS — Z9889 Other specified postprocedural states: Secondary | ICD-10-CM | POA: Diagnosis not present

## 2017-12-09 DIAGNOSIS — S22000D Wedge compression fracture of unspecified thoracic vertebra, subsequent encounter for fracture with routine healing: Secondary | ICD-10-CM | POA: Diagnosis not present

## 2017-12-14 DIAGNOSIS — F0390 Unspecified dementia without behavioral disturbance: Secondary | ICD-10-CM | POA: Diagnosis not present

## 2017-12-14 DIAGNOSIS — S22089D Unspecified fracture of T11-T12 vertebra, subsequent encounter for fracture with routine healing: Secondary | ICD-10-CM | POA: Diagnosis not present

## 2017-12-14 DIAGNOSIS — S76311D Strain of muscle, fascia and tendon of the posterior muscle group at thigh level, right thigh, subsequent encounter: Secondary | ICD-10-CM | POA: Diagnosis not present

## 2017-12-14 DIAGNOSIS — I10 Essential (primary) hypertension: Secondary | ICD-10-CM | POA: Diagnosis not present

## 2017-12-14 DIAGNOSIS — S32019D Unspecified fracture of first lumbar vertebra, subsequent encounter for fracture with routine healing: Secondary | ICD-10-CM | POA: Diagnosis not present

## 2017-12-14 DIAGNOSIS — F329 Major depressive disorder, single episode, unspecified: Secondary | ICD-10-CM | POA: Diagnosis not present

## 2017-12-14 DIAGNOSIS — Z7982 Long term (current) use of aspirin: Secondary | ICD-10-CM | POA: Diagnosis not present

## 2017-12-14 DIAGNOSIS — Z9181 History of falling: Secondary | ICD-10-CM | POA: Diagnosis not present

## 2017-12-14 DIAGNOSIS — R32 Unspecified urinary incontinence: Secondary | ICD-10-CM | POA: Diagnosis not present

## 2017-12-14 DIAGNOSIS — S76312D Strain of muscle, fascia and tendon of the posterior muscle group at thigh level, left thigh, subsequent encounter: Secondary | ICD-10-CM | POA: Diagnosis not present

## 2017-12-17 DIAGNOSIS — S76312D Strain of muscle, fascia and tendon of the posterior muscle group at thigh level, left thigh, subsequent encounter: Secondary | ICD-10-CM | POA: Diagnosis not present

## 2017-12-17 DIAGNOSIS — S76311D Strain of muscle, fascia and tendon of the posterior muscle group at thigh level, right thigh, subsequent encounter: Secondary | ICD-10-CM | POA: Diagnosis not present

## 2017-12-17 DIAGNOSIS — S22089D Unspecified fracture of T11-T12 vertebra, subsequent encounter for fracture with routine healing: Secondary | ICD-10-CM | POA: Diagnosis not present

## 2017-12-17 DIAGNOSIS — F0390 Unspecified dementia without behavioral disturbance: Secondary | ICD-10-CM | POA: Diagnosis not present

## 2017-12-17 DIAGNOSIS — S32019D Unspecified fracture of first lumbar vertebra, subsequent encounter for fracture with routine healing: Secondary | ICD-10-CM | POA: Diagnosis not present

## 2017-12-17 DIAGNOSIS — I10 Essential (primary) hypertension: Secondary | ICD-10-CM | POA: Diagnosis not present

## 2017-12-19 DIAGNOSIS — S32019D Unspecified fracture of first lumbar vertebra, subsequent encounter for fracture with routine healing: Secondary | ICD-10-CM | POA: Diagnosis not present

## 2017-12-19 DIAGNOSIS — S22089D Unspecified fracture of T11-T12 vertebra, subsequent encounter for fracture with routine healing: Secondary | ICD-10-CM | POA: Diagnosis not present

## 2017-12-19 DIAGNOSIS — F0390 Unspecified dementia without behavioral disturbance: Secondary | ICD-10-CM | POA: Diagnosis not present

## 2017-12-19 DIAGNOSIS — S76311D Strain of muscle, fascia and tendon of the posterior muscle group at thigh level, right thigh, subsequent encounter: Secondary | ICD-10-CM | POA: Diagnosis not present

## 2017-12-19 DIAGNOSIS — I10 Essential (primary) hypertension: Secondary | ICD-10-CM | POA: Diagnosis not present

## 2017-12-19 DIAGNOSIS — S76312D Strain of muscle, fascia and tendon of the posterior muscle group at thigh level, left thigh, subsequent encounter: Secondary | ICD-10-CM | POA: Diagnosis not present

## 2017-12-24 DIAGNOSIS — S76312D Strain of muscle, fascia and tendon of the posterior muscle group at thigh level, left thigh, subsequent encounter: Secondary | ICD-10-CM | POA: Diagnosis not present

## 2017-12-24 DIAGNOSIS — I10 Essential (primary) hypertension: Secondary | ICD-10-CM | POA: Diagnosis not present

## 2017-12-24 DIAGNOSIS — S22089D Unspecified fracture of T11-T12 vertebra, subsequent encounter for fracture with routine healing: Secondary | ICD-10-CM | POA: Diagnosis not present

## 2017-12-24 DIAGNOSIS — F0390 Unspecified dementia without behavioral disturbance: Secondary | ICD-10-CM | POA: Diagnosis not present

## 2017-12-24 DIAGNOSIS — S32019D Unspecified fracture of first lumbar vertebra, subsequent encounter for fracture with routine healing: Secondary | ICD-10-CM | POA: Diagnosis not present

## 2017-12-24 DIAGNOSIS — S76311D Strain of muscle, fascia and tendon of the posterior muscle group at thigh level, right thigh, subsequent encounter: Secondary | ICD-10-CM | POA: Diagnosis not present

## 2017-12-26 DIAGNOSIS — S32019D Unspecified fracture of first lumbar vertebra, subsequent encounter for fracture with routine healing: Secondary | ICD-10-CM | POA: Diagnosis not present

## 2017-12-26 DIAGNOSIS — I10 Essential (primary) hypertension: Secondary | ICD-10-CM | POA: Diagnosis not present

## 2017-12-26 DIAGNOSIS — S76312D Strain of muscle, fascia and tendon of the posterior muscle group at thigh level, left thigh, subsequent encounter: Secondary | ICD-10-CM | POA: Diagnosis not present

## 2017-12-26 DIAGNOSIS — S76311D Strain of muscle, fascia and tendon of the posterior muscle group at thigh level, right thigh, subsequent encounter: Secondary | ICD-10-CM | POA: Diagnosis not present

## 2017-12-26 DIAGNOSIS — S22089D Unspecified fracture of T11-T12 vertebra, subsequent encounter for fracture with routine healing: Secondary | ICD-10-CM | POA: Diagnosis not present

## 2017-12-26 DIAGNOSIS — F0390 Unspecified dementia without behavioral disturbance: Secondary | ICD-10-CM | POA: Diagnosis not present

## 2017-12-31 DIAGNOSIS — S76312D Strain of muscle, fascia and tendon of the posterior muscle group at thigh level, left thigh, subsequent encounter: Secondary | ICD-10-CM | POA: Diagnosis not present

## 2017-12-31 DIAGNOSIS — S76311D Strain of muscle, fascia and tendon of the posterior muscle group at thigh level, right thigh, subsequent encounter: Secondary | ICD-10-CM | POA: Diagnosis not present

## 2017-12-31 DIAGNOSIS — I10 Essential (primary) hypertension: Secondary | ICD-10-CM | POA: Diagnosis not present

## 2017-12-31 DIAGNOSIS — S32019D Unspecified fracture of first lumbar vertebra, subsequent encounter for fracture with routine healing: Secondary | ICD-10-CM | POA: Diagnosis not present

## 2017-12-31 DIAGNOSIS — S22089D Unspecified fracture of T11-T12 vertebra, subsequent encounter for fracture with routine healing: Secondary | ICD-10-CM | POA: Diagnosis not present

## 2017-12-31 DIAGNOSIS — F0390 Unspecified dementia without behavioral disturbance: Secondary | ICD-10-CM | POA: Diagnosis not present

## 2018-01-01 DIAGNOSIS — Z9981 Dependence on supplemental oxygen: Secondary | ICD-10-CM | POA: Insufficient documentation

## 2018-01-01 DIAGNOSIS — Z79891 Long term (current) use of opiate analgesic: Secondary | ICD-10-CM | POA: Insufficient documentation

## 2018-01-03 DIAGNOSIS — S76311D Strain of muscle, fascia and tendon of the posterior muscle group at thigh level, right thigh, subsequent encounter: Secondary | ICD-10-CM | POA: Diagnosis not present

## 2018-01-03 DIAGNOSIS — I10 Essential (primary) hypertension: Secondary | ICD-10-CM | POA: Diagnosis not present

## 2018-01-03 DIAGNOSIS — F0390 Unspecified dementia without behavioral disturbance: Secondary | ICD-10-CM | POA: Diagnosis not present

## 2018-01-03 DIAGNOSIS — S22089D Unspecified fracture of T11-T12 vertebra, subsequent encounter for fracture with routine healing: Secondary | ICD-10-CM | POA: Diagnosis not present

## 2018-01-03 DIAGNOSIS — S76312D Strain of muscle, fascia and tendon of the posterior muscle group at thigh level, left thigh, subsequent encounter: Secondary | ICD-10-CM | POA: Diagnosis not present

## 2018-01-03 DIAGNOSIS — S32019D Unspecified fracture of first lumbar vertebra, subsequent encounter for fracture with routine healing: Secondary | ICD-10-CM | POA: Diagnosis not present

## 2018-01-06 DIAGNOSIS — I10 Essential (primary) hypertension: Secondary | ICD-10-CM | POA: Diagnosis not present

## 2018-01-06 DIAGNOSIS — S76312D Strain of muscle, fascia and tendon of the posterior muscle group at thigh level, left thigh, subsequent encounter: Secondary | ICD-10-CM | POA: Diagnosis not present

## 2018-01-06 DIAGNOSIS — F0390 Unspecified dementia without behavioral disturbance: Secondary | ICD-10-CM | POA: Diagnosis not present

## 2018-01-06 DIAGNOSIS — S32019D Unspecified fracture of first lumbar vertebra, subsequent encounter for fracture with routine healing: Secondary | ICD-10-CM | POA: Diagnosis not present

## 2018-01-06 DIAGNOSIS — S22089D Unspecified fracture of T11-T12 vertebra, subsequent encounter for fracture with routine healing: Secondary | ICD-10-CM | POA: Diagnosis not present

## 2018-01-06 DIAGNOSIS — S76311D Strain of muscle, fascia and tendon of the posterior muscle group at thigh level, right thigh, subsequent encounter: Secondary | ICD-10-CM | POA: Diagnosis not present

## 2018-02-06 ENCOUNTER — Other Ambulatory Visit: Payer: Self-pay

## 2018-02-06 DIAGNOSIS — F341 Dysthymic disorder: Secondary | ICD-10-CM

## 2018-02-06 MED ORDER — MIRTAZAPINE 7.5 MG PO TABS: 7.5000 mg | ORAL_TABLET | Freq: Every day | ORAL | 1 refills | Status: DC

## 2018-02-10 ENCOUNTER — Other Ambulatory Visit: Payer: Self-pay

## 2018-02-17 ENCOUNTER — Ambulatory Visit: Payer: Medicare Other | Admitting: Nurse Practitioner

## 2018-04-05 ENCOUNTER — Other Ambulatory Visit: Payer: Self-pay | Admitting: Nurse Practitioner

## 2018-04-05 DIAGNOSIS — I10 Essential (primary) hypertension: Secondary | ICD-10-CM

## 2018-04-07 ENCOUNTER — Other Ambulatory Visit: Payer: Self-pay | Admitting: Nurse Practitioner

## 2018-04-07 DIAGNOSIS — I10 Essential (primary) hypertension: Secondary | ICD-10-CM

## 2018-05-06 ENCOUNTER — Other Ambulatory Visit: Payer: Self-pay | Admitting: Nurse Practitioner

## 2018-05-06 DIAGNOSIS — I10 Essential (primary) hypertension: Secondary | ICD-10-CM

## 2018-05-14 ENCOUNTER — Other Ambulatory Visit: Payer: Self-pay | Admitting: Nurse Practitioner

## 2018-05-14 DIAGNOSIS — R32 Unspecified urinary incontinence: Secondary | ICD-10-CM

## 2018-05-16 ENCOUNTER — Other Ambulatory Visit: Payer: Self-pay

## 2018-05-28 ENCOUNTER — Telehealth: Payer: Self-pay | Admitting: Nurse Practitioner

## 2018-05-28 NOTE — Telephone Encounter (Signed)
@  [facility]   Outreach Note  05/28/2018 Name: Leslie Johnston MRN: [id number] DOB: [birth year]  Referred by: [name], NP Reason for referral : Chronic Care Management (Initial CCM outreach call was unsuccessful.)   An unsuccessful telephone outreach was attempted today. The patient was referred to the case management team by for assistance with chronic care management and care coordination.   Follow Up Plan: A HIPPA compliant phone message was left for the patient providing contact information and requesting a return call.  The CM team will reach out to the patient again over the next 7 days.  If patient returns call to provider office, please advise to call [place] at [place]  [email or web address]   ??[phone]

## 2018-06-02 NOTE — Telephone Encounter (Signed)
@  [facility]   Outreach Note  06/02/2018 Name: Leslie Johnston MRN: [id number] DOB: 06-09-1925  Referred by: [name], NP Reason for referral : Chronic Care Management (Initial CCM outreach call was unsuccessful.) and Chronic Care Management (Second CCM outreach )   A second unsuccessful telephone outreach was attempted today. The patient was referred to the case management team for assistance with chronic care management and care coordination.   Follow Up Plan: [name] daughter will return my call to schedule appointment.The care management team will reach out to the patient again over the next 7 days, if there is not return call for daughter.   [place]  [email or web address]   ??[phone]

## 2018-06-10 NOTE — Chronic Care Management (AMB) (Signed)
°  Chronic Care Management   Outreach Note  06/10/2018 Name: Leslie Johnston MRN: [id number] DOB: 28-Jan-1925  Referred by: [name], NP Reason for referral : Chronic Care Management (Initial CCM outreach call was unsuccessful.); Chronic Care Management (Second CCM outreach ); and Chronic Care Management (Third CCM outreach call was unsuccessful.)   Third unsuccessful telephone outreach was attempted today. The patient was referred to the case management team for assistance with chronic care management and care coordination. The patient's primary care provider has been notified of our unsuccessful attempts to make or maintain contact with the patient. The care management team is pleased to engage with this patient at any time in the future should he/she be interested in assistance from the care management team.   Follow Up Plan: The care management team is available to follow up with the patient after provider conversation with the patient regarding recommendation for care management engagement and subsequent re-referral to the care management team.   [place]  [email or web address]   ??[phone]

## 2018-07-14 ENCOUNTER — Other Ambulatory Visit: Payer: Self-pay | Admitting: Nurse Practitioner

## 2018-07-14 ENCOUNTER — Other Ambulatory Visit: Payer: Self-pay | Admitting: Family Medicine

## 2018-07-14 DIAGNOSIS — I10 Essential (primary) hypertension: Secondary | ICD-10-CM

## 2018-07-14 DIAGNOSIS — F341 Dysthymic disorder: Secondary | ICD-10-CM

## 2018-07-15 ENCOUNTER — Ambulatory Visit: Payer: Medicare Other

## 2018-08-04 ENCOUNTER — Telehealth: Payer: Self-pay | Admitting: Nurse Practitioner

## 2018-08-04 DIAGNOSIS — R32 Unspecified urinary incontinence: Secondary | ICD-10-CM

## 2018-08-04 MED ORDER — OXYBUTYNIN CHLORIDE 5 MG PO TABS: 2.5000 mg | ORAL_TABLET | Freq: Three times a day (TID) | ORAL | 1 refills | Status: AC

## 2018-08-04 NOTE — Telephone Encounter (Signed)
Med was reduced from tid to daily.  Now having regular incontinence.  Will resume tid.

## 2018-08-04 NOTE — Telephone Encounter (Signed)
pts medication was changed while pcp was on leave, daughter would like for it to be changed back to how it was before since pt is having more accidents.   oxybutynin (DITROPAN) 5 MG tablet [[id number]]

## 2018-08-26 ENCOUNTER — Ambulatory Visit (INDEPENDENT_AMBULATORY_CARE_PROVIDER_SITE_OTHER): Payer: Medicare Other

## 2018-08-26 DIAGNOSIS — Z0000 Encounter for general adult medical examination without abnormal findings: Secondary | ICD-10-CM | POA: Diagnosis not present

## 2018-08-26 NOTE — Patient Instructions (Signed)
Leslie Johnston , Thank you for taking time to come for your Medicare Wellness Visit. I appreciate your ongoing commitment to your health goals. Please review the following plan we discussed and let me know if I can assist you in the future.   Screening recommendations/referrals: Colonoscopy: no longer required Mammogram: no longer required Bone Density: no longer required Recommended yearly ophthalmology/optometry visit for glaucoma screening and checkup Recommended yearly dental visit for hygiene and checkup  Vaccinations: Influenza vaccine: declined Pneumococcal vaccine: up to date Tdap vaccine: up to date Shingles vaccine: not indicated     Advanced directives: please pick up a copy    Conditions/risks identified: fall preventions discussed below   Next appointment: follow up in one year for your annual welleness visit   Preventive Care 24 Years and Older, Female Preventive care refers to lifestyle choices and visits with your health care provider that can promote health and wellness. What does preventive care include?  A yearly physical exam. This is also called an annual well check.  Dental exams once or twice a year.  Routine eye exams. Ask your health care provider how often you should have your eyes checked.  Personal lifestyle choices, including:  Daily care of your teeth and gums.  Regular physical activity.  Eating a healthy diet.  Avoiding tobacco and drug use.  Limiting alcohol use.  Practicing safe sex.  Taking low-dose aspirin every day.  Taking vitamin and mineral supplements as recommended by your health care provider. What happens during an annual well check? The services and screenings done by your health care provider during your annual well check will depend on your age, overall health, lifestyle risk factors, and family history of disease. Counseling  Your health care provider may ask you questions about your:  Alcohol use.  Tobacco use.   Drug use.  Emotional well-being.  Home and relationship well-being.  Sexual activity.  Eating habits.  History of falls.  Memory and ability to understand (cognition).  Work and work Statistician.  Reproductive health. Screening  You may have the following tests or measurements:  Height, weight, and BMI.  Blood pressure.  Lipid and cholesterol levels. These may be checked every 5 years, or more frequently if you are over 72 years old.  Skin check.  Lung cancer screening. You may have this screening every year starting at age 22 if you have a 30-pack-year history of smoking and currently smoke or have quit within the past 15 years.  Fecal occult blood test (FOBT) of the stool. You may have this test every year starting at age 61.  Flexible sigmoidoscopy or colonoscopy. You may have a sigmoidoscopy every 5 years or a colonoscopy every 10 years starting at age 51.  Hepatitis C blood test.  Hepatitis B blood test.  Sexually transmitted disease (STD) testing.  Diabetes screening. This is done by checking your blood sugar (glucose) after you have not eaten for a while (fasting). You may have this done every 1-3 years.  Bone density scan. This is done to screen for osteoporosis. You may have this done starting at age 13.  Mammogram. This may be done every 1-2 years. Talk to your health care provider about how often you should have regular mammograms. Talk with your health care provider about your test results, treatment options, and if necessary, the need for more tests. Vaccines  Your health care provider may recommend certain vaccines, such as:  Influenza vaccine. This is recommended every year.  Tetanus, diphtheria, and  acellular pertussis (Tdap, Td) vaccine. You may need a Td booster every 10 years.  Zoster vaccine. You may need this after age 58.  Pneumococcal 13-valent conjugate (PCV13) vaccine. One dose is recommended after age 59.  Pneumococcal polysaccharide  (PPSV23) vaccine. One dose is recommended after age 13. Talk to your health care provider about which screenings and vaccines you need and how often you need them. This information is not intended to replace advice given to you by your health care provider. Make sure you discuss any questions you have with your health care provider. Document Released: 01/14/2015 Document Revised: 09/07/2015 Document Reviewed: 10/19/2014 Elsevier Interactive Patient Education  2017 [place] Prevention in the Home Falls can cause injuries. They can happen to people of all ages. There are many things you can do to make your home safe and to help prevent falls. What can I do on the outside of my home?  Regularly fix the edges of walkways and driveways and fix any cracks.  Remove anything that might make you trip as you walk through a door, such as a raised step or threshold.  Trim any bushes or trees on the path to your home.  Use bright outdoor lighting.  Clear any walking paths of anything that might make someone trip, such as rocks or tools.  Regularly check to see if handrails are loose or broken. Make sure that both sides of any steps have handrails.  Any raised decks and porches should have guardrails on the edges.  Have any leaves, snow, or ice cleared regularly.  Use sand or salt on walking paths during winter.  Clean up any spills in your garage right away. This includes oil or grease spills. What can I do in the bathroom?  Use night lights.  Install grab bars by the toilet and in the tub and shower. Do not use towel bars as grab bars.  Use non-skid mats or decals in the tub or shower.  If you need to sit down in the shower, use a plastic, non-slip stool.  Keep the floor dry. Clean up any water that spills on the floor as soon as it happens.  Remove soap buildup in the tub or shower regularly.  Attach bath mats securely with double-sided non-slip rug tape.  Do not have  throw rugs and other things on the floor that can make you trip. What can I do in the bedroom?  Use night lights.  Make sure that you have a light by your bed that is easy to reach.  Do not use any sheets or blankets that are too big for your bed. They should not hang down onto the floor.  Have a firm chair that has side arms. You can use this for support while you get dressed.  Do not have throw rugs and other things on the floor that can make you trip. What can I do in the kitchen?  Clean up any spills right away.  Avoid walking on wet floors.  Keep items that you use a lot in easy-to-reach places.  If you need to reach something above you, use a strong step stool that has a grab bar.  Keep electrical cords out of the way.  Do not use floor polish or wax that makes floors slippery. If you must use wax, use non-skid floor wax.  Do not have throw rugs and other things on the floor that can make you trip. What can I do with my stairs?  Do not leave any items on the stairs.  Make sure that there are handrails on both sides of the stairs and use them. Fix handrails that are broken or loose. Make sure that handrails are as long as the stairways.  Check any carpeting to make sure that it is firmly attached to the stairs. Fix any carpet that is loose or worn.  Avoid having throw rugs at the top or bottom of the stairs. If you do have throw rugs, attach them to the floor with carpet tape.  Make sure that you have a light switch at the top of the stairs and the bottom of the stairs. If you do not have them, ask someone to add them for you. What else can I do to help prevent falls?  Wear shoes that:  Do not have high heels.  Have rubber bottoms.  Are comfortable and fit you well.  Are closed at the toe. Do not wear sandals.  If you use a stepladder:  Make sure that it is fully opened. Do not climb a closed stepladder.  Make sure that both sides of the stepladder are  locked into place.  Ask someone to hold it for you, if possible.  Clearly mark and make sure that you can see:  Any grab bars or handrails.  First and last steps.  Where the edge of each step is.  Use tools that help you move around (mobility aids) if they are needed. These include:  Canes.  Walkers.  Scooters.  Crutches.  Turn on the lights when you go into a dark area. Replace any light bulbs as soon as they burn out.  Set up your furniture so you have a clear path. Avoid moving your furniture around.  If any of your floors are uneven, fix them.  If there are any pets around you, be aware of where they are.  Review your medicines with your doctor. Some medicines can make you feel dizzy. This can increase your chance of falling. Ask your doctor what other things that you can do to help prevent falls. This information is not intended to replace advice given to you by your health care provider. Make sure you discuss any questions you have with your health care provider. Document Released: 10/14/2008 Document Revised: 05/26/2015 Document Reviewed: 01/22/2014 Elsevier Interactive Patient Education  2017 [employer].

## 2018-08-26 NOTE — Progress Notes (Signed)
Subjective:   Leslie Johnston is a 85 y.o. female who presents for Medicare Annual (Subsequent) preventive examination.  This visit is being conducted via phone call  - after an attmept to do on video chat - due to the COVID-19 pandemic. This patient has given me verbal consent via phone to conduct this visit, patient states they are participating from their home address. Some vital signs may be absent or patient reported.   Patient identification: identified by name, DOB, and current address.    Review of Systems:   Cardiac Risk Factors include: advanced age (>7men, >82 women);hypertension;dyslipidemia     Objective:     Vitals: There were no vitals taken for this visit.  There is no height or weight on file to calculate BMI.  Advanced Directives 08/26/2018 11/26/2017 09/07/2017 07/09/2017 08/29/2016 08/28/2016 06/19/2016  Does Patient Have a Medical Advance Directive? No No No No No No No  Would patient like information on creating a medical advance directive? - - No - Patient declined No - Patient declined No - Patient declined - Yes (MAU/Ambulatory/Procedural Areas - Information given)    Tobacco Social History   Tobacco Use  Smoking Status Never Smoker  Smokeless Tobacco Never Used     Counseling given: Not Answered   Clinical Intake:  Pre-visit preparation completed: Yes  Pain : No/denies pain     Nutritional Risks: None Diabetes: No  How often do you need to have someone help you when you read instructions, pamphlets, or other written materials from your doctor or pharmacy?: 1 - Never  Interpreter Needed?: No  Information entered by :: [employer] Console,LPN  Past Medical History:  Diagnosis Date  . Allergy   . Hypertension    Past Surgical History:  Procedure Laterality Date  . EYE SURGERY     cataract extraction- Right  . KYPHOPLASTY N/A 11/26/2017   Procedure: [id number];  Surgeon: Hessie Knows, MD;  Location: [facility] ORS;  Service: Orthopedics;   Laterality: N/A;   Family History  Problem Relation Age of Onset  . Pancreatic cancer Sister   . Arthritis Maternal Grandmother   . Leukemia Maternal Grandfather   . Asthma Maternal Grandfather   . Breast cancer Daughter   . Bladder Cancer Daughter    Social History   Socioeconomic History  . Marital status: Widowed    Spouse name: Not on file  . Number of children: Not on file  . Years of education: Not on file  . Highest education level: Not on file  Occupational History  . Not on file  Social Needs  . Financial resource strain: Not hard at all  . Food insecurity    Worry: Never true    Inability: Never true  . Transportation needs    Medical: No    Non-medical: No  Tobacco Use  . Smoking status: Never Smoker  . Smokeless tobacco: Never Used  Substance and Sexual Activity  . Alcohol use: No    Alcohol/week: 0.0 standard drinks  . Drug use: No  . Sexual activity: Not on file  Lifestyle  . Physical activity    Days per week: 7 days    Minutes per session: 10 min  . Stress: Not at all  Relationships  . Social connections    Talks on phone: More than three times a week    Gets together: More than three times a week    Attends religious service: Never    Active member of club or organization: No  Attends meetings of clubs or organizations: Never    Relationship status: Widowed  Other Topics Concern  . Not on file  Social History Narrative  . Not on file    Outpatient Encounter Medications as of 08/26/2018  Medication Sig  . aspirin 81 MG tablet Take 81 mg by mouth daily with lunch.   . Calcium Carb-Cholecalciferol (CALCIUM 600 + D PO) Take 1 tablet by mouth daily with lunch.  . fluticasone (FLONASE) 50 MCG/ACT nasal spray SHAKE LIQUID AND USE 2 SPRAYS IN EACH NOSTRIL DAILY  . lisinopril (ZESTRIL) 20 MG tablet TAKE 1 TABLET(20 MG) BY MOUTH DAILY  . mirtazapine (REMERON) 7.5 MG tablet TAKE 1 TABLET(7.5 MG) BY MOUTH AT BEDTIME  . Multiple Vitamin  (MULTIVITAMIN WITH MINERALS) TABS tablet Take 1 tablet by mouth daily with lunch.   . oxybutynin (DITROPAN) 5 MG tablet Take 0.5 tablets (2.5 mg total) by mouth 3 (three) times daily.  . diclofenac (VOLTAREN) 50 MG EC tablet Take 1 tablet (50 mg total) by mouth 2 (two) times daily as needed for moderate pain. (Patient not taking: Reported on 08/26/2018)   No facility-administered encounter medications on file as of 08/26/2018.     Activities of Daily Living In your present state of health, do you have any difficulty performing the following activities: 08/26/2018  Hearing? Y  Comment hearing aids  Vision? N  Comment eyeglasses  Difficulty concentrating or making decisions? N  Walking or climbing stairs? N  Dressing or bathing? N  Doing errands, shopping? Y  Comment children help  Preparing Food and eating ? N  Comment children help  Using the Toilet? N  In the past six months, have you accidently leaked urine? N  Do you have problems with loss of bowel control? N  Managing your Medications? N  Managing your Finances? N  Comment children help  Housekeeping or managing your Housekeeping? N  Comment children help  Some recent data might be hidden    Patient Care Team: [name], NP as PCP - General    Assessment:   This is a routine wellness examination for Leslie Johnston.  Exercise Activities and Dietary recommendations Current Exercise Habits: Home exercise routine, Type of exercise: stretching;walking, Time (Minutes): 15, Frequency (Times/Week): 7, Weekly Exercise (Minutes/Week): 105, Intensity: Mild, Exercise limited by: None identified  Goals    . DIET - INCREASE WATER INTAKE     Recommend drinking at least 6-8 glasses of water a day     . Family to eat at dinner table more often     Pt would like some help cleaning dishes when family arrives for family meals.     . Increase water intake     Recommend drinking at least 4-5 glasses of water a day        Fall  Risk: Fall Risk  08/26/2018 07/09/2017 09/11/2016 06/19/2016 03/20/2016  Falls in the past year? 0 Yes Yes No Yes  Number falls in past yr: - 1 1 - 2 or more  Injury with Fall? - Yes Yes - No  Risk Factor Category  - High Fall Risk - - -  Risk for fall due to : - - - - History of fall(s)  Follow up - Falls prevention discussed Falls prevention discussed - -    FALL RISK PREVENTION PERTAINING TO THE HOME:  Any stairs in or around the home? Yes  steps If so, are there any without handrails? No   Home free of loose throw  rugs in walkways, pet beds, electrical cords, etc? Yes  Adequate lighting in your home to reduce risk of falls? Yes   ASSISTIVE DEVICES UTILIZED TO PREVENT FALLS:  Life alert? No  Use of a cane, walker or w/c? Yes  cane Grab bars in the bathroom? Yes  Shower chair or bench in shower? Yes  Elevated toilet seat or a handicapped toilet? No   DME ORDERS:  DME order needed?  No   TIMED UP AND GO:  Unable to perform   Depression Screen PHQ 2/9 Scores 08/26/2018 10/07/2017 07/09/2017 09/11/2016  PHQ - 2 Score 0 - 0 0  Exception Documentation - Patient refusal - -     Cognitive Function MMSE - Mini Mental State Exam 07/27/2014  Orientation to time 5  Orientation to Place 5  Registration 3  Attention/ Calculation 5  Recall 3  Language- name 2 objects 2  Language- repeat 1  Language- follow 3 step command 3  Language- read & follow direction 1  Write a sentence 1  Copy design 1  Total score 30     6CIT Screen 07/09/2017 06/19/2016  What Year? 0 points 0 points  What month? 0 points 0 points  What time? 0 points 0 points  Count back from 20 0 points 0 points  Months in reverse 0 points 0 points  Repeat phrase 2 points 0 points  Total Score 2 0    Immunization History  Administered Date(s) Administered  . Pneumococcal Conjugate-13 04/25/2013  . Pneumococcal Polysaccharide-23 06/19/2016    Qualifies for Shingles Vaccine? no  Tdap: UP TO DATE  Flu  Vaccine: Due now, declined  Pneumococcal Vaccine: up to date  Screening Tests Health Maintenance  Topic Date Due  . INFLUENZA VACCINE  08/02/2018  . TETANUS/TDAP  01/01/2021  . DEXA SCAN  Completed  . PNA vac Low Risk Adult  Completed    Cancer Screenings:  Colorectal Screening: no longer required  Mammogram: no longer required  Bone Density: no longer required   Lung Cancer Screening: (Low Dose CT Chest recommended if Age 62-80 years, 30 pack-year currently smoking OR have quit w/in 15years.) does not qualify.    Additional Screening:  Hepatitis C Screening: does not qualify  Vision Screening: Recommended annual ophthalmology exams for early detection of glaucoma and other disorders of the eye. Is the patient up to date with their annual eye exam?  Yes  Who is the provider or what is the name of the office in which the pt attends annual eye exams? Dr.Richardson    Dental Screening: Recommended annual dental exams for proper oral hygiene  Community Resource Referral:  CRR required this visit?  No       Plan:  I have personally reviewed and addressed the Medicare Annual Wellness questionnaire and have noted the following in the patient's chart:  A. Medical and social history B. Use of alcohol, tobacco or illicit drugs  C. Current medications and supplements D. Functional ability and status E.  Nutritional status F.  Physical activity G. Advance directives H. List of other physicians I.  Hospitalizations, surgeries, and ER visits in previous 12 months J.  [place] such as hearing and vision if needed, cognitive and depression L. Referrals and appointments   In addition, I have reviewed and discussed with patient certain preventive protocols, quality metrics, and best practice recommendations. A written personalized care plan for preventive services as well as general preventive health recommendations were provided to patient.  Signed,  Bevelyn Ngo, [place]  [id number] Nurse Health Advisor   Nurse Notes: none

## 2018-10-21 ENCOUNTER — Other Ambulatory Visit: Payer: Self-pay | Admitting: Family Medicine

## 2018-10-21 DIAGNOSIS — F341 Dysthymic disorder: Secondary | ICD-10-CM

## 2018-10-21 DIAGNOSIS — I10 Essential (primary) hypertension: Secondary | ICD-10-CM

## 2019-01-19 ENCOUNTER — Other Ambulatory Visit: Payer: Self-pay | Admitting: Nurse Practitioner

## 2019-01-19 ENCOUNTER — Other Ambulatory Visit: Payer: Self-pay | Admitting: Family Medicine

## 2019-01-19 DIAGNOSIS — I10 Essential (primary) hypertension: Secondary | ICD-10-CM

## 2019-01-19 DIAGNOSIS — F341 Dysthymic disorder: Secondary | ICD-10-CM

## 2019-03-25 DIAGNOSIS — H353131 Nonexudative age-related macular degeneration, bilateral, early dry stage: Secondary | ICD-10-CM | POA: Diagnosis not present

## 2019-04-13 ENCOUNTER — Other Ambulatory Visit: Payer: Self-pay | Admitting: Family Medicine

## 2019-04-13 DIAGNOSIS — F341 Dysthymic disorder: Secondary | ICD-10-CM

## 2019-04-13 DIAGNOSIS — I10 Essential (primary) hypertension: Secondary | ICD-10-CM

## 2019-04-15 ENCOUNTER — Ambulatory Visit (INDEPENDENT_AMBULATORY_CARE_PROVIDER_SITE_OTHER): Payer: Medicare Other | Admitting: Family Medicine

## 2019-04-15 ENCOUNTER — Other Ambulatory Visit: Payer: Self-pay

## 2019-04-15 ENCOUNTER — Encounter: Payer: Self-pay | Admitting: Family Medicine

## 2019-04-15 VITALS — BP 144/70 | HR 70 | Temp 97.8°F | Resp 16 | Ht 66.0 in | Wt 132.0 lb

## 2019-04-15 DIAGNOSIS — N3281 Overactive bladder: Secondary | ICD-10-CM | POA: Diagnosis not present

## 2019-04-15 DIAGNOSIS — F02818 Dementia in other diseases classified elsewhere, unspecified severity, with other behavioral disturbance: Secondary | ICD-10-CM

## 2019-04-15 DIAGNOSIS — R7309 Other abnormal glucose: Secondary | ICD-10-CM

## 2019-04-15 DIAGNOSIS — F0281 Dementia in other diseases classified elsewhere with behavioral disturbance: Secondary | ICD-10-CM | POA: Diagnosis not present

## 2019-04-15 DIAGNOSIS — I129 Hypertensive chronic kidney disease with stage 1 through stage 4 chronic kidney disease, or unspecified chronic kidney disease: Secondary | ICD-10-CM | POA: Diagnosis not present

## 2019-04-15 DIAGNOSIS — R3915 Urgency of urination: Secondary | ICD-10-CM

## 2019-04-15 DIAGNOSIS — N183 Chronic kidney disease, stage 3 unspecified: Secondary | ICD-10-CM

## 2019-04-15 DIAGNOSIS — F341 Dysthymic disorder: Secondary | ICD-10-CM | POA: Diagnosis not present

## 2019-04-15 LAB — POCT URINALYSIS DIPSTICK
Bilirubin, UA: NEGATIVE
Blood, UA: NEGATIVE
Glucose, UA: NEGATIVE
Ketones, UA: NEGATIVE
Leukocytes, UA: NEGATIVE
Nitrite, UA: NEGATIVE
Protein, UA: NEGATIVE
Spec Grav, UA: 1.015 (ref 1.010–1.025)
Urobilinogen, UA: 0.2 E.U./dL
pH, UA: 6.5 (ref 5.0–8.0)

## 2019-04-15 MED ORDER — LISINOPRIL 20 MG PO TABS: 20.0000 mg | ORAL_TABLET | Freq: Every day | ORAL | 3 refills | Status: DC

## 2019-04-15 MED ORDER — MIRABEGRON ER 25 MG PO TB24: 25.0000 mg | ORAL_TABLET | Freq: Every day | ORAL | 2 refills | Status: DC

## 2019-04-15 MED ORDER — MIRTAZAPINE 7.5 MG PO TABS: 7.5000 mg | ORAL_TABLET | Freq: Every day | ORAL | 3 refills | Status: DC

## 2019-04-15 NOTE — Patient Instructions (Addendum)
Thank you for coming to the office today.  Re ordered Lisinopril, same dose, 90 day 3 refills, also ordered MIrtazapine.  STOP Oxybutynin, higher doses can cause side effect START new Mybetriq 25mg  once daily for overactive bladder / urgency. - If cost is too high or not covered, they can try to send for approval.  OR we can try to swap back to the Oxybutynin or one of the similar meds.  Also we can refer to Urologist for a more accurate diagnosis.  Urine test today, stay tuned, if shows infection will treat otherwise will monitor.  DUE for FASTING BLOOD WORK (no food or drink after midnight before the lab appointment, only water or coffee without cream/sugar on the morning of)  SCHEDULE "Lab Only" visit in the morning at the clinic for lab draw in 1 WEEK  - Make sure Lab Only appointment is at about 1 week before your next appointment, so that results will be available  For Lab Results, once available within 2-3 days of blood draw, you can can log in to MyChart online to view your results and a brief explanation. Also, we can discuss results at next follow-up visit.   Please schedule a Follow-up Appointment to: Return in about 6 months (around 10/15/2019), or if symptoms worsen or fail to improve, for 6 month HTN, Urine.  If you have any other questions or concerns, please feel free to call the office or send a message through [facility]. You may also schedule an earlier appointment if necessary.  Additionally, you may be receiving a survey about your experience at our office within a few days to 1 week by e-mail or mail. We value your feedback.  Leslie Putnam, DO [place]

## 2019-04-15 NOTE — Progress Notes (Signed)
Subjective:    Patient ID: Leslie Johnston, female    DOB: 1925/10/28, 86 y.o.   MRN: [id number]  Leslie Johnston is a 86 y.o. female presenting on 04/15/2019 for Hypertension (refill)  History provided by daughter, Leslie Johnston.  HPI   CHRONIC HTN: Reports checking BP occasionally, can have avg BP 140/80s usually. Can fluctuate and then improve at times. They re-check if elevated. No blood work done recently, last 2019 Current Meds - Lisinopril 20mg  daily Reports good compliance, took meds today. Tolerating well, w/o complaints. Denies CP, dyspnea, HA, edema, dizziness / lightheadedness  Urinary Urgency / Frequency Chronic problem for past 3+ years, with urinary symptoms, she was previously treated and started on oxybutynin low dose TID PRN, mixed results didn't seem to help a lot, she only took low dose, now off med, did not have side effects. Has not seen urologist before. No significant incontinence or leakage but may have small problem if doesn't make it to bathroom fast enough. - Asking about possible UTI. Did not have urine culture done last time. Denies abdominal pain, dysuria, hematuria, flank pain  Dysthmia / Insomnia / history of dementia memory loss Chronic problem with some mood lability and insomnia, now has improved on Mirtazapine. History of dementia, with some gradual decline in confusion and memory issues. Assisted by daughter's Leslie Johnston and Leslie Johnston.  Depression screen [facility] 2/9 04/15/2019 04/15/2019 08/26/2018  Decreased Interest 0 0 0  Down, Depressed, Hopeless 0 0 0  PHQ - 2 Score 0 0 0  Altered sleeping 0 - -  Tired, decreased energy 0 - -  Change in appetite 0 - -  Feeling bad or failure about yourself  0 - -  Trouble concentrating 0 - -  Moving slowly or fidgety/restless 0 - -  Suicidal thoughts 0 - -  PHQ-9 Score 0 - -  Difficult doing work/chores Not difficult at all - -    Social History   Tobacco Use  . Smoking status: Never Smoker  . Smokeless tobacco: Never  Used  Substance Use Topics  . Alcohol use: No    Alcohol/week: 0.0 standard drinks  . Drug use: No    Review of Systems Per HPI unless specifically indicated above     Objective:    BP (!) 144/70 (BP Location: Left Arm, Cuff Size: Normal)   Pulse 70   Temp 97.8 F (36.6 C) (Temporal)   Resp 16   Ht 5\' 6"  (1.676 m)   Wt 132 lb (59.9 kg)   SpO2 96%   BMI 21.31 kg/m   Wt Readings from Last 3 Encounters:  04/15/19 132 lb (59.9 kg)  11/18/17 132 lb (59.9 kg)  10/15/17 135 lb 3.2 oz (61.3 kg)    Physical Exam Vitals and nursing note reviewed.  Constitutional:      General: She is not in acute distress.    Appearance: She is well-developed. She is not diaphoretic.     Comments: Well-appearing elderly 86 year old female, comfortable, cooperative  HENT:     Head: Normocephalic and atraumatic.  Eyes:     General:        Right eye: No discharge.        Left eye: No discharge.     Conjunctiva/sclera: Conjunctivae normal.  Neck:     Thyroid: No thyromegaly.  Cardiovascular:     Rate and Rhythm: Normal rate and regular rhythm.     Heart sounds: Normal heart sounds. No murmur.  Pulmonary:  Effort: Pulmonary effort is normal. No respiratory distress.     Breath sounds: Normal breath sounds. No wheezing or rales.  Musculoskeletal:        General: Normal range of motion.     Cervical back: Normal range of motion and neck supple.     Comments: Uses cane to ambulate  Lymphadenopathy:     Cervical: No cervical adenopathy.  Skin:    General: Skin is warm and dry.     Findings: No erythema or rash.  Neurological:     Mental Status: She is alert and oriented to person, place, and time.  Psychiatric:        Behavior: Behavior normal.     Comments: Well groomed, good eye contact, limited speech, appropriate thoughts, limited participation in conversation. Follows commands.        Results for orders placed or performed in visit on 04/15/19  POCT urinalysis dipstick   Result Value Ref Range   Color, UA yellow    Clarity, UA clear    Glucose, UA Negative Negative   Bilirubin, UA neg    Ketones, UA neg    Spec Grav, UA 1.015 1.010 - 1.025   Blood, UA neg    pH, UA 6.5 5.0 - 8.0   Protein, UA Negative Negative   Urobilinogen, UA 0.2 0.2 or 1.0 E.U./dL   Nitrite, UA neg    Leukocytes, UA Negative Negative   Appearance nml    Odor        Assessment & Plan:   Problem List Items Addressed This Visit    OAB (overactive bladder)   Relevant Medications   mirabegron ER (MYRBETRIQ) 25 MG TB24 tablet   Other Relevant Orders   Urine Culture   POCT urinalysis dipstick (Completed)   Dysthymia   Relevant Medications   mirtazapine (REMERON) 7.5 MG tablet   Benign hypertension with CKD (chronic kidney disease) stage III - Primary    Mildly elevated initial BP, repeat manual check improved. - Home BP readings reviewed  Complicated by CKD III    Plan:  1. Continue current BP regimen Lisinopril 20mg  daily - refilled 2. Encourage improved lifestyle - low sodium diet, stay active 3. Continue monitor BP outside office, bring readings to next visit, if persistently >140/90 or new symptoms notify office sooner 4. Follow-up 1 week labs, then q 6 month follow-up      Relevant Medications   lisinopril (ZESTRIL) 20 MG tablet   Other Relevant Orders   COMPLETE METABOLIC PANEL WITH GFR   CBC with Differential/Platelet   TSH    Other Visit Diagnoses    Urinary urgency       Relevant Medications   mirabegron ER (MYRBETRIQ) 25 MG TB24 tablet   Other Relevant Orders   Urine Culture   POCT urinalysis dipstick (Completed)   Abnormal glucose       Relevant Orders   Hemoglobin A1c   Dementia associated with other underlying disease with behavioral disturbance (HCC)       Relevant Medications   mirtazapine (REMERON) 7.5 MG tablet   Other Relevant Orders   TSH      #Urinary Urgency / Frequency / OAB Likely OAB etiology with chronic bladder dysfunction in  86 yr old female Clinically without obvious UTI, but will check UA Dipstick and urine culture for work up today F/u results DC Oxybutynin given lack of improvement and risk of side effect in elderly Offer trial Myrbetriq 25mg  daily, caution high cost, if it  is too costly or not covered and we cant get it approved we can switch to alternative agent, and caution side effect Also offer Urology Referral if indicated  #Dysthmia / Insomnia Chronic problems, mood / sleep / insomnia is controlled on Mirtazapine. refill  #Dementia - not focus of visit today. Future follow-up to consider memory screening surveillance and management.  Add labs today for next week. Follow-up results. Last result 2019.  Meds ordered this encounter  Medications  . mirtazapine (REMERON) 7.5 MG tablet    Sig: Take 1 tablet (7.5 mg total) by mouth at bedtime.    Dispense:  90 tablet    Refill:  3  . lisinopril (ZESTRIL) 20 MG tablet    Sig: Take 1 tablet (20 mg total) by mouth daily.    Dispense:  90 tablet    Refill:  3  . mirabegron ER (MYRBETRIQ) 25 MG TB24 tablet    Sig: Take 1 tablet (25 mg total) by mouth daily.    Dispense:  30 tablet    Refill:  2   Orders Placed This Encounter  Procedures  . Urine Culture  . COMPLETE METABOLIC PANEL WITH GFR    Standing Status:   Future    Standing Expiration Date:   07/02/2019  . CBC with Differential/Platelet    Standing Status:   Future    Standing Expiration Date:   07/02/2019  . Hemoglobin A1c    Standing Status:   Future    Standing Expiration Date:   07/02/2019  . TSH    Standing Status:   Future    Standing Expiration Date:   07/02/2019  . POCT urinalysis dipstick     Follow up plan: Return in about 6 months (around 10/15/2019), or if symptoms worsen or fail to improve, for 6 month HTN, Urine.  Future lab orders within 1 week, CMET CBC A1c TSH  Nobie Putnam, DO [facility] 04/15/2019, 2:33 PM

## 2019-04-15 NOTE — Assessment & Plan Note (Addendum)
Mildly elevated initial BP, repeat manual check improved. - Home BP readings reviewed  Complicated by CKD III    Plan:  1. Continue current BP regimen Lisinopril 20mg  daily - refilled 2. Encourage improved lifestyle - low sodium diet, stay active 3. Continue monitor BP outside office, bring readings to next visit, if persistently >140/90 or new symptoms notify office sooner 4. Follow-up 1 week labs, then q 6 month follow-up

## 2019-04-16 LAB — URINE CULTURE
MICRO NUMBER:: 10362897
SPECIMEN QUALITY:: ADEQUATE

## 2019-04-23 ENCOUNTER — Other Ambulatory Visit: Payer: Medicare Other

## 2019-04-23 ENCOUNTER — Other Ambulatory Visit: Payer: Self-pay

## 2019-04-23 DIAGNOSIS — F0281 Dementia in other diseases classified elsewhere with behavioral disturbance: Secondary | ICD-10-CM

## 2019-04-23 DIAGNOSIS — F02818 Dementia in other diseases classified elsewhere, unspecified severity, with other behavioral disturbance: Secondary | ICD-10-CM

## 2019-04-23 DIAGNOSIS — I129 Hypertensive chronic kidney disease with stage 1 through stage 4 chronic kidney disease, or unspecified chronic kidney disease: Secondary | ICD-10-CM | POA: Diagnosis not present

## 2019-04-23 DIAGNOSIS — N183 Chronic kidney disease, stage 3 unspecified: Secondary | ICD-10-CM | POA: Diagnosis not present

## 2019-04-23 DIAGNOSIS — R7309 Other abnormal glucose: Secondary | ICD-10-CM | POA: Diagnosis not present

## 2019-04-24 LAB — COMPLETE METABOLIC PANEL WITH GFR
AG Ratio: 1.5 (calc) (ref 1.0–2.5)
ALT: 7 U/L (ref 6–29)
AST: 16 U/L (ref 10–35)
Albumin: 3.8 g/dL (ref 3.6–5.1)
Alkaline phosphatase (APISO): 70 U/L (ref 37–153)
BUN/Creatinine Ratio: 15 (calc) (ref 6–22)
BUN: 14 mg/dL (ref 7–25)
CO2: 27 mmol/L (ref 20–32)
Calcium: 10 mg/dL (ref 8.6–10.4)
Chloride: 109 mmol/L (ref 98–110)
Creat: 0.91 mg/dL — ABNORMAL HIGH (ref 0.60–0.88)
GFR, Est African American: 64 mL/min/{1.73_m2} (ref >=60)
GFR, Est Non African American: 55 mL/min/{1.73_m2} — ABNORMAL LOW (ref >=60)
Globulin: 2.6 g/dL (calc) (ref 1.9–3.7)
Glucose, Bld: 91 mg/dL (ref 65–139)
Potassium: 4.3 mmol/L (ref 3.5–5.3)
Sodium: 144 mmol/L (ref 135–146)
Total Bilirubin: 0.5 mg/dL (ref 0.2–1.2)
Total Protein: 6.4 g/dL (ref 6.1–8.1)

## 2019-04-24 LAB — CBC WITH DIFFERENTIAL/PLATELET
Absolute Monocytes: 502 cells/uL (ref 200–950)
Basophils Absolute: 50 cells/uL (ref 0–200)
Basophils Relative: 0.8 %
Eosinophils Absolute: 260 cells/uL (ref 15–500)
Eosinophils Relative: 4.2 %
HCT: 45 % (ref 35.0–45.0)
Hemoglobin: 14.2 g/dL (ref 11.7–15.5)
Lymphs Abs: 1370 cells/uL (ref 850–3900)
MCH: 27 pg (ref 27.0–33.0)
MCHC: 31.6 g/dL — ABNORMAL LOW (ref 32.0–36.0)
MCV: 85.6 fL (ref 80.0–100.0)
MPV: 11 fL (ref 7.5–12.5)
Monocytes Relative: 8.1 %
Neutro Abs: 4018 cells/uL (ref 1500–7800)
Neutrophils Relative %: 64.8 %
Platelets: 242 10*3/uL (ref 140–400)
RBC: 5.26 10*6/uL — ABNORMAL HIGH (ref 3.80–5.10)
RDW: 12.4 % (ref 11.0–15.0)
Total Lymphocyte: 22.1 %
WBC: 6.2 10*3/uL (ref 3.8–10.8)

## 2019-04-24 LAB — HEMOGLOBIN A1C
Hgb A1c MFr Bld: 5.5 % of total Hgb (ref <5.7)
Mean Plasma Glucose: 111 (calc)
eAG (mmol/L): 6.2 (calc)

## 2019-04-24 LAB — TSH: TSH: 1.43 mIU/L (ref 0.40–4.50)

## 2019-04-30 ENCOUNTER — Telehealth: Payer: Self-pay | Admitting: *Deleted

## 2019-04-30 NOTE — Telephone Encounter (Signed)
Pt's daughter given results per Cyndia Skeeters, "She can keep upcoming apt in October 2021 w Elmyra Ricks.  1. Chemistry - Very mild reduced kidney function that is stable from previous result. - Normal electrolytes and liver function - Normal fasting sugar 2. Hemoglobin A1c (Diabetes screening) - 5.5, normal not in range of Pre-Diabetes (>5.7 to 6.4)  3. TSH Thyroid Function Tests - Normal. 4. CBC Blood Counts - Normal, no anemia, no other significant abnormality." Follow-up as planned."; she verbalized understanding; will route to office for notification.

## 2019-05-15 IMAGING — CR DG HUMERUS 2V *R*
1 series · 3 of 3 positions shown · non-contrast
Comparison: None.

CLINICAL DATA: Persistent pain after falling backwards tonight.

EXAM:
RIGHT HUMERUS - 2+ VIEW

[Series 1: dg humerus right · 0.14mm/px · 3 of 3 slices shown]
[im 1/3]
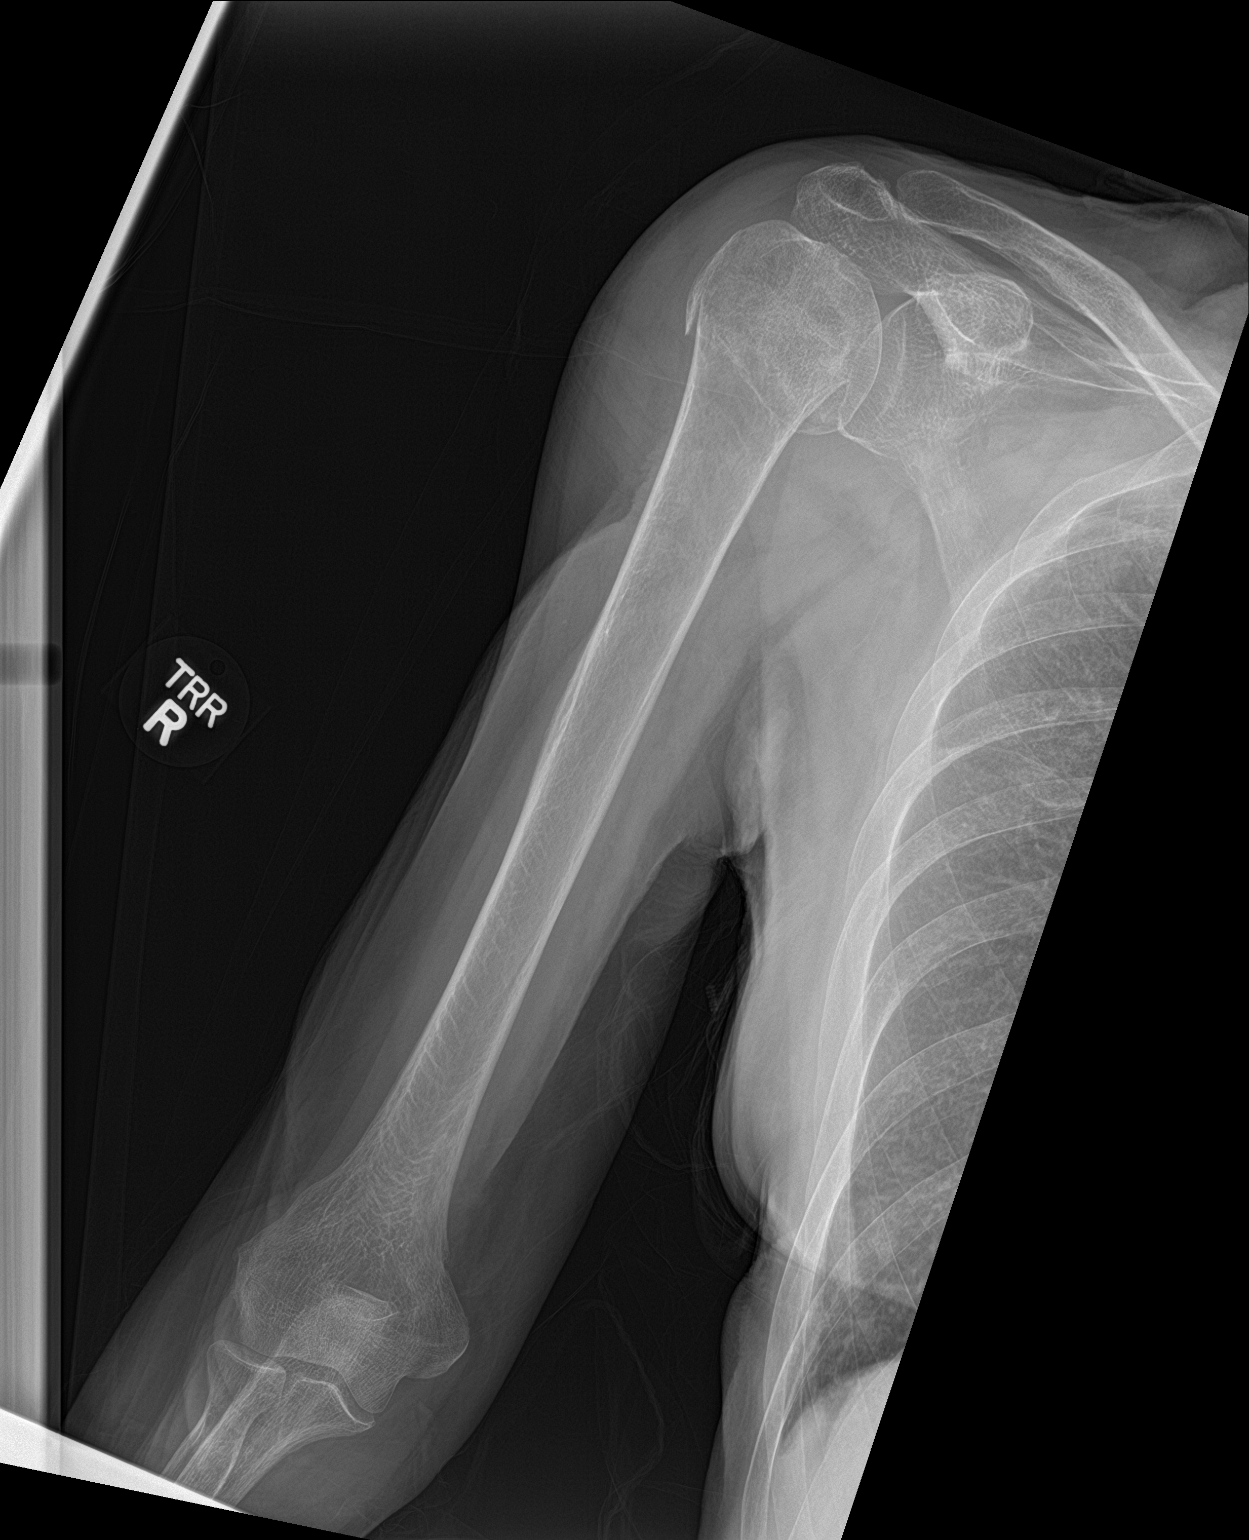
[im 2/3]
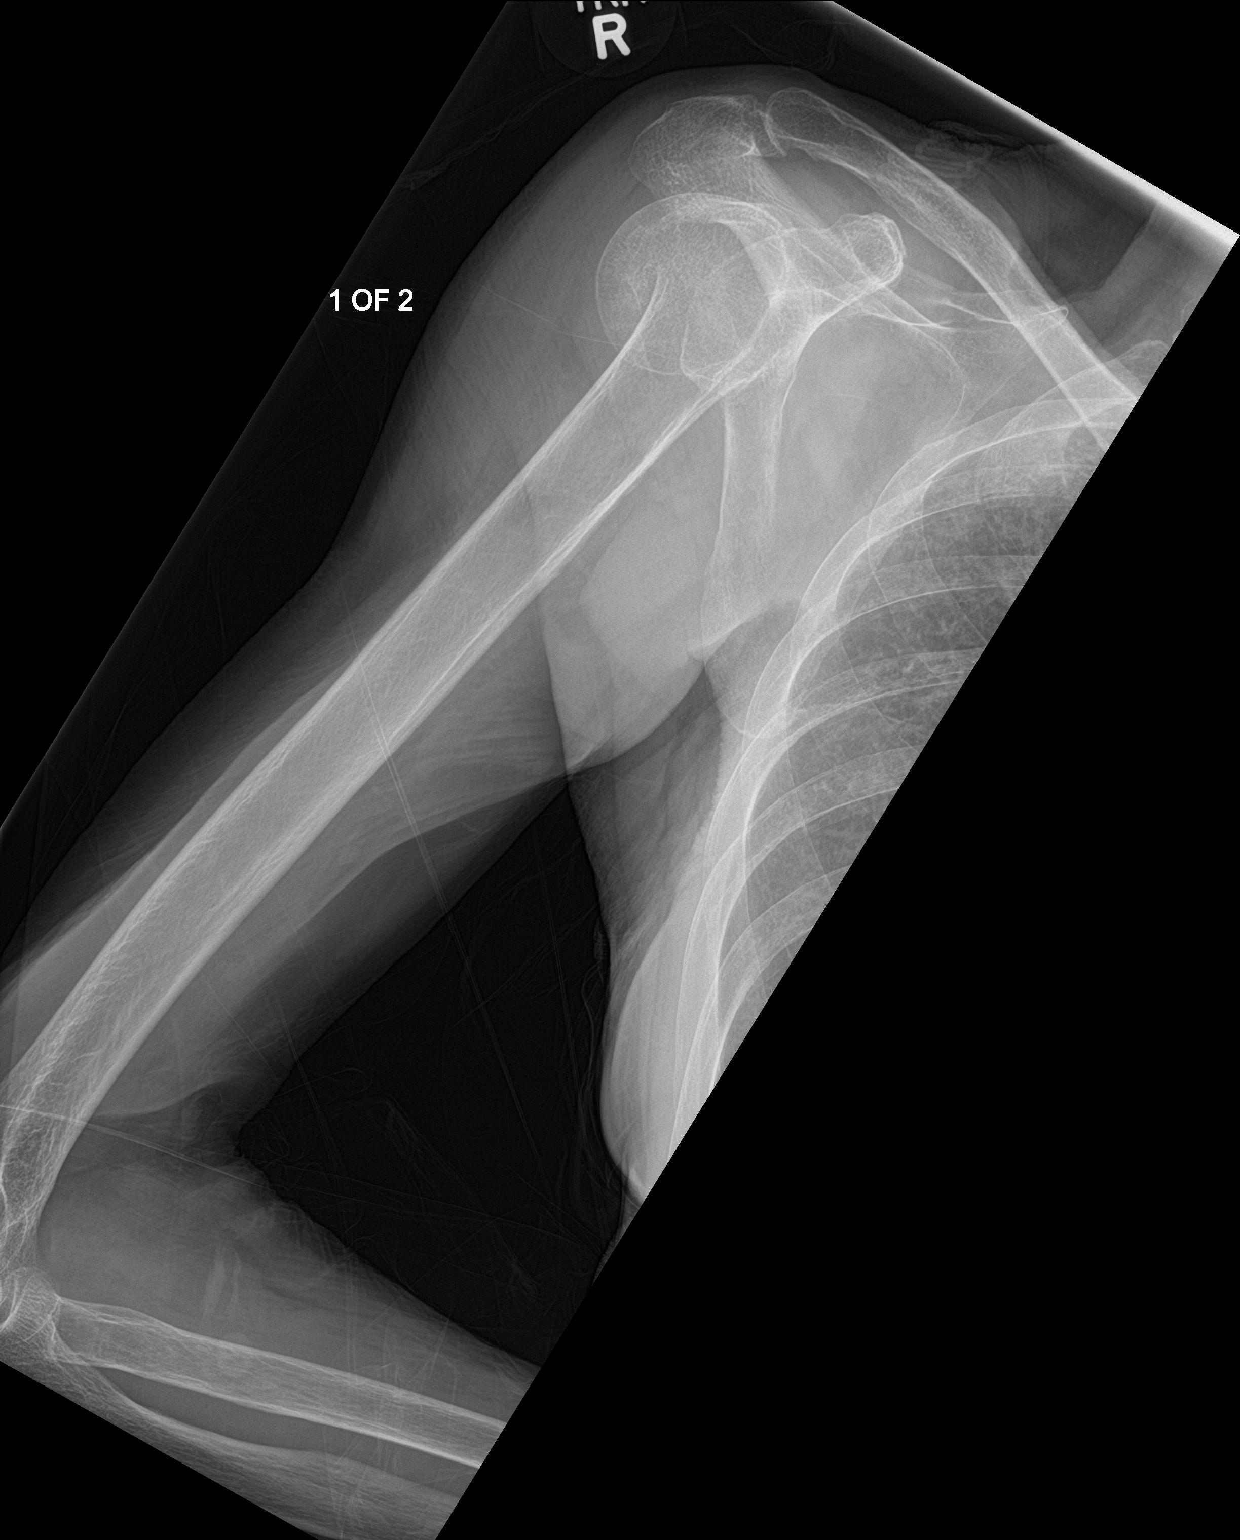
[im 3/3]
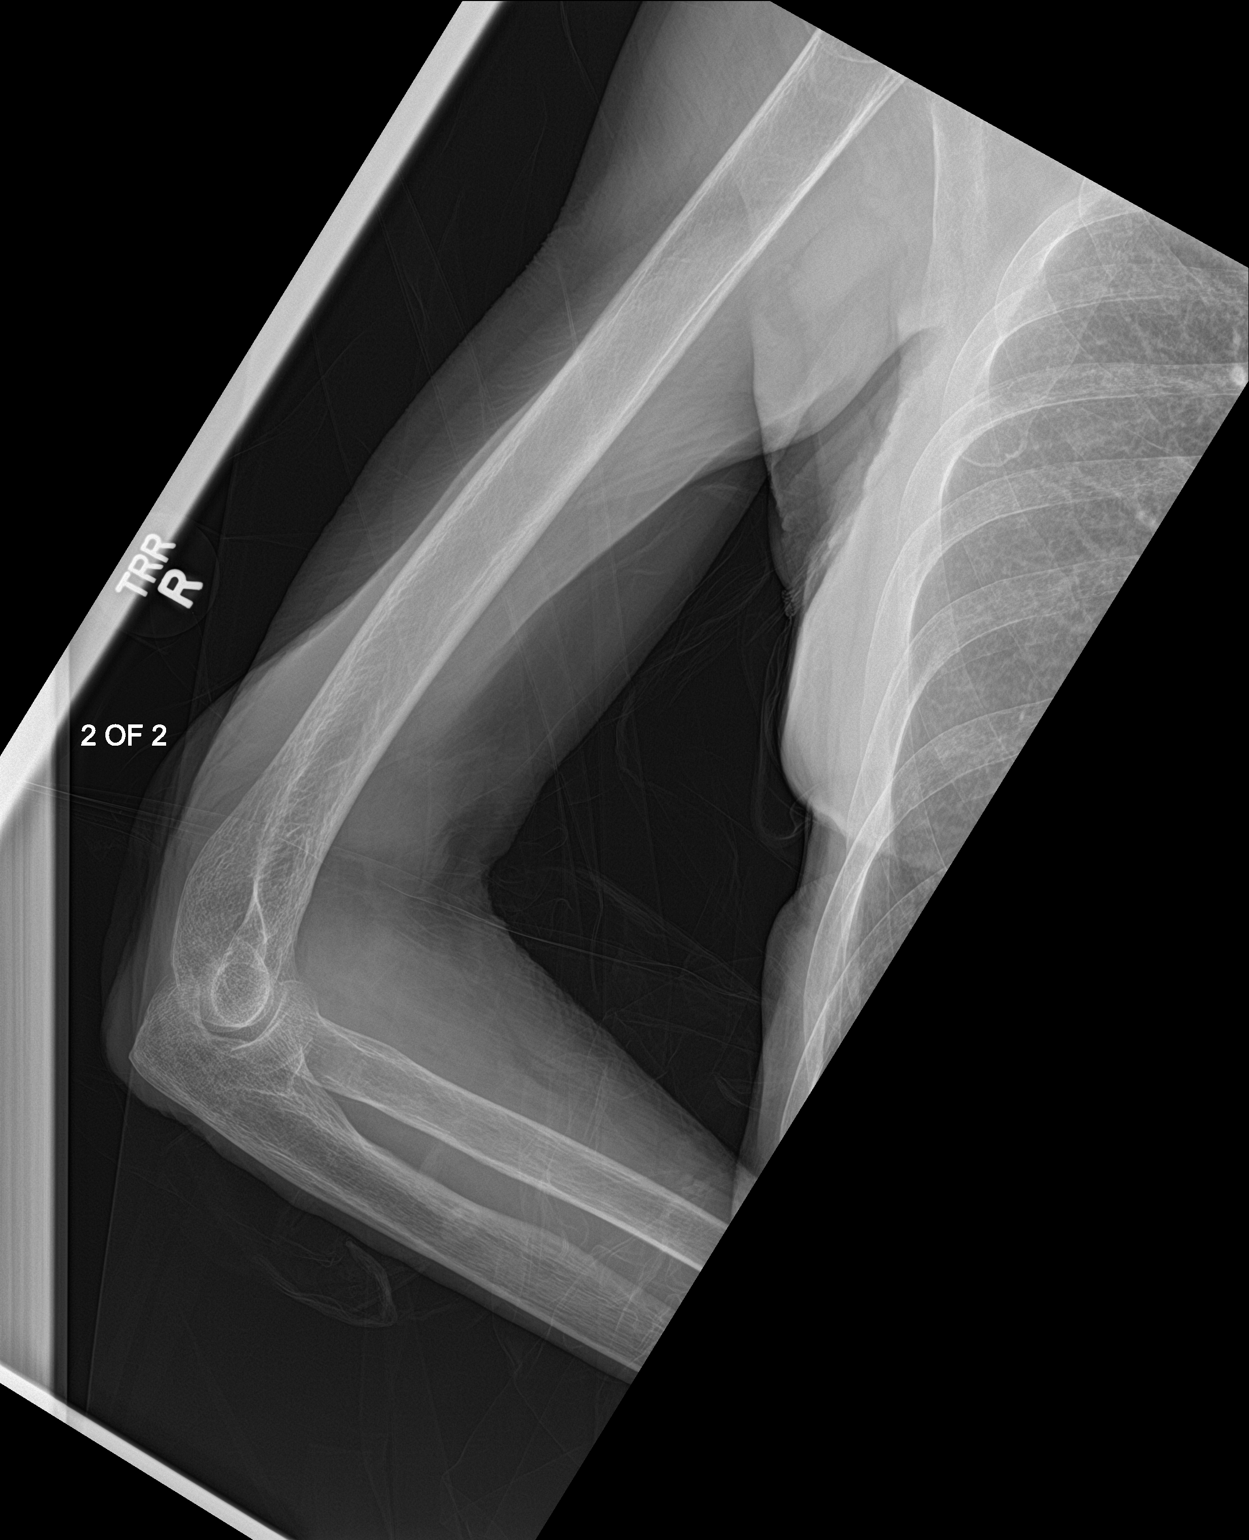

[3 of 3 positions shown; findings below may reference images not displayed]

FINDINGS: Acute slightly impacted proximal right humeral fracture. No
dislocation. No radiographic findings to suggest a pathologic basis
for the fracture. No bone lesion or bony destruction.
IMPRESSION: Slightly impacted proximal right humeral fracture.  No dislocation.

## 2019-05-15 IMAGING — CR DG HIP (WITH OR WITHOUT PELVIS) 2-3V*R*
1 series · 3 of 3 positions shown · non-contrast
Comparison: None.

CLINICAL DATA: Persistent pain after falling backwards tonight.

EXAM:
DG HIP (WITH OR WITHOUT PELVIS) 2-3V RIGHT

[Series 1: dg hip unilat w or w/o pelvis 2-3 views  · non-contrast · 0.14mm/px · 3 of 3 slices shown]
[im 1/3]
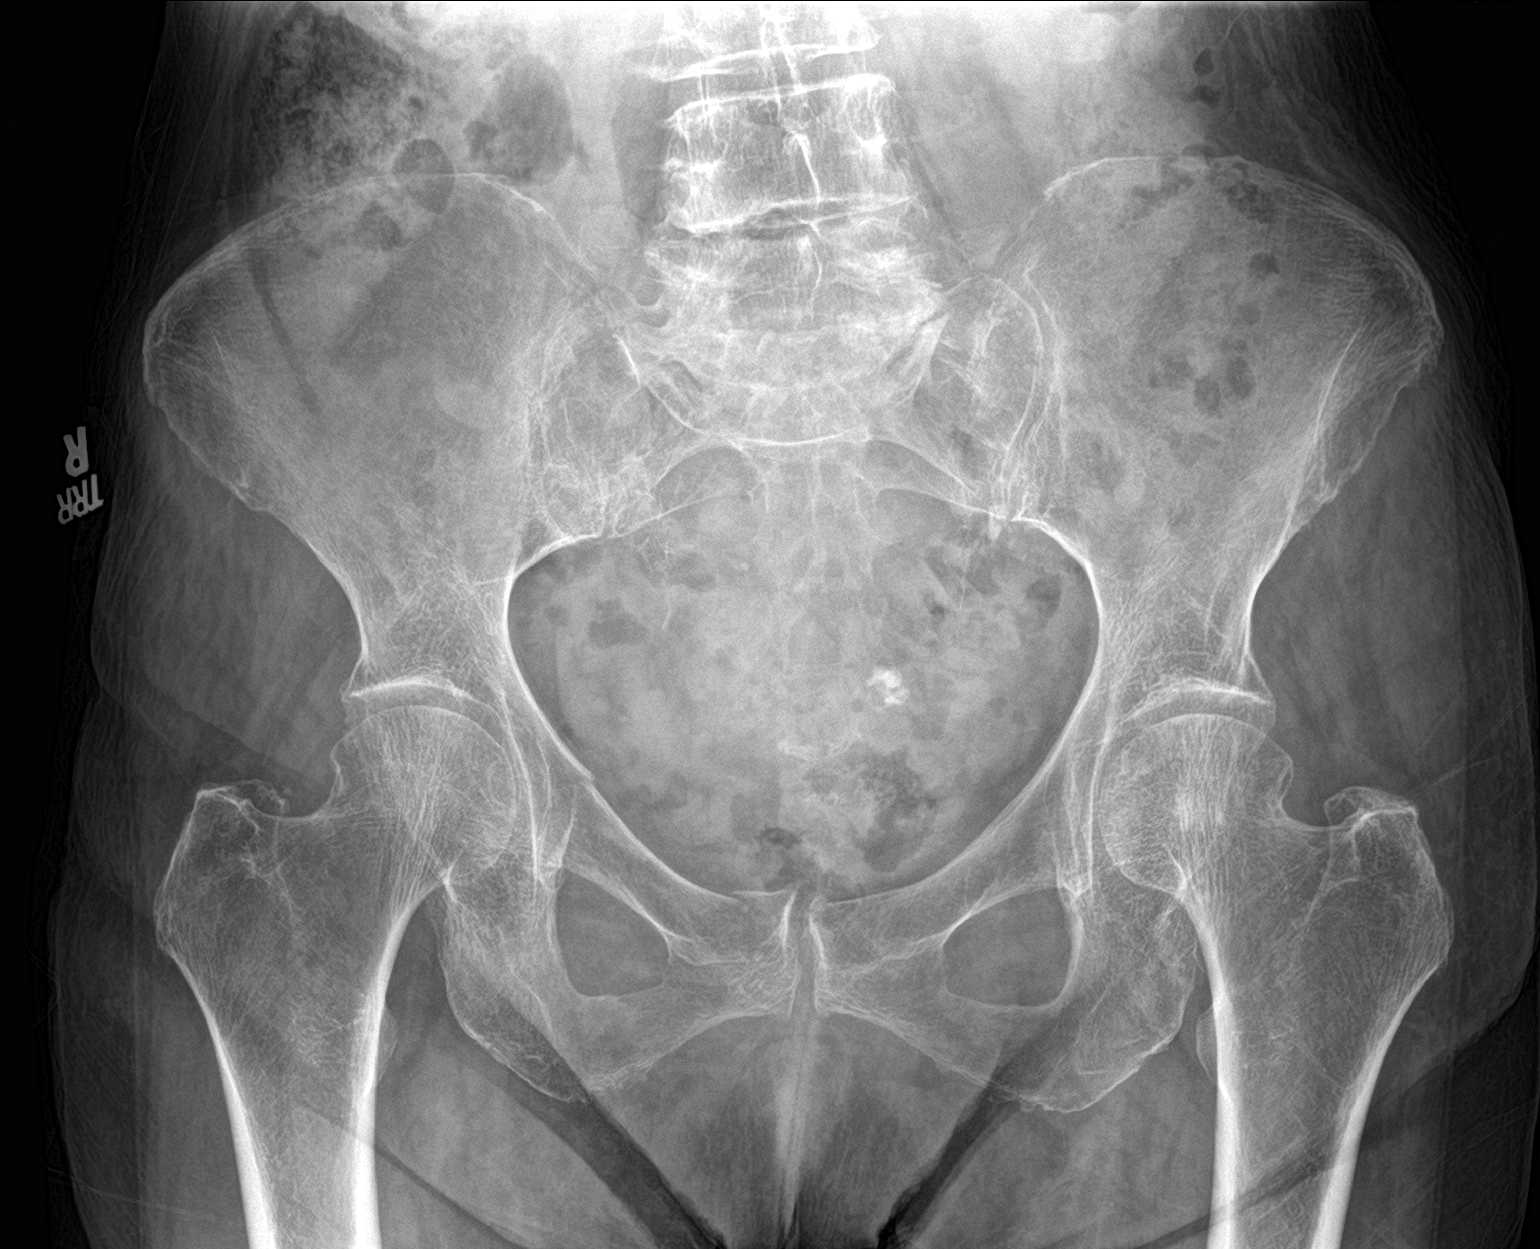
[im 2/3]
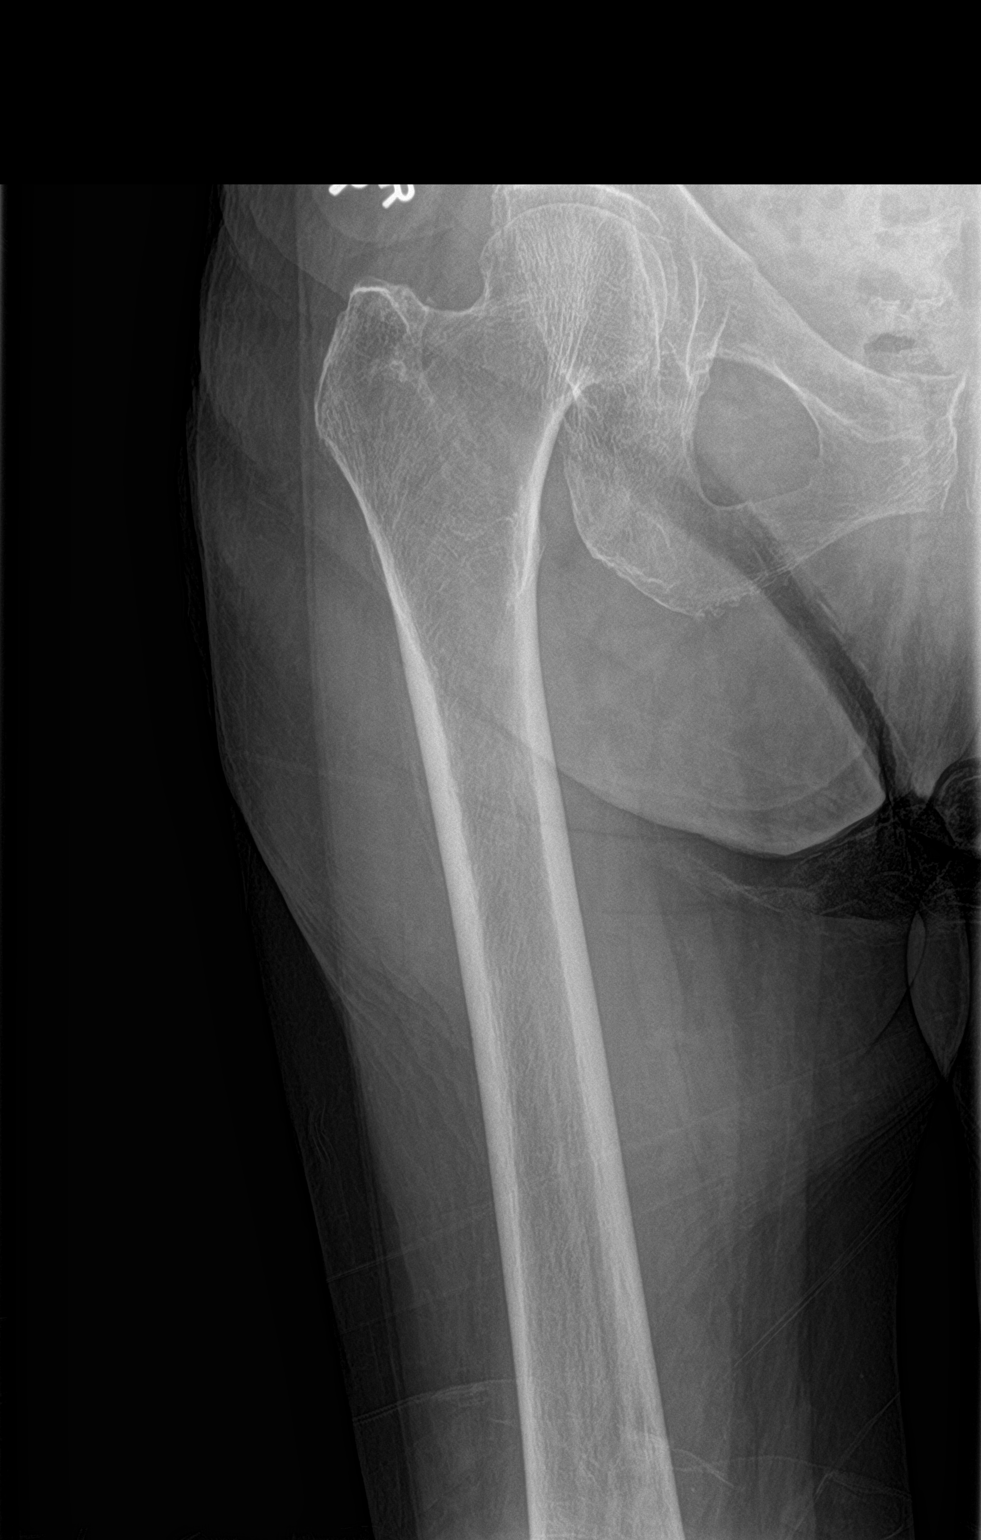
[im 3/3]
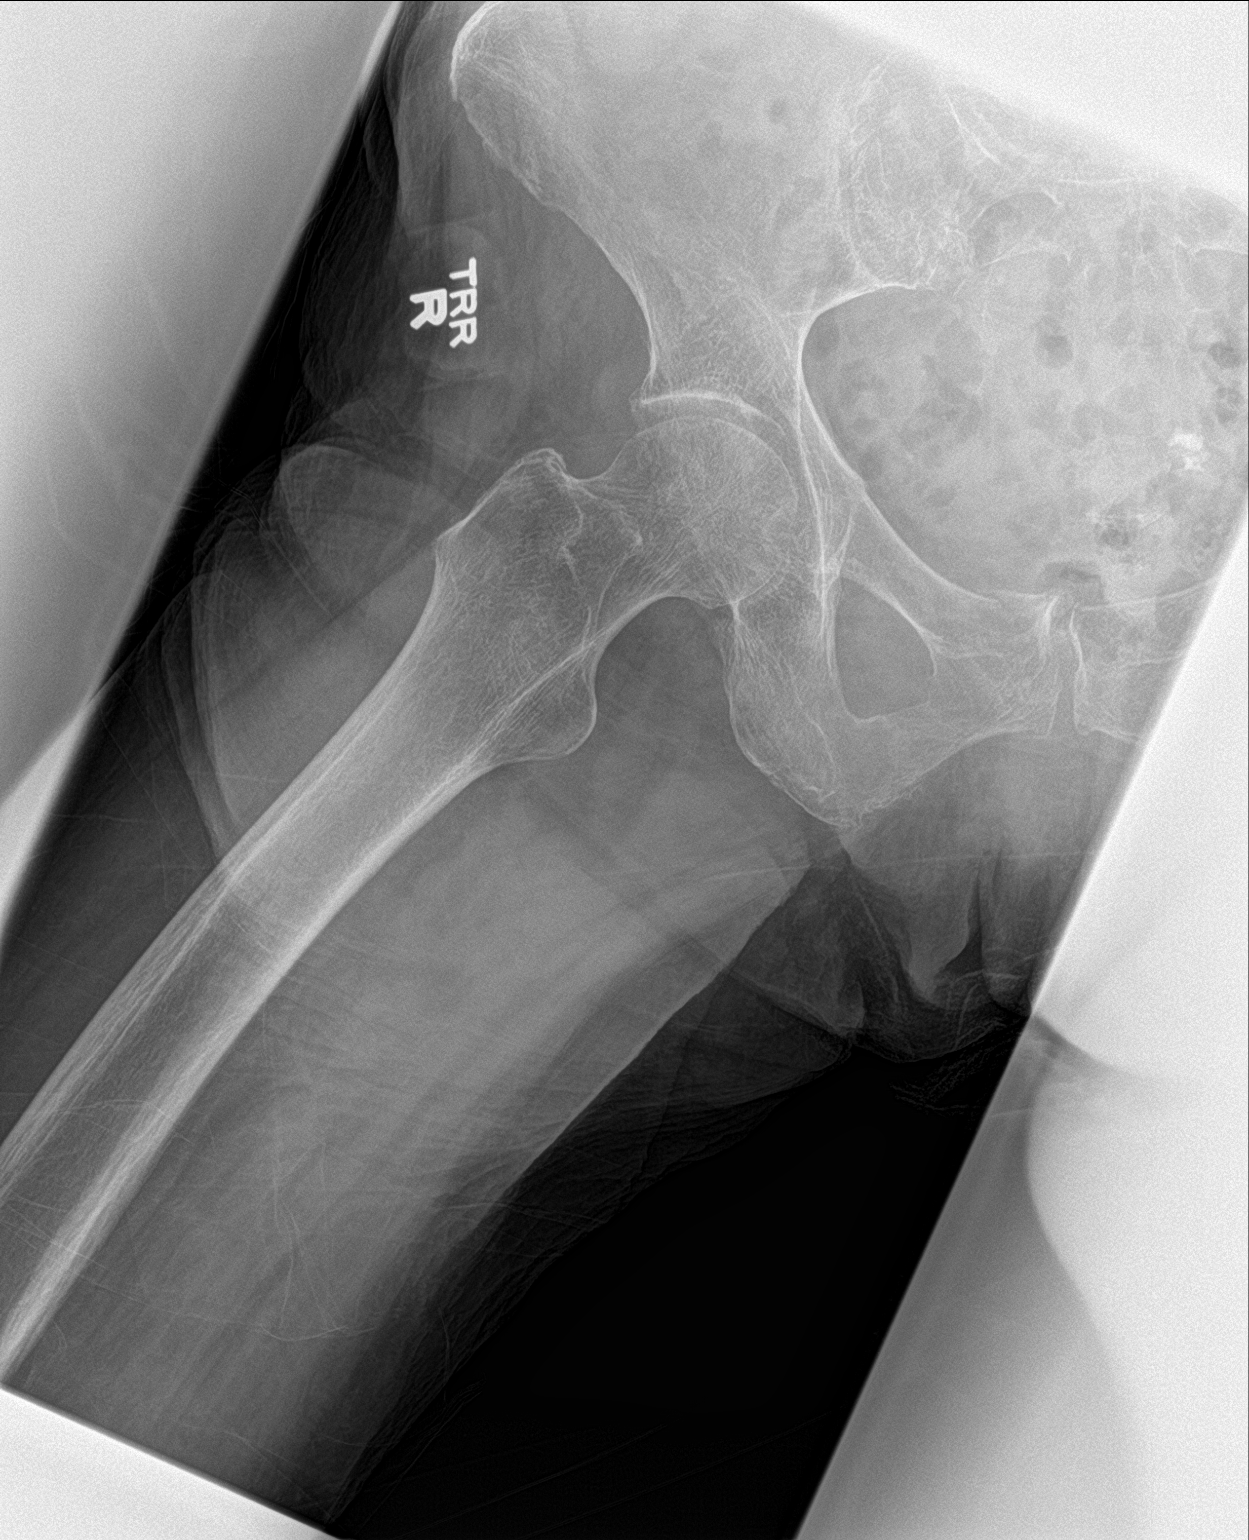

[3 of 3 positions shown; findings below may reference images not displayed]

FINDINGS: There are acute slightly displaced fractures of the right superior
and inferior pubic rami and pubic body. Hip appears intact. Pubic
symphysis and sacroiliac joints appear intact.
IMPRESSION: Right pubic fractures.  Hip appears intact.

## 2019-07-24 DIAGNOSIS — Z23 Encounter for immunization: Secondary | ICD-10-CM | POA: Diagnosis not present

## 2019-08-14 DIAGNOSIS — Z23 Encounter for immunization: Secondary | ICD-10-CM | POA: Diagnosis not present

## 2019-10-15 ENCOUNTER — Ambulatory Visit: Payer: Medicare Other | Admitting: Family Medicine

## 2020-02-10 ENCOUNTER — Encounter: Payer: Self-pay | Admitting: Emergency Medicine

## 2020-02-10 ENCOUNTER — Emergency Department: Payer: Medicare Other

## 2020-02-10 ENCOUNTER — Other Ambulatory Visit: Payer: Self-pay

## 2020-02-10 ENCOUNTER — Inpatient Hospital Stay
Admission: EM | Admit: 2020-02-10 | Discharge: 2020-02-12 | DRG: 310 | Disposition: A | Discharge status: Home / Self Care | Payer: Medicare Other | Attending: Internal Medicine | Admitting: Internal Medicine

## 2020-02-10 DIAGNOSIS — I2510 Atherosclerotic heart disease of native coronary artery without angina pectoris: Secondary | ICD-10-CM | POA: Diagnosis present

## 2020-02-10 DIAGNOSIS — I480 Paroxysmal atrial fibrillation: Secondary | ICD-10-CM | POA: Diagnosis not present

## 2020-02-10 DIAGNOSIS — Z800 Family history of malignant neoplasm of digestive organs: Secondary | ICD-10-CM

## 2020-02-10 DIAGNOSIS — I4891 Unspecified atrial fibrillation: Secondary | ICD-10-CM | POA: Diagnosis present

## 2020-02-10 DIAGNOSIS — Z825 Family history of asthma and other chronic lower respiratory diseases: Secondary | ICD-10-CM

## 2020-02-10 DIAGNOSIS — R079 Chest pain, unspecified: Secondary | ICD-10-CM

## 2020-02-10 DIAGNOSIS — Z7982 Long term (current) use of aspirin: Secondary | ICD-10-CM

## 2020-02-10 DIAGNOSIS — Z20822 Contact with and (suspected) exposure to covid-19: Secondary | ICD-10-CM | POA: Diagnosis not present

## 2020-02-10 DIAGNOSIS — M810 Age-related osteoporosis without current pathological fracture: Secondary | ICD-10-CM | POA: Diagnosis present

## 2020-02-10 DIAGNOSIS — F341 Dysthymic disorder: Secondary | ICD-10-CM | POA: Diagnosis not present

## 2020-02-10 DIAGNOSIS — F0390 Unspecified dementia without behavioral disturbance: Secondary | ICD-10-CM | POA: Diagnosis present

## 2020-02-10 DIAGNOSIS — Z888 Allergy status to other drugs, medicaments and biological substances status: Secondary | ICD-10-CM

## 2020-02-10 DIAGNOSIS — Z806 Family history of leukemia: Secondary | ICD-10-CM

## 2020-02-10 DIAGNOSIS — N1831 Chronic kidney disease, stage 3a: Secondary | ICD-10-CM | POA: Diagnosis present

## 2020-02-10 DIAGNOSIS — M80029A Age-related osteoporosis with current pathological fracture, unspecified humerus, initial encounter for fracture: Secondary | ICD-10-CM | POA: Diagnosis present

## 2020-02-10 DIAGNOSIS — K449 Diaphragmatic hernia without obstruction or gangrene: Secondary | ICD-10-CM | POA: Diagnosis not present

## 2020-02-10 DIAGNOSIS — R0789 Other chest pain: Secondary | ICD-10-CM | POA: Diagnosis not present

## 2020-02-10 DIAGNOSIS — Z8052 Family history of malignant neoplasm of bladder: Secondary | ICD-10-CM

## 2020-02-10 DIAGNOSIS — I129 Hypertensive chronic kidney disease with stage 1 through stage 4 chronic kidney disease, or unspecified chronic kidney disease: Secondary | ICD-10-CM | POA: Diagnosis not present

## 2020-02-10 DIAGNOSIS — Z880 Allergy status to penicillin: Secondary | ICD-10-CM

## 2020-02-10 DIAGNOSIS — Z803 Family history of malignant neoplasm of breast: Secondary | ICD-10-CM

## 2020-02-10 DIAGNOSIS — Z8261 Family history of arthritis: Secondary | ICD-10-CM

## 2020-02-10 DIAGNOSIS — Z87310 Personal history of (healed) osteoporosis fracture: Secondary | ICD-10-CM

## 2020-02-10 DIAGNOSIS — Z885 Allergy status to narcotic agent status: Secondary | ICD-10-CM

## 2020-02-10 DIAGNOSIS — Z79899 Other long term (current) drug therapy: Secondary | ICD-10-CM

## 2020-02-10 DIAGNOSIS — N3281 Overactive bladder: Secondary | ICD-10-CM | POA: Diagnosis present

## 2020-02-10 DIAGNOSIS — N183 Chronic kidney disease, stage 3 unspecified: Secondary | ICD-10-CM | POA: Diagnosis present

## 2020-02-10 LAB — MAGNESIUM: Magnesium: 2.4 mg/dL (ref 1.7–2.4)

## 2020-02-10 LAB — BASIC METABOLIC PANEL
Anion gap: 10 (ref 5–15)
BUN: 21 mg/dL (ref 8–23)
CO2: 23 mmol/L (ref 22–32)
Calcium: 10 mg/dL (ref 8.9–10.3)
Chloride: 107 mmol/L (ref 98–111)
Creatinine, Ser: 0.94 mg/dL (ref 0.44–1.00)
GFR, Estimated: 57 mL/min — ABNORMAL LOW (ref >60)
Glucose, Bld: 122 mg/dL — ABNORMAL HIGH (ref 70–99)
Potassium: 4.3 mmol/L (ref 3.5–5.1)
Sodium: 140 mmol/L (ref 135–145)

## 2020-02-10 LAB — CBC
HCT: 46.4 % — ABNORMAL HIGH (ref 36.0–46.0)
Hemoglobin: 14.6 g/dL (ref 12.0–15.0)
MCH: 27.4 pg (ref 26.0–34.0)
MCHC: 31.5 g/dL (ref 30.0–36.0)
MCV: 87.2 fL (ref 80.0–100.0)
Platelets: 260 10*3/uL (ref 150–400)
RBC: 5.32 MIL/uL — ABNORMAL HIGH (ref 3.87–5.11)
RDW: 13.2 % (ref 11.5–15.5)
WBC: 8.7 10*3/uL (ref 4.0–10.5)
nRBC: 0 % (ref 0.0–0.2)

## 2020-02-10 LAB — TROPONIN I (HIGH SENSITIVITY)
Troponin I (High Sensitivity): 12 ng/L (ref <18)
Troponin I (High Sensitivity): 15 ng/L (ref <18)

## 2020-02-10 LAB — TSH: TSH: 0.797 u[IU]/mL (ref 0.350–4.500)

## 2020-02-10 MED ORDER — APIXABAN 2.5 MG PO TABS
2.5000 mg | ORAL_TABLET | Freq: Two times a day (BID) | ORAL | Status: DC
  Administered 2020-02-10 – 2020-02-12 (×4): 2.5 mg via ORAL
  Filled 2020-02-10 (×5): qty 1

## 2020-02-10 MED ORDER — ACETAMINOPHEN 325 MG PO TABS: 650.0000 mg | ORAL_TABLET | ORAL | Status: DC | PRN

## 2020-02-10 MED ORDER — ADULT MULTIVITAMIN W/MINERALS CH
1.0000 | ORAL_TABLET | Freq: Every day | ORAL | Status: DC
  Administered 2020-02-11: 1 via ORAL
  Filled 2020-02-10: qty 1

## 2020-02-10 MED ORDER — OYSTER SHELL CALCIUM/D 500-5 MG-MCG PO TABS
ORAL_TABLET | Freq: Every day | ORAL | Status: DC
  Administered 2020-02-11: 1 via ORAL
  Filled 2020-02-10 (×3): qty 1

## 2020-02-10 MED ORDER — LISINOPRIL 20 MG PO TABS
20.0000 mg | ORAL_TABLET | Freq: Every day | ORAL | Status: DC
  Administered 2020-02-11 – 2020-02-12 (×2): 20 mg via ORAL
  Filled 2020-02-10 (×2): qty 1

## 2020-02-10 MED ORDER — DILTIAZEM HCL-DEXTROSE 125-5 MG/125ML-% IV SOLN (PREMIX)
5.0000 mg/h | INTRAVENOUS | Status: DC
  Administered 2020-02-10 (×2): 5 mg/h via INTRAVENOUS
  Filled 2020-02-10 (×2): qty 125

## 2020-02-10 MED ORDER — DILTIAZEM LOAD VIA INFUSION
20.0000 mg | Freq: Once | INTRAVENOUS | Status: AC
  Administered 2020-02-10: 20 mg via INTRAVENOUS
  Filled 2020-02-10: qty 20

## 2020-02-10 MED ORDER — MIRABEGRON ER 25 MG PO TB24
25.0000 mg | ORAL_TABLET | Freq: Every day | ORAL | Status: DC
  Administered 2020-02-11 – 2020-02-12 (×2): 25 mg via ORAL
  Filled 2020-02-10 (×2): qty 1

## 2020-02-10 MED ORDER — ASPIRIN EC 81 MG PO TBEC
81.0000 mg | DELAYED_RELEASE_TABLET | Freq: Every day | ORAL | Status: DC
Start: 2020-02-11 — End: 2020-02-11
  Administered 2020-02-11: 12:00:00 81 mg via ORAL
  Filled 2020-02-10: qty 1

## 2020-02-10 NOTE — H&P (Addendum)
History and Physical   DESSA LEDEE [id number] DOB: Dec 30, 1925 DOA: 02/10/2020  PCP: Verl Bangs, FNP   Patient coming from: Home  Chief Complaint: Chest discomfort  HPI: Leslie Johnston is a 87 y.o. female with medical history significant of hypertension, CKD 3, overactive bladder, osteoporosis who presents after having several hours of chest discomfort and palpitations.   She states her symptoms began after eating dinner and she initially thought it was something she ate.  Described as a discomfort/burning.  Lasted for 2 to 3 hours.  Due to persistence of symptoms she came to the ED for further evaluation.  Spoke with patient's daughter by phone who notes that she does have some chronic confusion.  She denies fever, chills, cough shortness of breath, abdominal pain, constipation, diarrhea, nausea, vomiting.  ED Course: Vital signs in the ED significant for heart rate in the 110s to 170s initially, improving.  Vital signs otherwise stable.  Lab work-up showed BMP with glucose of 122, CBC within normal limits, troponin XII, magnesium 2.4.  Chest x-ray showed no acute disease, redemonstrated hiatal hernia, thoracic aortic atherosclerosis and chronic interstitial lung markings.  Also with age-indeterminate thoracic spine fractures.  Patient started on a diltiazem drip with some improvement in heart rate to the 100s to 110s, but still remains elevated.  Review of Systems: As per HPI otherwise all other systems reviewed and are negative.  Past Medical History:  Diagnosis Date  . Allergy   . Hypertension     Past Surgical History:  Procedure Laterality Date  . EYE SURGERY     cataract extraction- Right  . KYPHOPLASTY N/A 11/26/2017   Procedure: [id number]-J18,A4;  Surgeon: Hessie Knows, MD;  Location: [facility] ORS;  Service: Orthopedics;  Laterality: N/A;    Social History  reports that she has never smoked. She has never used smokeless tobacco. She reports that she does not  drink alcohol and does not use drugs.  Allergies  Allergen Reactions  . Amlodipine Swelling  . Losartan Itching  . Penicillins Hives and Other (See Comments)    Has patient had a PCN reaction causing immediate rash, facial/tongue/throat swelling, SOB or lightheadedness with hypotension: No Has patient had a PCN reaction causing severe rash involving mucus membranes or skin necrosis: No Has patient had a PCN reaction that required hospitalization: No Has patient had a PCN reaction occurring within the last 10 years: No If all of the above answers are "NO", then may proceed with Cephalosporin use.   . Tramadol Other (See Comments)    Confusion    Family History  Problem Relation Age of Onset  . Pancreatic cancer Sister   . Arthritis Maternal Grandmother   . Leukemia Maternal Grandfather   . Asthma Maternal Grandfather   . Breast cancer Daughter   . Bladder Cancer Daughter   Reviewed on admission  Prior to Admission medications   Medication Sig Start Date End Date Taking? Authorizing Provider  aspirin 81 MG tablet Take 81 mg by mouth daily with lunch.     [provider]  Calcium Carb-Cholecalciferol (CALCIUM 600 + D PO) Take 1 tablet by mouth daily with lunch.    [provider]  lisinopril (ZESTRIL) 20 MG tablet Take 1 tablet (20 mg total) by mouth daily. 04/15/19   Karamalegos, Devonne Doughty, DO  mirabegron ER (MYRBETRIQ) 25 MG TB24 tablet Take 1 tablet (25 mg total) by mouth daily. 04/15/19   Karamalegos, Devonne Doughty, DO  mirtazapine (REMERON) 7.5 MG tablet Take  1 tablet (7.5 mg total) by mouth at bedtime. 04/15/19   Karamalegos, Devonne Doughty, DO  Multiple Vitamin (MULTIVITAMIN WITH MINERALS) TABS tablet Take 1 tablet by mouth daily with lunch.     [provider]  oxybutynin (DITROPAN) 5 MG tablet TAKE 1/2 TABLET BY MOUTH THREE TIMES DAILY Patient not taking: Reported on 04/15/2019 01/19/19   Olin Hauser, DO   Physical Exam: Vitals:   02/10/20  2018 02/10/20 2019 02/10/20 2055 02/10/20 2112  BP: (!) 140/98  (!) 138/95 133/85  Pulse: (!) 53  (!) 116 (!) 106  Resp: 18  20 18   Temp: 98.1 F (36.7 C)     TempSrc: Oral     SpO2: 98%   95%  Weight:  59 kg    Height:  5\' 7"  (1.702 m)     Physical Exam Constitutional:      General: She is not in acute distress.    Appearance: Normal appearance.  HENT:     Head: Normocephalic and atraumatic.     Mouth/Throat:     Mouth: Mucous membranes are moist.     Pharynx: Oropharynx is clear.  Eyes:     Extraocular Movements: Extraocular movements intact.     Pupils: Pupils are equal, round, and reactive to light.  Cardiovascular:     Rate and Rhythm: Tachycardia present. Rhythm irregular.     Pulses: Normal pulses.     Heart sounds: Normal heart sounds.  Pulmonary:     Effort: Pulmonary effort is normal. No respiratory distress.     Breath sounds: Normal breath sounds.  Abdominal:     General: Bowel sounds are normal. There is no distension.     Palpations: Abdomen is soft.     Tenderness: There is no abdominal tenderness.  Musculoskeletal:        General: No swelling or deformity.  Skin:    General: Skin is warm and dry.  Neurological:     General: No focal deficit present.     Comments: Some confusion with repetitive questions    Labs on Admission: I have personally reviewed following labs and imaging studies  CBC: Recent Labs  Lab 02/10/20 2028  WBC 8.7  HGB 14.6  HCT 46.4*  MCV 87.2  PLT 378    Basic Metabolic Panel: Recent Labs  Lab 02/10/20 2028  NA 140  K 4.3  CL 107  CO2 23  GLUCOSE 122*  BUN 21  CREATININE 0.94  CALCIUM 10.0  MG 2.4    GFR: Estimated Creatinine Clearance: 35.6 mL/min (by C-G formula based on SCr of 0.94 mg/dL).  Liver Function Tests: No results for input(s): AST, ALT, ALKPHOS, BILITOT, PROT, ALBUMIN in the last 168 hours.  Urine analysis:    Component Value Date/Time   COLORURINE STRAW (A) 09/07/2017 1833   APPEARANCEUR  CLEAR (A) 09/07/2017 1833   LABSPEC 1.006 09/07/2017 1833   PHURINE 7.0 09/07/2017 1833   GLUCOSEU NEGATIVE 09/07/2017 1833   HGBUR NEGATIVE 09/07/2017 1833   BILIRUBINUR neg 04/15/2019 [place] 09/07/2017 1833   PROTEINUR Negative 04/15/2019 1716   PROTEINUR NEGATIVE 09/07/2017 1833   UROBILINOGEN 0.2 04/15/2019 1716   NITRITE neg 04/15/2019 1716   NITRITE NEGATIVE 09/07/2017 1833   LEUKOCYTESUR Negative 04/15/2019 1716    Radiological Exams on Admission: DG Chest 1 View  Result Date: 02/10/2020 CLINICAL DATA:  Chest pain and burning today. EXAM: CHEST  1 VIEW COMPARISON:  12/26/2016 FINDINGS: Heart size is normal. Thoracic aortic  atherosclerosis and tortuosity. Hiatal hernia noted in the lower mediastinum. Chronic abnormal interstitial lung markings with pleural and parenchymal scarring at the apices. No evidence of edema or effusion. No infiltrate or collapse. Old augmented lower thoracic fracture. Few partial compression fractures in the thoracic spine above that, age indeterminate. IMPRESSION: No active disease. Hiatal hernia. Thoracic aortic atherosclerosis. Chronic interstitial lung markings. Few partial compression fractures in the thoracic spine above that, age indeterminate. Electronically Signed   By: Nelson Chimes M.D.   On: 02/10/2020 20:42   EKG: Independently reviewed.  Atrial fibrillation with RVR at 172 bpm.  Assessment/Plan Principal Problem:   Atrial fibrillation with RVR (HCC) Active Problems:   Benign hypertension with CKD (chronic kidney disease) stage III   Postmenopausal osteoporosis with pathological fracture of humerus   OAB (overactive bladder)   Dysthymia  Atrial fibrillation with RVR > New onset atrial fibrillation.  Noticed some chest pain and palpitations after eating dinner today. > BMP and magnesium normal in ED. > Started on dilt drip with some improvement but heart rate remains elevated > CHA2DS2-VASc of 4, eligible for Eliquis 2.5  mg twice daily due to age greater than 49 and weight less than 60 kg. > Patient and daughter deny any history of frequent/recent falls - Admit to progressive cardiac - Continue diltiazem drip - Start Eliquis 2.5 mg twice daily - Referral for A. fib clinic on discharge - Echocardiogram - Check TSH  Hypertension - Blood pressure in the 130s in ED - Continue home lisinopril  Overactive bladder - Continue home Myrbetriq  Osteoporosis > Chest x-ray showed some age indeterminant compression fractures - Continue home calcium and vitamin D  Dysthymia - Continue home Remeron  Confusion > Per daughter she reports this is chronic, patient will get frustrated if you ask her about it too much so they tend not to. - Obtain permission from [facility] for family member to stay with patient while admitted - Delirium precautions  DVT prophylaxis: Eliquis as above  Code Status:   Full  Family Communication:  Daughter, Oletta Cohn contacted by phone at [phone], she was updated and also will be planning to stay with patient.  Disposition Plan:   Patient is from:  Home  Anticipated DC to:  Home  Anticipated DC date:  1 to 2 days  Anticipated DC barriers: None  Consults called:  None  Admission status:  Observation, progressive cardiac Severity of Illness: The appropriate patient status for this patient is OBSERVATION. Observation status is judged to be reasonable and necessary in order to provide the required intensity of service to ensure the patient's safety. The patient's presenting symptoms, physical exam findings, and initial radiographic and laboratory data in the context of their medical condition is felt to place them at decreased risk for further clinical deterioration. Furthermore, it is anticipated that the patient will be medically stable for discharge from the hospital within 2 midnights of admission. The following factors support the patient status of observation.   " The patient's  presenting symptoms include chest pain and palpitations. " The physical exam findings include tachycardia with a regular rhythm. " The initial radiographic and laboratory data are EKG with A. fib with RVR.Marland Kitchen  Marcelyn Bruins MD [facility]  How to contact the [facility] Attending or Consulting provider [place] or covering provider during after hours [place], for this patient?   1. Check the care team in [facility] and look for a) attending/consulting [place] provider listed and b) the  [place] team listed 2. Log into [email or web address] and use [place]'s universal password to access. If you do not have the password, please contact the hospital operator. 3. Locate the [facility] provider you are looking for under [facility] and page to a number that you can be directly reached. 4. If you still have difficulty reaching the provider, please page the [facility] (Director on Call) for the Hospitalists listed on amion for assistance.  02/10/2020, 9:51 PM

## 2020-02-10 NOTE — ED Provider Notes (Signed)
[facility] Emergency Department Provider Note ____________________________________________   Event Date/Time   First MD Initiated Contact with Patient 02/10/20 2019     (approximate)  I have reviewed the triage vital signs and the nursing notes.  HISTORY  Chief Complaint Chest Pain   HPI Leslie Johnston is a 87 y.o. femalewho presents to the ED for evaluation of chest pain.   Chart review indicates hx HTN on lisinopril.  No cardiac history or history of A. Fib.  Patient presents to the ED with about 2 hours of chest palpitations and discomfort that started as she was seated eating dinner.  She reports thinking it was acid reflux or indigestion, due to it worsening after eating dinner.  She denies any emesis, syncope, fever, shortness of breath, falls or trauma.  Currently reporting vague palpitations and mild discomfort that she has difficulty describing.  Denies additional or preceding complaints.   Past Medical History:  Diagnosis Date  . Allergy   . Hypertension     Patient Active Problem List   Diagnosis Date Noted  . OAB (overactive bladder) 04/15/2019  . Dysthymia 04/15/2019  . Postmenopausal osteoporosis with pathological fracture of humerus 09/13/2016  . Humerus fracture 08/29/2016  . Chronic seasonal allergic rhinitis 03/20/2016  . Abnormal chest x-ray 09/19/2015  . Benign hypertension with CKD (chronic kidney disease) stage III 07/27/2014    Past Surgical History:  Procedure Laterality Date  . EYE SURGERY     cataract extraction- Right  . KYPHOPLASTY N/A 11/26/2017   Procedure: [id number],E3;  Surgeon: Hessie Knows, MD;  Location: [facility] ORS;  Service: Orthopedics;  Laterality: N/A;    Prior to Admission medications   Medication Sig Start Date End Date Taking? Authorizing Provider  aspirin 81 MG tablet Take 81 mg by mouth daily with lunch.     [provider]  Calcium Carb-Cholecalciferol (CALCIUM 600 + D PO) Take  1 tablet by mouth daily with lunch.    [provider]  lisinopril (ZESTRIL) 20 MG tablet Take 1 tablet (20 mg total) by mouth daily. 04/15/19   Karamalegos, Devonne Doughty, DO  mirabegron ER (MYRBETRIQ) 25 MG TB24 tablet Take 1 tablet (25 mg total) by mouth daily. 04/15/19   Karamalegos, Devonne Doughty, DO  mirtazapine (REMERON) 7.5 MG tablet Take 1 tablet (7.5 mg total) by mouth at bedtime. 04/15/19   Karamalegos, Devonne Doughty, DO  Multiple Vitamin (MULTIVITAMIN WITH MINERALS) TABS tablet Take 1 tablet by mouth daily with lunch.     [provider]  oxybutynin (DITROPAN) 5 MG tablet TAKE 1/2 TABLET BY MOUTH THREE TIMES DAILY Patient not taking: Reported on 04/15/2019 01/19/19   Olin Hauser, DO    Allergies Amlodipine, Losartan, Penicillins, and Tramadol  Family History  Problem Relation Age of Onset  . Pancreatic cancer Sister   . Arthritis Maternal Grandmother   . Leukemia Maternal Grandfather   . Asthma Maternal Grandfather   . Breast cancer Daughter   . Bladder Cancer Daughter     Social History Social History   Tobacco Use  . Smoking status: Never Smoker  . Smokeless tobacco: Never Used  Vaping Use  . Vaping Use: Never used  Substance Use Topics  . Alcohol use: No    Alcohol/week: 0.0 standard drinks  . Drug use: No    Review of Systems  Constitutional: No fever/chills Eyes: No visual changes. ENT: No sore throat. Cardiovascular: Positive for palpitations and chest pain. Respiratory: Denies shortness of breath. Gastrointestinal: No  abdominal pain.  No nausea, no vomiting.  No diarrhea.  No constipation. Genitourinary: Negative for dysuria. Musculoskeletal: Negative for back pain. Skin: Negative for rash. Neurological: Negative for headaches, focal weakness or numbness.  ____________________________________________   PHYSICAL EXAM:  VITAL SIGNS: Vitals:   02/10/20 2018  BP: (!) 140/98  Pulse: (!) 53  Resp: 18  Temp: 98.1 F (36.7 C)   SpO2: 98%     Constitutional: Alert and oriented. Well appearing and in no acute distress.  Hard of hearing.  Sitting up in bed and conversational. Eyes: Conjunctivae are normal. PERRL. EOMI. Head: Atraumatic. Nose: No congestion/rhinnorhea. Mouth/Throat: Mucous membranes are moist.  Oropharynx non-erythematous. Neck: No stridor. No cervical spine tenderness to palpation. Cardiovascular: Tachycardic and irregular. Grossly normal heart sounds.  Good peripheral circulation. Respiratory: Normal respiratory effort.  No retractions. Lungs CTAB. Gastrointestinal: Soft , nondistended, nontender to palpation. No CVA tenderness. Musculoskeletal: No lower extremity tenderness nor edema.  No joint effusions. No signs of acute trauma. Neurologic:  Normal speech and language. No gross focal neurologic deficits are appreciated. No gait instability noted. Skin:  Skin is warm, dry and intact. No rash noted. Psychiatric: Mood and affect are normal. Speech and behavior are normal.  ____________________________________________   LABS (all labs ordered are listed, but only abnormal results are displayed)  Labs Reviewed  CBC - Abnormal; Notable for the following components:      Result Value   RBC 5.32 (*)    HCT 46.4 (*)    All other components within normal limits  BASIC METABOLIC PANEL  MAGNESIUM  TROPONIN I (HIGH SENSITIVITY)   ____________________________________________  12 Lead EKG  Atrial fibrillation, rate of 172 bpm.  Leftward axis.  Normal intervals.  No evidence of acute ischemia. ____________________________________________  RADIOLOGY  ED MD interpretation: 1 view CXR reviewed by me without evidence of acute cardiopulmonary pathology.  Official radiology report(s): DG Chest 1 View  Result Date: 02/10/2020 CLINICAL DATA:  Chest pain and burning today. EXAM: CHEST  1 VIEW COMPARISON:  12/26/2016 FINDINGS: Heart size is normal. Thoracic aortic atherosclerosis and tortuosity.  Hiatal hernia noted in the lower mediastinum. Chronic abnormal interstitial lung markings with pleural and parenchymal scarring at the apices. No evidence of edema or effusion. No infiltrate or collapse. Old augmented lower thoracic fracture. Few partial compression fractures in the thoracic spine above that, age indeterminate. IMPRESSION: No active disease. Hiatal hernia. Thoracic aortic atherosclerosis. Chronic interstitial lung markings. Few partial compression fractures in the thoracic spine above that, age indeterminate. Electronically Signed   By: Nelson Chimes M.D.   On: 02/10/2020 20:42    ____________________________________________   PROCEDURES and INTERVENTIONS  Procedure(s) performed (including Critical Care):  .1-3 Lead EKG Interpretation Performed by: Vladimir Crofts, MD Authorized by: Vladimir Crofts, MD     Interpretation: abnormal     ECG rate:  164   ECG rate assessment: tachycardic     Rhythm: atrial fibrillation     Ectopy: none     Conduction: normal   .Critical Care Performed by: Vladimir Crofts, MD Authorized by: Vladimir Crofts, MD   Critical care provider statement:    Critical care time (minutes):  32   Critical care was necessary to treat or prevent imminent or life-threatening deterioration of the following conditions:  Cardiac failure   Critical care was time spent personally by me on the following activities:  Discussions with consultants, evaluation of patient's response to treatment, examination of patient, ordering and performing treatments and interventions, ordering and review  of laboratory studies, ordering and review of radiographic studies, pulse oximetry, re-evaluation of patient's condition, obtaining history from patient or surrogate and review of old charts    Medications  diltiazem (CARDIZEM) 1 mg/mL load via infusion 20 mg (20 mg Intravenous Bolus from Bag 02/10/20 2035)    And  diltiazem (CARDIZEM) 125 mg in dextrose 5% 125 mL (1 mg/mL) infusion (5  mg/hr Intravenous New Bag/Given 02/10/20 2035)    ____________________________________________   MDM / ED COURSE   Quite healthy 87 year old woman without cardiac history presents to the ED with a couple hours of palpitations and chest discomfort, found to be in A. fib with RVR new onset, requiring diltiazem drip and medical admission.  Tachycardic to the 140s in A. fib with RVR, but hemodynamically stable on room air.  Clinically looks well without evidence of distress, neurovascular deficits or signs of trauma.  She is hard of hearing.  EKG is nonischemic demonstrating A. fib with RVR.  Electrolytes within normal limits.  CXR without evidence of acute cardiopulmonary pathology.  Diltiazem bolus and infusion with improving rates and symptoms with rate control.  Due to new onset and rates, we will admit the patient to hospitalist medicine for further work-up and management.   Clinical Course as of 02/10/20 2328  Wed Feb 10, 2020  2103 Reassessed.  Rates improving to 110. [DS]    Clinical Course User Index [DS] Vladimir Crofts, MD    ____________________________________________   FINAL CLINICAL IMPRESSION(S) / ED DIAGNOSES  Final diagnoses:  New onset atrial fibrillation ([place])  Atrial fibrillation with RVR ([place])  Other chest pain     ED Discharge Orders    None       Lurline Caver   Note:  This document was prepared using Dragon voice recognition software and may include unintentional dictation errors.   Vladimir Crofts, MD 02/10/20 2329

## 2020-02-10 NOTE — ED Triage Notes (Signed)
Pt to ED from home c/o mid chest pain and burning today after eating.  Denies n/v/d or SOB.  Pt A&Ox4, chest rise even and unlabored, skin WNL.  Only hx HTN.

## 2020-02-11 ENCOUNTER — Encounter: Payer: Self-pay | Admitting: Internal Medicine

## 2020-02-11 ENCOUNTER — Other Ambulatory Visit: Payer: Self-pay

## 2020-02-11 ENCOUNTER — Inpatient Hospital Stay (HOSPITAL_COMMUNITY)
Admit: 2020-02-11 | Discharge: 2020-02-11 | Disposition: A | Discharge status: Home / Self Care | Payer: Medicare Other | Attending: Internal Medicine | Admitting: Internal Medicine

## 2020-02-11 DIAGNOSIS — Z7189 Other specified counseling: Secondary | ICD-10-CM

## 2020-02-11 DIAGNOSIS — I361 Nonrheumatic tricuspid (valve) insufficiency: Secondary | ICD-10-CM

## 2020-02-11 DIAGNOSIS — I129 Hypertensive chronic kidney disease with stage 1 through stage 4 chronic kidney disease, or unspecified chronic kidney disease: Secondary | ICD-10-CM

## 2020-02-11 DIAGNOSIS — Z79899 Other long term (current) drug therapy: Secondary | ICD-10-CM | POA: Diagnosis not present

## 2020-02-11 DIAGNOSIS — Z880 Allergy status to penicillin: Secondary | ICD-10-CM | POA: Diagnosis not present

## 2020-02-11 DIAGNOSIS — I10 Essential (primary) hypertension: Secondary | ICD-10-CM | POA: Diagnosis not present

## 2020-02-11 DIAGNOSIS — R0789 Other chest pain: Secondary | ICD-10-CM

## 2020-02-11 DIAGNOSIS — M810 Age-related osteoporosis without current pathological fracture: Secondary | ICD-10-CM | POA: Diagnosis present

## 2020-02-11 DIAGNOSIS — F341 Dysthymic disorder: Secondary | ICD-10-CM | POA: Diagnosis present

## 2020-02-11 DIAGNOSIS — I4891 Unspecified atrial fibrillation: Secondary | ICD-10-CM

## 2020-02-11 DIAGNOSIS — Z825 Family history of asthma and other chronic lower respiratory diseases: Secondary | ICD-10-CM | POA: Diagnosis not present

## 2020-02-11 DIAGNOSIS — I517 Cardiomegaly: Secondary | ICD-10-CM

## 2020-02-11 DIAGNOSIS — Z885 Allergy status to narcotic agent status: Secondary | ICD-10-CM | POA: Diagnosis not present

## 2020-02-11 DIAGNOSIS — N183 Chronic kidney disease, stage 3 unspecified: Secondary | ICD-10-CM | POA: Diagnosis not present

## 2020-02-11 DIAGNOSIS — F0390 Unspecified dementia without behavioral disturbance: Secondary | ICD-10-CM

## 2020-02-11 DIAGNOSIS — Z7982 Long term (current) use of aspirin: Secondary | ICD-10-CM | POA: Diagnosis not present

## 2020-02-11 DIAGNOSIS — Z806 Family history of leukemia: Secondary | ICD-10-CM | POA: Diagnosis not present

## 2020-02-11 DIAGNOSIS — Z8052 Family history of malignant neoplasm of bladder: Secondary | ICD-10-CM | POA: Diagnosis not present

## 2020-02-11 DIAGNOSIS — N3281 Overactive bladder: Secondary | ICD-10-CM | POA: Diagnosis present

## 2020-02-11 DIAGNOSIS — Z800 Family history of malignant neoplasm of digestive organs: Secondary | ICD-10-CM | POA: Diagnosis not present

## 2020-02-11 DIAGNOSIS — Z87310 Personal history of (healed) osteoporosis fracture: Secondary | ICD-10-CM | POA: Diagnosis not present

## 2020-02-11 DIAGNOSIS — Z20822 Contact with and (suspected) exposure to covid-19: Secondary | ICD-10-CM | POA: Diagnosis present

## 2020-02-11 DIAGNOSIS — Z8261 Family history of arthritis: Secondary | ICD-10-CM | POA: Diagnosis not present

## 2020-02-11 DIAGNOSIS — Z888 Allergy status to other drugs, medicaments and biological substances status: Secondary | ICD-10-CM | POA: Diagnosis not present

## 2020-02-11 DIAGNOSIS — Z803 Family history of malignant neoplasm of breast: Secondary | ICD-10-CM | POA: Diagnosis not present

## 2020-02-11 DIAGNOSIS — I2510 Atherosclerotic heart disease of native coronary artery without angina pectoris: Secondary | ICD-10-CM | POA: Diagnosis present

## 2020-02-11 DIAGNOSIS — I480 Paroxysmal atrial fibrillation: Secondary | ICD-10-CM | POA: Diagnosis present

## 2020-02-11 DIAGNOSIS — N1831 Chronic kidney disease, stage 3a: Secondary | ICD-10-CM | POA: Diagnosis present

## 2020-02-11 LAB — BASIC METABOLIC PANEL
Anion gap: 8 (ref 5–15)
BUN: 18 mg/dL (ref 8–23)
CO2: 25 mmol/L (ref 22–32)
Calcium: 9.6 mg/dL (ref 8.9–10.3)
Chloride: 109 mmol/L (ref 98–111)
Creatinine, Ser: 0.83 mg/dL (ref 0.44–1.00)
GFR, Estimated: >60 mL/min (ref >60)
Glucose, Bld: 109 mg/dL — ABNORMAL HIGH (ref 70–99)
Potassium: 3.9 mmol/L (ref 3.5–5.1)
Sodium: 142 mmol/L (ref 135–145)

## 2020-02-11 LAB — ECHOCARDIOGRAM COMPLETE
AR max vel: 2.23 cm2
AV Area VTI: 2.92 cm2
AV Area mean vel: 2.28 cm2
AV Mean grad: 3 mmHg
AV Peak grad: 6.4 mmHg
Ao pk vel: 1.26 m/s
Area-P 1/2: 2.24 cm2
Height: 67 in
MV VTI: 1.68 cm2
S' Lateral: 2.14 cm
Weight: 2080 oz

## 2020-02-11 LAB — CBC
HCT: 41.1 % (ref 36.0–46.0)
Hemoglobin: 13.2 g/dL (ref 12.0–15.0)
MCH: 27.6 pg (ref 26.0–34.0)
MCHC: 32.1 g/dL (ref 30.0–36.0)
MCV: 85.8 fL (ref 80.0–100.0)
Platelets: 250 10*3/uL (ref 150–400)
RBC: 4.79 MIL/uL (ref 3.87–5.11)
RDW: 13.2 % (ref 11.5–15.5)
WBC: 7.8 10*3/uL (ref 4.0–10.5)
nRBC: 0 % (ref 0.0–0.2)

## 2020-02-11 LAB — SARS CORONAVIRUS 2 (TAT 6-24 HRS): SARS Coronavirus 2: NEGATIVE

## 2020-02-11 LAB — HEMOGLOBIN A1C
Hgb A1c MFr Bld: 5.6 % (ref 4.8–5.6)
Mean Plasma Glucose: 114.02 mg/dL

## 2020-02-11 MED ORDER — MIRTAZAPINE 15 MG PO TABS
7.5000 mg | ORAL_TABLET | Freq: Every day | ORAL | Status: DC
  Administered 2020-02-11: 21:00:00 7.5 mg via ORAL
  Filled 2020-02-11: qty 1

## 2020-02-11 MED ORDER — DILTIAZEM HCL ER COATED BEADS 120 MG PO CP24
120.0000 mg | ORAL_CAPSULE | Freq: Every day | ORAL | Status: DC
  Administered 2020-02-11 – 2020-02-12 (×2): 120 mg via ORAL
  Filled 2020-02-11 (×2): qty 1

## 2020-02-11 MED ORDER — POTASSIUM CITRATE ER 10 MEQ (1080 MG) PO TBCR
10.0000 meq | EXTENDED_RELEASE_TABLET | Freq: Once | ORAL | Status: AC
  Administered 2020-02-11: 10 meq via ORAL
  Filled 2020-02-11: qty 1

## 2020-02-11 MED ORDER — DILTIAZEM HCL 30 MG PO TABS
30.0000 mg | ORAL_TABLET | Freq: Four times a day (QID) | ORAL | Status: DC
Start: 2020-02-11 — End: 2020-02-11
  Administered 2020-02-11 (×2): 30 mg via ORAL
  Filled 2020-02-11 (×2): qty 1

## 2020-02-11 NOTE — Progress Notes (Signed)
PROGRESS NOTE    Leslie Johnston  QJJ:[id number] DOB: Feb 02, 1925 DOA: 02/10/2020 PCP: Verl Bangs, FNP   CC: Palpitation Brief Narrative:  Leslie Johnston is a 87 y.o. female with medical history significant of hypertension, CKD 3, overactive bladder, osteoporosis who presents after having several hours of chest discomfort and palpitations.   In the emergency room, she was found to have atrial fibrillation with a heart rate between 110-170.  She was placed on diltiazem drip.  She was also started on Eliquis for anticoagulation. Her heart rhythm converted to sinus, then flipped back to A. Fib.  Finally converted to sinus this morning.   Assessment & Plan:   Principal Problem:   Atrial fibrillation with RVR (HCC) Active Problems:   Benign hypertension with CKD (chronic kidney disease) stage III   Postmenopausal osteoporosis with pathological fracture of humerus   OAB (overactive bladder)   Dysthymia  #1.  Paroxysmal atrial fibrillation with rapid ventricular response. Patient currently in sinus.  Due to recurrent atrial fibrillation in the short period time, I would like to keep patient in the hospital for another night.  She is currently on oral diltiazem, I changed to 120 mg daily dose.  Likely not the patient overnight, to make sure if she is converted to atrial fibrillation, heart rate would be controlled. Discussed with patient daughter, patient does not have frequent falls.  Eliquis will be continued.  Echocardiogram pending.  2.  essential hypertension. Continue lisinopril and diltiazem  #3.  Confusion. Possible early dementia. Condition stable   DVT prophylaxis: Eliquis Code Status: Full Family Communication: Daughter at bedside, updated. Disposition Plan:  .   Status is: Inpatient  Remains inpatient appropriate because:Inpatient level of care appropriate due to severity of illness   Dispo: The patient is from: Home              Anticipated d/c is to: Home               Anticipated d/c date is: 1 day              Patient currently is not medically stable to d/c.   Difficult to place patient No        No intake/output data recorded. No intake/output data recorded.     Consultants:   card  Procedures: None  Antimicrobials: None  Subjective: Patient doing better today.  No longer has any palpitation or chest discomfort. Denies any short of breath, no hypoxia. No abdominal pain or nausea vomiting pain No dysuria hematuria No fever or chills.  Objective: Vitals:   02/11/20 0900 02/11/20 0930 02/11/20 1107 02/11/20 1142  BP: 127/66 111/80 (!) 151/79 129/82  Pulse: 69 60 76 75  Resp: (!) 21 19 18 20   Temp:   97.6 F (36.4 C) (!) 97.5 F (36.4 C)  TempSrc:   Oral Oral  SpO2: 94% 91% 95% 95%  Weight:      Height:       No intake or output data in the 24 hours ending 02/11/20 1338 Filed Weights   02/10/20 2019  Weight: 59 kg    Examination:  General exam: Appears calm and comfortable  Respiratory system: Clear to auscultation. Respiratory effort normal. Cardiovascular system: S1 & S2 heard, RRR. No JVD, murmurs, rubs, gallops or clicks. No pedal edema. Gastrointestinal system: Abdomen is nondistended, soft and nontender. No organomegaly or masses felt. Normal bowel sounds heard. Central nervous system: Alert and oriented x2. No focal neurological deficits. Extremities:  Symmetric 5 x 5 power. Skin: No rashes, lesions or ulcers Psychiatry: Judgement and insight appear normal. Mood & affect appropriate.     Data Reviewed: I have personally reviewed following labs and imaging studies  CBC: Recent Labs  Lab 02/10/20 2028 02/11/20 0500  WBC 8.7 7.8  HGB 14.6 13.2  HCT 46.4* 41.1  MCV 87.2 85.8  PLT 260 465   Basic Metabolic Panel: Recent Labs  Lab 02/10/20 2028 02/11/20 0500  NA 140 142  K 4.3 3.9  CL 107 109  CO2 23 25  GLUCOSE 122* 109*  BUN 21 18  CREATININE 0.94 0.83  CALCIUM 10.0 9.6  MG 2.4   --    GFR: Estimated Creatinine Clearance: 40.3 mL/min (by C-G formula based on SCr of 0.83 mg/dL). Liver Function Tests: No results for input(s): AST, ALT, ALKPHOS, BILITOT, PROT, ALBUMIN in the last 168 hours. No results for input(s): LIPASE, AMYLASE in the last 168 hours. No results for input(s): AMMONIA in the last 168 hours. Coagulation Profile: No results for input(s): INR, PROTIME in the last 168 hours. Cardiac Enzymes: No results for input(s): CKTOTAL, CKMB, CKMBINDEX, TROPONINI in the last 168 hours. BNP (last 3 results) No results for input(s): PROBNP in the last 8760 hours. HbA1C: No results for input(s): HGBA1C in the last 72 hours. CBG: No results for input(s): GLUCAP in the last 168 hours. Lipid Profile: No results for input(s): CHOL, HDL, LDLCALC, TRIG, CHOLHDL, LDLDIRECT in the last 72 hours. Thyroid Function Tests: Recent Labs    02/10/20 2118  TSH 0.797   Anemia Panel: No results for input(s): VITAMINB12, FOLATE, FERRITIN, TIBC, IRON, RETICCTPCT in the last 72 hours. Sepsis Labs: No results for input(s): PROCALCITON, LATICACIDVEN in the last 168 hours.  No results found for this or any previous visit (from the past 240 hour(s)).       Radiology Studies: DG Chest 1 View  Result Date: 02/10/2020 CLINICAL DATA:  Chest pain and burning today. EXAM: CHEST  1 VIEW COMPARISON:  12/26/2016 FINDINGS: Heart size is normal. Thoracic aortic atherosclerosis and tortuosity. Hiatal hernia noted in the lower mediastinum. Chronic abnormal interstitial lung markings with pleural and parenchymal scarring at the apices. No evidence of edema or effusion. No infiltrate or collapse. Old augmented lower thoracic fracture. Few partial compression fractures in the thoracic spine above that, age indeterminate. IMPRESSION: No active disease. Hiatal hernia. Thoracic aortic atherosclerosis. Chronic interstitial lung markings. Few partial compression fractures in the thoracic spine above  that, age indeterminate. Electronically Signed   By: Nelson Chimes M.D.   On: 02/10/2020 20:42        Scheduled Meds: . apixaban  2.5 mg Oral BID  . aspirin EC  81 mg Oral Q lunch  . diltiazem  120 mg Oral Daily  . lisinopril  20 mg Oral Daily  . mirabegron ER  25 mg Oral Daily  . multivitamin with minerals  1 tablet Oral Q lunch  . [place] Calcium/D   Oral Q lunch   Continuous Infusions:   LOS: 0 days    Time spent: 28 minutes    Sharen Hones, MD [facility]   To contact the attending provider between 7A-7P or the covering provider during after hours 7P-7A, please log into the web site [email or web address] and access using universal [place] password for that web site. If you do not have the password, please call the hospital operator.  02/11/2020, 1:38 PM

## 2020-02-11 NOTE — ED Notes (Addendum)
Pt moved to room #38 from bed 5.  Sitting up  On side of bed, eating breakfast.   Family at bedside.

## 2020-02-11 NOTE — Progress Notes (Signed)
Cross Cover BP stable off cardizem drip and rate remains control with oral cardizem on board Orders for medsurg bed with tele placed

## 2020-02-11 NOTE — Progress Notes (Signed)
*  PRELIMINARY RESULTS* Echocardiogram 2D Echocardiogram has been performed.  Leslie Johnston 02/11/2020, 9:54 AM

## 2020-02-11 NOTE — Discharge Instructions (Signed)
Information on my medicine - ELIQUIS (apixaban) This medication education was reviewed with me or my healthcare representative as part of my discharge preparation. The pharmacist that spoke with me during my hospital stay was: ____________________________ (pharmacist name) WHY WAS [place]? Eliquis was prescribed for you to reduce the risk of a blood clot forming that can cause a stroke if you have a medical condition called atrial fibrillation (a type of irregular heartbeat). WHAT DO YOU NEED TO KNOW ABOUT ELIQUIS ? Take your Eliquis TWICE DAILY - one tablet in the morning and one tablet in the evening with or without food. If you have difficulty swallowing the tablet whole please discuss with your pharmacist how to take the medication safely. Take Eliquis exactly as prescribed by your doctor and DO NOT stop taking Eliquis without talking to the doctor who prescribed the medication. Stopping may increase your risk of developing a stroke. Refill your prescription before you run out. After discharge, you should have regular check-up appointments with your healthcare provider that is prescribing your Eliquis. In the future your dose may need to be changed if your kidney function or weight changes by a significant amount or as you get older. WHAT DO YOU DO IF YOU MISS A DOSE? If you miss a dose, take it as soon as you remember on the same day and resume taking twice daily. Do not take more than one dose of ELIQUIS at the same time to make up a missed dose. IMPORTANT SAFETY INFORMATION A possible side effect of Eliquis is bleeding. You should call your healthcare provider right away if you experience any of the following: ? Bleeding from an injury or your nose that does not stop. ? Unusual colored urine (red or dark brown) or unusual colored stools (red or black). ? Unusual bruising for unknown reasons. ? A serious fall or if you hit your head (even if there is no  bleeding). Some medicines may interact with Eliquis and might increase your risk of bleeding or clotting while on Eliquis. To help avoid this, consult your healthcare provider or pharmacist prior to using any new prescription or non-prescription medications, including herbals, vitamins, non-steroidal anti-inflammatory drugs (NSAIDs) and supplements. This website has more information on Eliquis (apixaban): [email or web address].

## 2020-02-11 NOTE — Progress Notes (Signed)
OR patient requesting prayer. Spoke with daughter, she is hopeful that her mother will m Jimmye Norman it out and back home soon.

## 2020-02-11 NOTE — ED Notes (Signed)
NP Randol Kern notified of pt BP 88/70 while on diltiazem infusion with rate of 15mg /HR at this time

## 2020-02-11 NOTE — ED Notes (Addendum)
Messaged next shift RN, Di Kindle at [phone]

## 2020-02-11 NOTE — ED Notes (Signed)
This RN and Janyth Contes, RN at bedside. Pt HR currently 120s in afib rhythm. Pt repositioned in bed and brief placed on pt. Pt HR now maintaining in 70s in NSR.

## 2020-02-11 NOTE — ED Notes (Signed)
Pt assisted to the toilet at this time.  

## 2020-02-11 NOTE — Progress Notes (Signed)
Cardiology Consultation:   Patient ID: Leslie Johnston MRN: [id number]; DOB: 23-May-1925  Admit date: 02/10/2020 Date of Consult: 02/11/2020  Primary Care Provider: Verl Bangs, FNP [facility] Cardiologist: [place] Electrophysiologist:  None    Patient Profile:   Leslie Johnston is a 87 y.o. female with a hx of HTN, overactive bladder, osteoporosis,  and dementia who is being seen today for the evaluation of new onset afib at the request of Dr. Roosevelt Locks.  History of Present Illness:   [name] has not been seen by cardiology in the past. She is accompanied by her daughter who helps with the history. No known CAD history. She does not regularly see her PCP since the patient is rarely sick. No history of diabetes, HLD, stroke, thyroid disease, or heart failure. No known cardiac disease in her family. No tobacco, alcohol, or drug history. The patient lives by herself but always has someone there with her at night. She uses a cane to get around and is fairly functional for her age. She drinks about 4 cups of coffee daily. Of note chest CT in 2015 showed 2V coronary atherosclerosis in the Lcx and RCA.     The patient presented to the ED 02/10/20 for chest pain, abdominal discomfort and foot pain. About 2-3 days ago the patient started experiencing foot numbness and tingling as well as lower abdominal discomfort. She was eating and drinking normally. No nausea, vomiting, diarrhea, fever, or chills. Pain was coming and going. And then last night around 7ppm she noted some chest pain. It was a pressure in the center of the chest and non-radiating. It felt similar to acid-reflux. No associated sob, N/V. She was maybe a little diaphoretic at times. Denies palpitations, lightheadedness, dizziness, lower leg edema, orthopnea, or pnd. Chest pain was coming and going and patient eventually was taken to the ED for evaluation.   In th ED BP 140/98, RR 18, 98%O2, afebrile. EKG showed Afib with  rates up to 170s. Labs showed potassium 4.3, magnesium 2.4, glucose122, creatinine 0.94, BUN 21, WBC 8.1, Hgb 14.6. HS troponin 12>!5. COVID pending. CXR with no acute illness. Patient was given dilt bolus and infusion and admitted for further work-up.    Patient was seen in the ED. Tele monitoring shows patient is in NSR with heart rates in the 60-70s. Unable to tell when she converted since patient switched rooms. On interview she is resting comfortable and denies any chest pain.    Past Medical History:  Diagnosis Date  . Allergy   . Hypertension     Past Surgical History:  Procedure Laterality Date  . EYE SURGERY     cataract extraction- Right  . KYPHOPLASTY N/A 11/26/2017   Procedure: [id number],Q0;  Surgeon: Hessie Knows, MD;  Location: [facility] ORS;  Service: Orthopedics;  Laterality: N/A;     Home Medications:  Prior to Admission medications   Medication Sig Start Date End Date Taking? Authorizing Provider  aspirin 81 MG tablet Take 81 mg by mouth daily with lunch.    Yes [provider]  Calcium Carb-Cholecalciferol (CALCIUM 600 + D PO) Take 1 tablet by mouth daily with lunch.   Yes [provider]  lisinopril (ZESTRIL) 20 MG tablet Take 1 tablet (20 mg total) by mouth daily. 04/15/19  Yes Karamalegos, Devonne Doughty, DO  mirtazapine (REMERON) 7.5 MG tablet Take 1 tablet (7.5 mg total) by mouth at bedtime. 04/15/19  Yes Karamalegos, Devonne Doughty, DO  Multiple Vitamin (MULTIVITAMIN WITH  MINERALS) TABS tablet Take 1 tablet by mouth daily with lunch.    Yes [provider]  mirabegron ER (MYRBETRIQ) 25 MG TB24 tablet Take 1 tablet (25 mg total) by mouth daily. Patient not taking: Reported on 02/10/2020 04/15/19   Olin Hauser, DO  oxybutynin (DITROPAN) 5 MG tablet TAKE 1/2 TABLET BY MOUTH THREE TIMES DAILY Patient not taking: No sig reported 01/19/19   Olin Hauser, DO    Inpatient Medications: Scheduled Meds: . apixaban  2.5 mg Oral  BID  . aspirin EC  81 mg Oral Q lunch  . diltiazem  30 mg Oral Q6H  . lisinopril  20 mg Oral Daily  . mirabegron ER  25 mg Oral Daily  . multivitamin with minerals  1 tablet Oral Q lunch  . Oyster Shell Calcium/D   Oral Q lunch   Continuous Infusions:  PRN Meds: acetaminophen  Allergies:    Allergies  Allergen Reactions  . Amlodipine Swelling  . Losartan Itching  . Penicillins Hives and Other (See Comments)    Has patient had a PCN reaction causing immediate rash, facial/tongue/throat swelling, SOB or lightheadedness with hypotension: No Has patient had a PCN reaction causing severe rash involving mucus membranes or skin necrosis: No Has patient had a PCN reaction that required hospitalization: No Has patient had a PCN reaction occurring within the last 10 years: No If all of the above answers are "NO", then may proceed with Cephalosporin use.   . Tramadol Other (See Comments)    Confusion    Social History:   Social History   Socioeconomic History  . Marital status: Widowed    Spouse name: Not on file  . Number of children: Not on file  . Years of education: Not on file  . Highest education level: Not on file  Occupational History  . Not on file  Tobacco Use  . Smoking status: Never Smoker  . Smokeless tobacco: Never Used  Vaping Use  . Vaping Use: Never used  Substance and Sexual Activity  . Alcohol use: No    Alcohol/week: 0.0 standard drinks  . Drug use: No  . Sexual activity: Not on file  Other Topics Concern  . Not on file  Social History Narrative  . Not on file   Social Determinants of Health   Financial Resource Strain: Not on file  Food Insecurity: Not on file  Transportation Needs: Not on file  Physical Activity: Not on file  Stress: Not on file  Social Connections: Not on file  Intimate Partner Violence: Not on file    Family History:    Family History  Problem Relation Age of Onset  . Pancreatic cancer Sister   . Arthritis Maternal  Grandmother   . Leukemia Maternal Grandfather   . Asthma Maternal Grandfather   . Breast cancer Daughter   . Bladder Cancer Daughter      ROS:  Please see the history of present illness.  All other ROS reviewed and negative.     Physical Exam/Data:   Vitals:   02/11/20 0716 02/11/20 0900 02/11/20 0930 02/11/20 1107  BP: 101/88 127/66 111/80 (!) 151/79  Pulse: 82 69 60 76  Resp:  (!) 21 19 18   Temp:    97.6 F (36.4 C)  TempSrc:      SpO2:  94% 91% 95%  Weight:      Height:       No intake or output data in the 24 hours ending 02/11/20  1110 Last 3 Weights 02/10/2020 04/15/2019 11/18/2017  Weight (lbs) 130 lb 132 lb 132 lb  Weight (kg) 58.968 kg 59.875 kg 59.875 kg     Body mass index is 20.36 kg/m.  General:  Frail elderly WF HEENT: normal Lymph: no adenopathy Neck: no JVD Endocrine:  No thryomegaly Vascular: No carotid bruits; FA pulses 2+ bilaterally without bruits  Cardiac:  normal S1, S2; RRR; no murmur  Lungs:  clear to auscultation bilaterally, no wheezing, rhonchi or rales  Abd: soft, nontender, no hepatomegaly  Ext: no edema Musculoskeletal:  No deformities, BUE and BLE strength normal and equal Skin: warm and dry  Neuro:  CNs 2-12 intact, no focal abnormalities noted Psych:  Normal affect   EKG:  The EKG was personally reviewed and demonstrates:  Afib RVR, 172bpm Telemetry:  Telemetry was personally reviewed and demonstrates:  NSR, Hr 60-70s,   Relevant CV Studies:  Echo pending  Laboratory Data:  High Sensitivity Troponin:   Recent Labs  Lab 02/10/20 2028 02/10/20 2200  TROPONINIHS 12 15     Chemistry Recent Labs  Lab 02/10/20 2028 02/11/20 0500  NA 140 142  K 4.3 3.9  CL 107 109  CO2 23 25  GLUCOSE 122* 109*  BUN 21 18  CREATININE 0.94 0.83  CALCIUM 10.0 9.6  GFRNONAA 57* >60  ANIONGAP 10 8    No results for input(s): PROT, ALBUMIN, AST, ALT, ALKPHOS, BILITOT in the last 168 hours. Hematology Recent Labs  Lab 02/10/20 2028  02/11/20 0500  WBC 8.7 7.8  RBC 5.32* 4.79  HGB 14.6 13.2  HCT 46.4* 41.1  MCV 87.2 85.8  MCH 27.4 27.6  MCHC 31.5 32.1  RDW 13.2 13.2  PLT 260 250   BNPNo results for input(s): BNP, PROBNP in the last 168 hours.  DDimer No results for input(s): DDIMER in the last 168 hours.   Radiology/Studies:  DG Chest 1 View  Result Date: 02/10/2020 CLINICAL DATA:  Chest pain and burning today. EXAM: CHEST  1 VIEW COMPARISON:  12/26/2016 FINDINGS: Heart size is normal. Thoracic aortic atherosclerosis and tortuosity. Hiatal hernia noted in the lower mediastinum. Chronic abnormal interstitial lung markings with pleural and parenchymal scarring at the apices. No evidence of edema or effusion. No infiltrate or collapse. Old augmented lower thoracic fracture. Few partial compression fractures in the thoracic spine above that, age indeterminate. IMPRESSION: No active disease. Hiatal hernia. Thoracic aortic atherosclerosis. Chronic interstitial lung markings. Few partial compression fractures in the thoracic spine above that, age indeterminate. Electronically Signed   By: Nelson Chimes M.D.   On: 02/10/2020 20:42     Assessment and Plan:   New onset afib - Patient presented with chest pain and palpitations found to be in Afib RVR and started on IV dilt. HS troponin negative x2.  - Patient is currently in SR with rates in the 60-70s - Transitioned to PO dilt 30mg  Q6H>>can consolidate - Goal K>4 and Mag>2.  - TSH wnl - echo pending - CHADSVASC (agex2, female, HTN) Started on Eliquis 2.5mg  BID (age, weight). She is also on aspirin. Consider stopping this given age and fall risk. - I will check an EKG. Further recs pending echo  HTN - lisinopril 20mg  daily - Dilt 30 mg Q6H>>can consolidate as above - BP stable  Mild Dementia - Patient is very functional at baseline and is by herself throughout the day. Someone is always with her at night.  - Daughter present to help with history - Del[place]ium  precautions  2V Coronary atherosclerosis by chest CT in 2015 - no prior ischemic eval - RF include HTN - will check Lipid panel and A1C - was on aspirin, however would d/c with eliquis - might benefit from a statin  Risk Assessment/Risk Scores:        CHA2DS2-VASc Score = 4  This indicates a 4.8% annual risk of stroke. The patient's score is based upon: CHF History: No HTN History: Yes Diabetes History: No Stroke History: No Vascular Disease History: No Age Score: 2 Gender Score: 1       For questions or updates, please contact [place] Please consult [email or web address] for contact info under    Signed, Desiraye Rolfson Ninfa Meeker, PA-C  02/11/2020 11:10 AM

## 2020-02-12 DIAGNOSIS — I4891 Unspecified atrial fibrillation: Secondary | ICD-10-CM | POA: Diagnosis not present

## 2020-02-12 DIAGNOSIS — N183 Chronic kidney disease, stage 3 unspecified: Secondary | ICD-10-CM | POA: Diagnosis not present

## 2020-02-12 DIAGNOSIS — I129 Hypertensive chronic kidney disease with stage 1 through stage 4 chronic kidney disease, or unspecified chronic kidney disease: Secondary | ICD-10-CM | POA: Diagnosis not present

## 2020-02-12 LAB — LIPID PANEL
Cholesterol: 142 mg/dL (ref 0–200)
HDL: 45 mg/dL (ref >40)
LDL Cholesterol: 81 mg/dL (ref 0–99)
Total CHOL/HDL Ratio: 3.2 RATIO
Triglycerides: 79 mg/dL (ref <150)
VLDL: 16 mg/dL (ref 0–40)

## 2020-02-12 MED ORDER — DILTIAZEM HCL ER COATED BEADS 120 MG PO CP24: 120.0000 mg | ORAL_CAPSULE | Freq: Every day | ORAL | 0 refills | Status: DC

## 2020-02-12 MED ORDER — APIXABAN 2.5 MG PO TABS: 2.5000 mg | ORAL_TABLET | Freq: Two times a day (BID) | ORAL | 0 refills | Status: DC

## 2020-02-12 NOTE — Discharge Summary (Addendum)
Physician Discharge Summary  Patient ID: Leslie Johnston MRN: [id number] DOB/AGE: [id number] 85 y.o.  Admit date: 02/10/2020 Discharge date: 02/12/2020  Admission Diagnoses:  Discharge Diagnoses:  Principal Problem:   Atrial fibrillation with RVR ([place]) Active Problems:   Benign hypertension with CKD (chronic kidney disease) stage III   Postmenopausal osteoporosis with pathological fracture of humerus   OAB (overactive bladder)   Dysthymia  Addendum: Chronic kidney disease stage IIIa. Discharged Condition: good  Hospital Course:  Leslie V Breweris a 87 y.o.femalewith medical history significant ofhypertension, CKD 3, overactive bladder, osteoporosis who presents after having several hours of chestdiscomfortand palpitations.  In the emergency room, she was found to have atrial fibrillation with a heart rate between 110-170.  She was placed on diltiazem drip.  She was also started on Eliquis for anticoagulation. Her heart rhythm converted to sinus, then flipped back to A. Fib.  Finally converted to sinus in the morning. Patient was monitored for another night due to 2 episodes of atrial fibrillation, there is no recurrence.  At this point, patient be treated with 120 mg of diltiazem to prevent heart rate go up when she developed atrial fibrillation. Continued on her anticoagulation with Eliquis at 2.5 mg twice a day.  I have discussed with patient daughter, patient does not have frequent falls, last fall was 2 years ago.   Consults: cardiology  Significant Diagnostic Studies:  1. Left ventricular ejection fraction, by estimation, is 60 to 65%. The left ventricle has normal function. The left ventricle has no regional wall motion abnormalities. There is moderate left ventricular hypertrophy. Near cavity obliteration in systole. Left ventricular diastolic parameters are consistent with Grade I diastolic dysfunction (impaired relaxation). 2. Right ventricular systolic function is  normal. The right ventricular size is normal. There is normal pulmonary artery systolic pressure. The estimated right ventricular systolic pressure is 28.4 mmHg.   Treatments: Diltiazem drip  Discharge Exam: Blood pressure 140/77, pulse 78, temperature 98.6 F (37 C), resp. rate 20, height 5\' 7"  (1.702 m), weight 59 kg, SpO2 96 %. General appearance: alert and cooperative Resp: clear to auscultation bilaterally Cardio: regular rate and rhythm, S1, S2 normal, no murmur, click, rub or gallop GI: soft, non-tender; bowel sounds normal; no masses,  no organomegaly Extremities: extremities normal, atraumatic, no cyanosis or edema  Disposition: Discharge disposition: 01-Home or Self Care       Discharge Instructions    Amb referral to AFIB Clinic   Complete by: As directed    Diet - low sodium heart healthy   Complete by: As directed    Increase activity slowly   Complete by: As directed      Allergies as of 02/12/2020      Reactions   Amlodipine Swelling   Losartan Itching   Penicillins Hives, Other (See Comments)   Has patient had a PCN reaction causing immediate rash, facial/tongue/throat swelling, SOB or lightheadedness with hypotension: No Has patient had a PCN reaction causing severe rash involving mucus membranes or skin necrosis: No Has patient had a PCN reaction that required hospitalization: No Has patient had a PCN reaction occurring within the last 10 years: No If all of the above answers are "NO", then may proceed with Cephalosporin use.   Tramadol Other (See Comments)   Confusion      Medication List    STOP taking these medications   aspirin 81 MG tablet     TAKE these medications   apixaban 2.5 MG Tabs tablet Commonly known as:  ELIQUIS Take 1 tablet (2.5 mg total) by mouth 2 (two) times daily.   CALCIUM 600 + D PO Take 1 tablet by mouth daily with lunch.   diltiazem 120 MG 24 hr capsule Commonly known as: CARDIZEM CD Take 1 capsule (120 mg total)  by mouth daily.   lisinopril 20 MG tablet Commonly known as: ZESTRIL Take 1 tablet (20 mg total) by mouth daily.   mirabegron ER 25 MG Tb24 tablet Commonly known as: Myrbetriq Take 1 tablet (25 mg total) by mouth daily.   mirtazapine 7.5 MG tablet Commonly known as: REMERON Take 1 tablet (7.5 mg total) by mouth at bedtime.   multivitamin with minerals Tabs tablet Take 1 tablet by mouth daily with lunch.   oxybutynin 5 MG tablet Commonly known as: DITROPAN TAKE 1/2 TABLET BY MOUTH THREE TIMES DAILY       Follow-up Information    Lorine Bears Lupita Raider, FNP Follow up.   Specialty: Family Medicine Contact information: [place] [address] [phone]        Minna Merritts, MD Follow up in 2 week(s).   Specialty: Cardiology Contact information: [place] [address] [phone]               Signed: Sharen Hones 02/12/2020, 8:49 AM

## 2020-02-15 ENCOUNTER — Telehealth: Payer: Self-pay

## 2020-02-15 NOTE — Telephone Encounter (Signed)
Transition Care Management Unsuccessful Follow-up Telephone Call  Date of discharge and from where:  02/12/2020 [facility]  Attempts:  1st Attempt  Reason for unsuccessful TCM follow-up call:  Left voice message

## 2020-02-23 ENCOUNTER — Other Ambulatory Visit: Payer: Self-pay

## 2020-02-23 ENCOUNTER — Ambulatory Visit (INDEPENDENT_AMBULATORY_CARE_PROVIDER_SITE_OTHER): Payer: Medicare Other | Admitting: Medical

## 2020-02-23 ENCOUNTER — Encounter: Payer: Self-pay | Admitting: Medical

## 2020-02-23 VITALS — BP 138/86 | HR 77 | Ht 67.0 in | Wt 132.0 lb

## 2020-02-23 DIAGNOSIS — I480 Paroxysmal atrial fibrillation: Secondary | ICD-10-CM

## 2020-02-23 DIAGNOSIS — I10 Essential (primary) hypertension: Secondary | ICD-10-CM | POA: Diagnosis not present

## 2020-02-23 DIAGNOSIS — N183 Chronic kidney disease, stage 3 unspecified: Secondary | ICD-10-CM | POA: Diagnosis not present

## 2020-02-23 MED ORDER — APIXABAN 2.5 MG PO TABS: 2.5000 mg | ORAL_TABLET | Freq: Two times a day (BID) | ORAL | 3 refills | Status: DC

## 2020-02-23 MED ORDER — DILTIAZEM HCL ER COATED BEADS 120 MG PO CP24: 120.0000 mg | ORAL_CAPSULE | Freq: Every day | ORAL | 3 refills | Status: DC

## 2020-02-23 NOTE — Progress Notes (Addendum)
Cardiology Office Note:    Date:  02/23/2020   ID:  Leslie Johnston, DOB April 13, 1925, MRN [id number]  PCP:  Verl Bangs, FNP  [facility] Cardiologist:  [place] Electrophysiologist:  None   Referring MD: Verl Bangs, FNP   Chief Complaint: New onset Afib  History of Present Illness:    Leslie Johnston is a 87 y.o. female with a hx of HTN, overactive bladder, osteoporosis, dementia, and recently diagnosed Afib who presents for follow-up for afib.   The patient was recently admitted 2/9-2/11 with chest pain and abdominal pain. She was found to be in new onset Afib with rates up into the 170s. Labs were unremarkable. Patient converted to SR with IV diltiazem. Echo showed LVEF 60-65%, no WMA, G1DD, normal RV function. Diltiazem was consolidated to 120mg  daily. She was started on Eliquis 2.5mg  BID (age and weight) and discharged.   Today, she is accompanied by her daughter. She reports patient is still complaining of foot discomfort and abdominal pain, however this is improved since the ER visit. I encouraged she follow-up with PCP if this continues.They have been checking vitals at home. Heart rates are generally in the 80s. Bps have been 130s/80s. No chest pain. Might have occasional shortness of breath. No lower leg edema, orthopnea, fever, chills, headaches, lightheadedness, or dizziness. Patient is tolerating Eliquis, no bleeding issues. Patient is also tolerating diltiazem.   Past Medical History:  Diagnosis Date  . Allergy   . Hypertension     Past Surgical History:  Procedure Laterality Date  . EYE SURGERY     cataract extraction- Right  . KYPHOPLASTY N/A 11/26/2017   Procedure: [id number];  Surgeon: Hessie Knows, MD;  Location: [facility] ORS;  Service: Orthopedics;  Laterality: N/A;    Current Medications: No outpatient medications have been marked as taking for the 02/23/20 encounter (Appointment) with Kathlen Mody, Tarissa Kerin H, PA-C.     Allergies:    Amlodipine, Losartan, Penicillins, and Tramadol   Social History   Socioeconomic History  . Marital status: Widowed    Spouse name: Not on file  . Number of children: Not on file  . Years of education: Not on file  . Highest education level: Not on file  Occupational History  . Not on file  Tobacco Use  . Smoking status: Never Smoker  . Smokeless tobacco: Never Used  Vaping Use  . Vaping Use: Never used  Substance and Sexual Activity  . Alcohol use: No    Alcohol/week: 0.0 standard drinks  . Drug use: No  . Sexual activity: Not on file  Other Topics Concern  . Not on file  Social History Narrative  . Not on file   Social Determinants of Health   Financial Resource Strain: Not on file  Food Insecurity: Not on file  Transportation Needs: Not on file  Physical Activity: Not on file  Stress: Not on file  Social Connections: Not on file     Family History: The patient's *family history includes Arthritis in her maternal grandmother; Asthma in her maternal grandfather; Bladder Cancer in her daughter; Breast cancer in her daughter; Leukemia in her maternal grandfather; Pancreatic cancer in her sister.  ROS:   Please see the history of present illness.    All other systems reviewed and are negative.  EKGs/Labs/Other Studies Reviewed:    The following studies were reviewed today:  Echo 02/11/20 1. Left ventricular ejection fraction, by estimation, is 60 to 65%. The  left ventricle has  normal function. The left ventricle has no regional  wall motion abnormalities. There is moderate left ventricular hypertrophy.  Near cavity obliteration in  systole. Left ventricular diastolic parameters are consistent with Grade I  diastolic dysfunction (impaired relaxation).  2. Right ventricular systolic function is normal. The right ventricular  size is normal. There is normal pulmonary artery systolic pressure. The  estimated right ventricular systolic pressure is 94.8 mmHg.    EKG:  EKG is ordered today.  The ekg ordered today demonstrates SR, 77bpm, PACs, nonspecific T wave changes, q waves septal leads  Recent Labs: 04/23/2019: ALT 7 02/10/2020: Magnesium 2.4; TSH 0.797 02/11/2020: BUN 18; Creatinine, Ser 0.83; Hemoglobin 13.2; Platelets 250; Potassium 3.9; Sodium 142  Recent Lipid Panel    Component Value Date/Time   CHOL 142 02/12/2020 0627   TRIG 79 02/12/2020 0627   HDL 45 02/12/2020 0627   CHOLHDL 3.2 02/12/2020 0627   VLDL 16 02/12/2020 0627   LDLCALC 81 02/12/2020 0627      Physical Exam:    VS:  There were no vitals taken for this visit.    Wt Readings from Last 3 Encounters:  02/10/20 130 lb (59 kg)  04/15/19 132 lb (59.9 kg)  11/18/17 132 lb (59.9 kg)     GEN:  Well nourished, well developed in no acute distress HEENT: Normal NECK: No JVD; No carotid bruits LYMPHATICS: No lymphadenopathy CARDIAC: RRR, no murmurs, rubs, gallops RESPIRATORY:  Clear to auscultation without rales, wheezing or rhonchi  ABDOMEN: Soft, non-tender, non-distended MUSCULOSKELETAL:  No edema; No deformity  SKIN: Warm and dry NEUROLOGIC:  Alert and oriented x 3 PSYCHIATRIC:  Normal affect   ASSESSMENT:    1. Paroxysmal A-fib ([place])   2. Essential hypertension   3. Stage 3 chronic kidney disease, unspecified whether stage 3a or 3b CKD (HCC)    PLAN:    In order of problems listed above:  Recently diagnosed Afib Patient recently admitted with chest pain and abdominal pain found to be in Afib RVR. She converted to SR with IV dilt. CHADSVASC at least 5 (agex2, female, HTN, CAD) started on Eliquis 2.5mg  BID and diltiazem 120mg  daily for rate control. Echo showed normal LV function. She denies bleeding issues with Eliquis. She is tolerating diltiazem, BP and heart rate stable. Patient is in sinus rhythm today. Continue current medications, will send in refills today. CBC and BMET at 1 month follow-up. Recommended PCP follow-up for abdominal and foot issues.    CKD stage 3 BMET at follow-up.   HTN Diltiazem 120mg  and lisinopril 20 mg daily. BP today 138/86. Continue current medications.  2V Coronary atherosclerosis by chest CT in 2015 Troponin negative x 2 in the ER. No prior ischemic event or evaluation. No aspirin with Eliquis. LDL 80 and A1C 5.6. No anginal symptoms. Echo with preserved EF as above. No further work-up.  Disposition: Follow up in 1 month(s) with APP/MD   Shared Decision Making/Informed Consent        Signed, Chigozie Basaldua Ninfa Meeker, PA-C  02/23/2020 8:46 AM    [facility]

## 2020-02-23 NOTE — Patient Instructions (Signed)
Medication Instructions:  No changes  *If you need a refill on your cardiac medications before your next appointment, please call your pharmacy*   Lab Work: None  If you have labs (blood work) drawn today and your tests are completely normal, you will receive your results only by: Marland Kitchen MyChart Message (if you have MyChart) OR . A paper copy in the mail If you have any lab test that is abnormal or we need to change your treatment, we will call you to review the results.   Testing/Procedures: None   Follow-Up: At [facility], you and your health needs are our priority.  As part of our continuing mission to provide you with exceptional heart care, we have created designated Provider Care Teams.  These Care Teams include your primary Cardiologist (physician) and Advanced Practice Providers (APPs -  Physician Assistants and Nurse Practitioners) who all work together to provide you with the care you need, when you need it.   Your next appointment:   1 month(s)  The format for your next appointment:   In Person  Provider:   Ida Rogue, MD or Murray Hodgkins, NP

## 2020-02-26 ENCOUNTER — Telehealth: Payer: Self-pay | Admitting: Cardiovascular Disease

## 2020-02-26 DIAGNOSIS — R350 Frequency of micturition: Secondary | ICD-10-CM | POA: Diagnosis not present

## 2020-02-26 DIAGNOSIS — M545 Low back pain, unspecified: Secondary | ICD-10-CM | POA: Diagnosis not present

## 2020-02-26 DIAGNOSIS — R0781 Pleurodynia: Secondary | ICD-10-CM | POA: Diagnosis not present

## 2020-02-26 NOTE — Telephone Encounter (Signed)
Patient daughter calling  States that on AVS from [place] visit it states that patient has stage 3 chronic kidney diease  They have never been told this and would like to know if this is an error  Please call to discuss

## 2020-02-28 ENCOUNTER — Encounter: Payer: Self-pay | Admitting: Radiology

## 2020-02-28 ENCOUNTER — Emergency Department: Payer: Medicare Other

## 2020-02-28 ENCOUNTER — Emergency Department
Admission: EM | Admit: 2020-02-28 | Discharge: 2020-02-29 | Disposition: A | Discharge status: Home / Self Care | Payer: Medicare Other | Attending: Emergency Medicine | Admitting: Emergency Medicine

## 2020-02-28 ENCOUNTER — Other Ambulatory Visit: Payer: Self-pay

## 2020-02-28 DIAGNOSIS — M791 Myalgia, unspecified site: Secondary | ICD-10-CM | POA: Insufficient documentation

## 2020-02-28 DIAGNOSIS — Z7901 Long term (current) use of anticoagulants: Secondary | ICD-10-CM | POA: Diagnosis not present

## 2020-02-28 DIAGNOSIS — N183 Chronic kidney disease, stage 3 unspecified: Secondary | ICD-10-CM | POA: Insufficient documentation

## 2020-02-28 DIAGNOSIS — D259 Leiomyoma of uterus, unspecified: Secondary | ICD-10-CM | POA: Diagnosis not present

## 2020-02-28 DIAGNOSIS — R1011 Right upper quadrant pain: Secondary | ICD-10-CM | POA: Diagnosis not present

## 2020-02-28 DIAGNOSIS — I10 Essential (primary) hypertension: Secondary | ICD-10-CM | POA: Diagnosis not present

## 2020-02-28 DIAGNOSIS — K8020 Calculus of gallbladder without cholecystitis without obstruction: Secondary | ICD-10-CM | POA: Diagnosis not present

## 2020-02-28 DIAGNOSIS — I129 Hypertensive chronic kidney disease with stage 1 through stage 4 chronic kidney disease, or unspecified chronic kidney disease: Secondary | ICD-10-CM | POA: Diagnosis not present

## 2020-02-28 DIAGNOSIS — K449 Diaphragmatic hernia without obstruction or gangrene: Secondary | ICD-10-CM | POA: Insufficient documentation

## 2020-02-28 DIAGNOSIS — K8080 Other cholelithiasis without obstruction: Secondary | ICD-10-CM

## 2020-02-28 DIAGNOSIS — R109 Unspecified abdominal pain: Secondary | ICD-10-CM | POA: Diagnosis not present

## 2020-02-28 DIAGNOSIS — I878 Other specified disorders of veins: Secondary | ICD-10-CM | POA: Diagnosis not present

## 2020-02-28 DIAGNOSIS — Z79899 Other long term (current) drug therapy: Secondary | ICD-10-CM | POA: Diagnosis not present

## 2020-02-28 LAB — COMPREHENSIVE METABOLIC PANEL
ALT: 10 U/L (ref 0–44)
AST: 17 U/L (ref 15–41)
Albumin: 4.1 g/dL (ref 3.5–5.0)
Alkaline Phosphatase: 76 U/L (ref 38–126)
Anion gap: 7 (ref 5–15)
BUN: 16 mg/dL (ref 8–23)
CO2: 26 mmol/L (ref 22–32)
Calcium: 10 mg/dL (ref 8.9–10.3)
Chloride: 107 mmol/L (ref 98–111)
Creatinine, Ser: 0.81 mg/dL (ref 0.44–1.00)
GFR, Estimated: >60 mL/min (ref >60)
Glucose, Bld: 111 mg/dL — ABNORMAL HIGH (ref 70–99)
Potassium: 4.4 mmol/L (ref 3.5–5.1)
Sodium: 140 mmol/L (ref 135–145)
Total Bilirubin: 0.6 mg/dL (ref 0.3–1.2)
Total Protein: 7.5 g/dL (ref 6.5–8.1)

## 2020-02-28 LAB — LIPASE, BLOOD: Lipase: 35 U/L (ref 11–51)

## 2020-02-28 LAB — CBC
HCT: 43.6 % (ref 36.0–46.0)
Hemoglobin: 13.6 g/dL (ref 12.0–15.0)
MCH: 27.5 pg (ref 26.0–34.0)
MCHC: 31.2 g/dL (ref 30.0–36.0)
MCV: 88.3 fL (ref 80.0–100.0)
Platelets: 249 10*3/uL (ref 150–400)
RBC: 4.94 MIL/uL (ref 3.87–5.11)
RDW: 13.1 % (ref 11.5–15.5)
WBC: 7.1 10*3/uL (ref 4.0–10.5)
nRBC: 0 % (ref 0.0–0.2)

## 2020-02-28 LAB — URINALYSIS, COMPLETE (UACMP) WITH MICROSCOPIC
Bacteria, UA: NONE SEEN
Bilirubin Urine: NEGATIVE
Glucose, UA: NEGATIVE mg/dL
Hgb urine dipstick: NEGATIVE
Ketones, ur: NEGATIVE mg/dL
Leukocytes,Ua: NEGATIVE
Nitrite: NEGATIVE
Protein, ur: NEGATIVE mg/dL
Specific Gravity, Urine: 1.002 — ABNORMAL LOW (ref 1.005–1.030)
pH: 7 (ref 5.0–8.0)

## 2020-02-28 LAB — TROPONIN I (HIGH SENSITIVITY)
Troponin I (High Sensitivity): 8 ng/L (ref <18)
Troponin I (High Sensitivity): 9 ng/L (ref <18)

## 2020-02-28 MED ORDER — PANTOPRAZOLE SODIUM 20 MG PO TBEC: 20.0000 mg | DELAYED_RELEASE_TABLET | Freq: Every day | ORAL | 0 refills | Status: DC

## 2020-02-28 MED ORDER — IOHEXOL 300 MG/ML  SOLN
100.0000 mL | Freq: Once | INTRAMUSCULAR | Status: AC | PRN
  Administered 2020-02-28: 100 mL via INTRAVENOUS

## 2020-02-28 NOTE — ED Triage Notes (Signed)
Pt to the ER with daughter. As per daughter, 2 nights ago pt had back pain and abdominal pain and pt was taken to urgent care, diagnosed with muscle spasms and sent home with medications. Daughter states medications are not helping and pt will grab her abdomen and yelp in pain. Pt is pointing to the RUQ of the abdomen.

## 2020-02-28 NOTE — Discharge Instructions (Addendum)
Her CT scan shows a hiatal hernia and gallstones.  We will start her on an acid reducer in case this is related to reflux but it seems more musculoskeletal in nature.  Take Tylenol 1 g every 8 hours and the medication prescribed by the urgent care.  Follow-up with her primary care doctor if her symptoms are not getting any better or return to the ER if they get worse including fevers, worsening pain or any other concerns.  I have given you a surgery number that you can follow-up with for her gallstone but given her age I do not think they will do surgery unless there for sure this is what is causing her pain   IMPRESSION: 1. Moderate volume partially visualized hiatal hernia. 2. Cholelithiasis with no CT findings of acute cholecystitis or choledocholithiasis. 3. Diffuse sigmoid diverticulosis with no acute diverticulitis. 4. Uterine fibroid. 5. Chronic appearing vertebral body height loss of the visualized lower thoracic spine and the lumbar spine status post T12 and L1 kyphoplasty. 6.  Aortic Atherosclerosis (ICD10-I70.0).

## 2020-02-28 NOTE — ED Provider Notes (Signed)
[facility] Emergency Department Provider Note  ____________________________________________   Event Date/Time   First MD Initiated Contact with Patient 02/28/20 1909     (approximate)  I have reviewed the triage vital signs and the nursing notes.   HISTORY  Chief Complaint Abdominal Pain    HPI Leslie Johnston is a 87 y.o. female with hypertension who comes in for abdominal pain.  Patient was diagnosed with muscle spasm 2 days ago at urgent care when she went in for back pain/abdominal pain.  However patient continued to have severe pain.  The pain comes on intermittently.  Mostly with her trying to get up and move.  Is mostly on the right side of her abdomen today but according to the daughter she stated that the pain was on the left side 2 days ago.  Patient is on a muscle relaxant prescribed by urgent care.  No nausea, vomiting, urinary symptoms.  No chest pain or shortness of breath.  Patient has been on medications for newly diagnosed A. fib.  Heart rates have been well controlled.          Past Medical History:  Diagnosis Date  . Allergy   . Hypertension     Patient Active Problem List   Diagnosis Date Noted  . Atrial fibrillation with RVR ([place]) 02/10/2020  . OAB (overactive bladder) 04/15/2019  . Dysthymia 04/15/2019  . Postmenopausal osteoporosis with pathological fracture of humerus 09/13/2016  . Humerus fracture 08/29/2016  . Chronic seasonal allergic rhinitis 03/20/2016  . Abnormal chest x-ray 09/19/2015  . Benign hypertension with CKD (chronic kidney disease) stage III 07/27/2014    Past Surgical History:  Procedure Laterality Date  . EYE SURGERY     cataract extraction- Right  . KYPHOPLASTY N/A 11/26/2017   Procedure: [id number],G2;  Surgeon: Hessie Knows, MD;  Location: [facility] ORS;  Service: Orthopedics;  Laterality: N/A;    Prior to Admission medications   Medication Sig Start Date End Date Taking? Authorizing  Provider  apixaban (ELIQUIS) 2.5 MG TABS tablet Take 1 tablet (2.5 mg total) by mouth 2 (two) times daily. 02/23/20   Furth, Cadence H, PA-C  Calcium Carb-Cholecalciferol (CALCIUM 600 + D PO) Take 1 tablet by mouth daily with lunch.    [provider]  diltiazem (CARDIZEM CD) 120 MG 24 hr capsule Take 1 capsule (120 mg total) by mouth daily. 02/23/20   Furth, Cadence H, PA-C  lisinopril (ZESTRIL) 20 MG tablet Take 1 tablet (20 mg total) by mouth daily. 04/15/19   Karamalegos, Devonne Doughty, DO  mirabegron ER (MYRBETRIQ) 25 MG TB24 tablet Take 1 tablet (25 mg total) by mouth daily. 04/15/19   Karamalegos, Devonne Doughty, DO  Multiple Vitamin (MULTIVITAMIN WITH MINERALS) TABS tablet Take 1 tablet by mouth daily with lunch.     [provider]    Allergies Amlodipine, Losartan, Penicillins, and Tramadol  Family History  Problem Relation Age of Onset  . Pancreatic cancer Sister   . Arthritis Maternal Grandmother   . Leukemia Maternal Grandfather   . Asthma Maternal Grandfather   . Breast cancer Daughter   . Bladder Cancer Daughter     Social History Social History   Tobacco Use  . Smoking status: Never Smoker  . Smokeless tobacco: Never Used  Vaping Use  . Vaping Use: Never used  Substance Use Topics  . Alcohol use: No    Alcohol/week: 0.0 standard drinks  . Drug use: No      Review of  Systems Constitutional: No fever/chills Eyes: No visual changes. ENT: No sore throat. Cardiovascular: Denies chest pain. Respiratory: Denies shortness of breath. Gastrointestinal: Positive abdominal pain.  No nausea, no vomiting.  No diarrhea.  No constipation. Genitourinary: Negative for dysuria. Musculoskeletal: Positive back pain Skin: Negative for rash. Neurological: Negative for headaches, focal weakness or numbness. All other ROS negative ____________________________________________   PHYSICAL EXAM:  VITAL SIGNS: ED Triage Vitals  Enc Vitals Group     BP 02/28/20  1842 (!) 151/87     Pulse Rate 02/28/20 1842 82     Resp 02/28/20 1842 18     Temp 02/28/20 1842 98.2 F (36.8 C)     Temp src --      SpO2 02/28/20 1842 98 %     Weight 02/28/20 1843 131 lb (59.4 kg)     Height 02/28/20 1843 5\' 7"  (1.702 m)     Head Circumference --      Peak Flow --      Pain Score --      Pain Loc --      Pain Edu? --      Excl. in [place]? --     Constitutional: Alert and oriented. Well appearing and in no acute distress. Eyes: Conjunctivae are normal. EOMI. Head: Atraumatic. Nose: No congestion/rhinnorhea. Mouth/Throat: Mucous membranes are moist.   Neck: No stridor. Trachea Midline. FROM Cardiovascular: Normal rate, regular rhythm. Grossly normal heart sounds.  Good peripheral circulation. Respiratory: Normal respiratory effort.  No retractions. Lungs CTAB. Gastrointestinal: Some tenderness in the right side of her abdomen and her right flank worse with trying to sit up and with movement.  No rash Musculoskeletal: No lower extremity tenderness nor edema.  No joint effusions. Neurologic:  Normal speech and language. No gross focal neurologic deficits are appreciated.  Skin:  Skin is warm, dry and intact. No rash noted. Psychiatric: Mood and affect are normal. Speech and behavior are normal. GU: Deferred   ____________________________________________   LABS (all labs ordered are listed, but only abnormal results are displayed)  Labs Reviewed  COMPREHENSIVE METABOLIC PANEL - Abnormal; Notable for the following components:      Result Value   Glucose, Bld 111 (*)    All other components within normal limits  URINALYSIS, COMPLETE (UACMP) WITH MICROSCOPIC - Abnormal; Notable for the following components:   Color, Urine STRAW (*)    APPearance CLEAR (*)    Specific Gravity, Urine 1.002 (*)    All other components within normal limits  LIPASE, BLOOD  CBC  TROPONIN I (HIGH SENSITIVITY)  TROPONIN I (HIGH SENSITIVITY)    ____________________________________________   ED ECG REPORT I, Vanessa [place], the attending physician, personally viewed and interpreted this ECG.  Atrial fibrillation rate of 86, no ST elevation, no T wave inversions, normal intervals ____________________________________________  RADIOLOGY   Official radiology report(s): CT ABDOMEN PELVIS W CONTRAST  Result Date: 02/28/2020 CLINICAL DATA:  Epigastric pain. EXAM: CT ABDOMEN AND PELVIS WITH CONTRAST TECHNIQUE: Multidetector CT imaging of the abdomen and pelvis was performed using the standard protocol following bolus administration of intravenous contrast. CONTRAST:  189mL OMNIPAQUE IOHEXOL 300 MG/ML  SOLN COMPARISON:  None. FINDINGS: Lower chest: Moderate volume partially visualized hiatal hernia. Otherwise no acute abnormality. Hepatobiliary: No focal liver abnormality. Calcified gallstones within the gallbladder lumen. No gallbladder wall thickening, or pericholecystic fluid. No biliary dilatation. Pancreas: No focal lesion. Normal pancreatic contour. No surrounding inflammatory changes. No main pancreatic ductal dilatation. Spleen: Normal in size without focal  abnormality. Adrenals/Urinary Tract: No adrenal nodule bilaterally. Bilateral kidneys enhance symmetrically. Fluid density lesions likely represent simple renal cysts. Subcentimeter hypodensities are too small to characterize. No hydronephrosis. No hydroureter. The urinary bladder is unremarkable. On delayed imaging, there is no urothelial wall thickening and there are no filling defects in the opacified portions of the bilateral collecting systems or ureters. Stomach/Bowel: Stomach is within normal limits. No evidence of bowel wall thickening or dilatation. Diffuse sigmoid diverticulosis. The appendix is not definitely identified. Vascular/Lymphatic: Several phleboliths noted within the pelvis. No abdominal aorta or iliac aneurysm. Mild to moderate calcified and noncalcified  atherosclerotic plaque and tortuosity of the aorta and its branches. No abdominal, pelvic, or inguinal lymphadenopathy. Reproductive: Coarsely pericentimeter calcified lesion within the posterior wall of the uterus likely represents a degenerative fibroid. Otherwise the uterus and bilateral adnexal regions are unremarkable. Other: No intraperitoneal free fluid. No intraperitoneal free gas. No organized fluid collection. Musculoskeletal: No abdominal wall hernia or abnormality Diffusely decreased bone density. Dense sclerotic lesion within the left femoral head likely represents a bone island. No suspicious lytic or blastic osseous lesions. No acute displaced fracture. Multilevel degenerative changes of the spine with chronic appearing vertebral body height loss of the visualized lower thoracic spine and the lumbar spine. T12 and L1 kyphoplasty. IMPRESSION: 1. Moderate volume partially visualized hiatal hernia. 2. Cholelithiasis with no CT findings of acute cholecystitis or choledocholithiasis. 3. Diffuse sigmoid diverticulosis with no acute diverticulitis. 4. Uterine fibroid. 5. Chronic appearing vertebral body height loss of the visualized lower thoracic spine and the lumbar spine status post T12 and L1 kyphoplasty. 6.  Aortic Atherosclerosis (ICD10-I70.0). Electronically Signed   By: Iven Finn M.D.   On: 02/28/2020 20:10   US ABDOMEN LIMITED RUQ (LIVER/GB)  Result Date: 02/28/2020 CLINICAL DATA:  Right upper quadrant abdominal pain. EXAM: ULTRASOUND ABDOMEN LIMITED RIGHT UPPER QUADRANT COMPARISON:  CT of same day. FINDINGS: Gallbladder: Cholelithiasis and sludge is noted within gallbladder lumen. No significant gallbladder wall thickening or pericholecystic fluid is noted. No sonographic Murphy's sign is noted. Common bile duct: Diameter: 7 mm which is within normal limits given the patient's age. Liver: No focal lesion identified. Within normal limits in parenchymal echogenicity. Portal vein is patent  on color Doppler imaging with normal direction of blood flow towards the liver. Other: None. IMPRESSION: Cholelithiasis and sludge is noted. No other abnormality seen in the right upper quadrant of the abdomen. Electronically Signed   By: Marijo Conception M.D.   On: 02/28/2020 22:23    ____________________________________________   PROCEDURES  Procedure(s) performed (including Critical Care):  Procedures   ____________________________________________   INITIAL IMPRESSION / ASSESSMENT AND PLAN / ED COURSE  Leslie Johnston was evaluated in Emergency Department on 02/28/2020 for the symptoms described in the history of present illness. She was evaluated in the context of the global COVID-19 pandemic, which necessitated consideration that the patient might be at risk for infection with the SARS-CoV-2 virus that causes COVID-19. Institutional protocols and algorithms that pertain to the evaluation of patients at risk for COVID-19 are in a state of rapid change based on information released by regulatory bodies including the CDC and federal and state organizations. These policies and algorithms were followed during the patient's care in the ED.    Patient is a 87 year old who comes in with right-sided abdominal pain and back pain seems to be more musculoskeletal in nature given the pain is much worse when she is sitting up and trying to move around  where she then hunches over after trying to move.  Also according the daughter the location of the pain is changed and was originally on the left side which I reviewed urgent care and this is true.  However given patient's age we will get CT scan to make sure is no signs of cholecystitis, pancreatitis, diverticulitis, appendicitis, SBO.  CT scan showed gallstones and hiatal hernia does not.  Does not seem to be related to food but can start her on a PPI.  We discussed the gallstones and opted to get ultrasound to make sure no cholecystitis.  Ultrasound was  negative.  When I push on her right upper quadrant she does not seem to be that painful but when she goes to sit up the pain becomes much more severe.  This time I suspect it is more musculoskeletal in nature given her age I would not want to rush to surgery unless we were for sure there was cholecystitis or an issue with her gallbladder.  Family also agrees with the above.  We will give a surgery referral start her on a PPI.  No evidence of electrolyte abnormalities, anemia, infection.  UA is negative.  At this time patient is safe for discharge home  Oxygen saturation was last 94% that was charted but I was in the room patient was satting 99% and denies any shortness of breath or coughing  ____________________________________________   FINAL CLINICAL IMPRESSION(S) / ED DIAGNOSES   Final diagnoses:  RUQ pain  Hiatal hernia  Biliary calculus of other site without obstruction  Muscle pain      MEDICATIONS GIVEN DURING THIS VISIT:  Medications  iohexol (OMNIPAQUE) 300 MG/ML solution 100 mL (100 mLs Intravenous Contrast Given 02/28/20 1952)     ED Discharge Orders         Ordered    pantoprazole (PROTONIX) 20 MG tablet  Daily        02/28/20 2338           Note:  This document was prepared using Dragon voice recognition software and may include unintentional dictation errors.   Vanessa [place], MD 02/28/20 [phone]

## 2020-03-01 ENCOUNTER — Telehealth: Payer: Self-pay | Admitting: Cardiovascular Disease

## 2020-03-01 NOTE — Telephone Encounter (Signed)
Spoke to pt's daughter, notified of info below.  Dtr appreciative, she is going to follow up with pt's PCP to clarify CKD dx.  Dtr has no further questions at this time.

## 2020-03-01 NOTE — Telephone Encounter (Signed)
Please see previous phone encounter 2/25 regarding details.

## 2020-03-01 NOTE — Telephone Encounter (Signed)
Patient's daughter is calling to discuss last visit. States there was a statement referring to patient kidney and would like to discuss.

## 2020-03-01 NOTE — Telephone Encounter (Signed)
Attempted to call pt's daughter, called number listed under pt and emergency contact. Lmtcb.   When daughter returns call:  I see in chart that the dx of CKD III was noted 07/27/2014 under note by Nobie Putnam, DO.  Looks like this has been under pt's medical history since then.

## 2020-03-03 ENCOUNTER — Ambulatory Visit: Payer: Medicare Other | Admitting: Physician Assistant

## 2020-03-08 ENCOUNTER — Telehealth: Payer: Self-pay | Admitting: Family Medicine

## 2020-03-08 ENCOUNTER — Ambulatory Visit (INDEPENDENT_AMBULATORY_CARE_PROVIDER_SITE_OTHER): Payer: Medicare Other | Admitting: Family Medicine

## 2020-03-08 ENCOUNTER — Encounter: Payer: Self-pay | Admitting: Family Medicine

## 2020-03-08 ENCOUNTER — Other Ambulatory Visit: Payer: Self-pay

## 2020-03-08 VITALS — BP 151/106 | HR 82 | Temp 96.9°F | Ht 65.0 in | Wt 127.2 lb

## 2020-03-08 DIAGNOSIS — F0281 Dementia in other diseases classified elsewhere with behavioral disturbance: Secondary | ICD-10-CM

## 2020-03-08 DIAGNOSIS — F341 Dysthymic disorder: Secondary | ICD-10-CM | POA: Diagnosis not present

## 2020-03-08 DIAGNOSIS — M545 Low back pain, unspecified: Secondary | ICD-10-CM

## 2020-03-08 DIAGNOSIS — I129 Hypertensive chronic kidney disease with stage 1 through stage 4 chronic kidney disease, or unspecified chronic kidney disease: Secondary | ICD-10-CM

## 2020-03-08 DIAGNOSIS — N3281 Overactive bladder: Secondary | ICD-10-CM

## 2020-03-08 DIAGNOSIS — K449 Diaphragmatic hernia without obstruction or gangrene: Secondary | ICD-10-CM

## 2020-03-08 DIAGNOSIS — Z9889 Other specified postprocedural states: Secondary | ICD-10-CM | POA: Diagnosis not present

## 2020-03-08 DIAGNOSIS — N183 Chronic kidney disease, stage 3 unspecified: Secondary | ICD-10-CM | POA: Diagnosis not present

## 2020-03-08 DIAGNOSIS — K219 Gastro-esophageal reflux disease without esophagitis: Secondary | ICD-10-CM | POA: Diagnosis not present

## 2020-03-08 DIAGNOSIS — F02818 Dementia in other diseases classified elsewhere, unspecified severity, with other behavioral disturbance: Secondary | ICD-10-CM

## 2020-03-08 DIAGNOSIS — G8929 Other chronic pain: Secondary | ICD-10-CM

## 2020-03-08 MED ORDER — GABAPENTIN 100 MG PO CAPS: ORAL_CAPSULE | ORAL | 1 refills | Status: DC

## 2020-03-08 MED ORDER — PANTOPRAZOLE SODIUM 40 MG PO TBEC: 40.0000 mg | DELAYED_RELEASE_TABLET | Freq: Every day | ORAL | 2 refills | Status: DC

## 2020-03-08 NOTE — Telephone Encounter (Signed)
Patient's daughter is calling to ask if the doctor has sent over the medications to the pharmacy.  Patient said pharmacy only has one med and the doctor was supposed to send two medications.  She does not remember the name of the other medication.  Please call to discuss [name], daughter at [phone])

## 2020-03-08 NOTE — Telephone Encounter (Signed)
I called her pharmacy and confirmed that Pantoprazole was ready for pick up, and the other med Gabapentin would be ready by 130pm today.  I asked if the pharmacy tech could send a notification when med was ready to the patient / family.  Nobie Putnam, [facility] 03/08/2020, 12:20 PM

## 2020-03-08 NOTE — Patient Instructions (Addendum)
Thank you for coming to the office today.  Emelia Loron., MD  [place]  [place], [place] [address]  [phone] (Work)  For X-ray results.  --------------------------------------  Palliative Care through [facility].  Start Gabapentin 100mg  capsules, take at night for 2-3 nights only, and then increase to 2 times a day for a few days, and then may increase to 3 times a day, it may make you drowsy, if helps significantly at night only, then you can increase instead to 3 capsules at night, instead of 3 times a day - In the future if needed, we can significantly increase the dose if tolerated well, some common doses are 300mg  three times a day up to 600mg  three times a day, usually it takes several weeks or months to get to higher doses  Referral to back to Dr Rudene Christians. [place]   Please schedule a Follow-up Appointment to: Return in about 4 weeks (around 04/05/2020), or if symptoms worsen or fail to improve.  If you have any other questions or concerns, please feel free to call the office or send a message through [facility]. You may also schedule an earlier appointment if necessary.  Additionally, you may be receiving a survey about your experience at our office within a few days to 1 week by e-mail or mail. We value your feedback.  Nobie Putnam, DO [facility]

## 2020-03-08 NOTE — Progress Notes (Signed)
Subjective:    Patient ID: Leslie Johnston, female    DOB: 1925-02-03, 87 y.o.   MRN: [id number]  Leslie Johnston is a 87 y.o. female presenting on 03/08/2020 for Hospitalization Follow-up (Patient was at the ER and was diagnosed with Hiatal Hernia and they mentioned to her about having some gallstones but may not to be something to worry.), Abdominal Pain (Pt continues having some pain that comes and go. It mainly happens on the right upper quadrant but it does spread to her left side, right under her rib cage.), and Leg Pain (Pt both legs hurt and she states it feels funny. She said no notice of numbness.)  History of dementia. Here with daughters.  HPI   HOSPITAL FOLLOW-UP VISIT  Hospital/Location: [place] Date of Admission: 02/10/20 Date of Discharge: 02/12/20 Transitions of care telephone call: Not completed  Reason for Admission: Atrial Fibrillation  [facility] H&P and Discharge Summary have been reviewed - Patient presents today about 1 month after recent hospitalization. Brief summary of recent course, patient had symptoms of Atrial Fibrillation with constellation of symptoms including chest discomfort and palpitations, she was started on anticoagulation and established with Cardiology.  She had abdominal pain and back pain. Unsure if this improved. They thought it was from Atrial Fibrillation. At that time.  She had recurrence of concerning symptoms and went to [facility] on 2/27 for urinary frequency and back pain constellation of symptoms. She had urine testing negative and X-rays Lumbar Spine and Thoracic spine. Reportedly normal to family but no images available now, and they dx with muscle spasms, given her muscle relaxant. Without relief, seemed to make symptom worse  She had history of prior compression fracture injury and had seen Dr [facility] back in 2019 approximately had Kyphoplasty T12-L1  They were worried of something similar  Went  back to Hospital ED  ------------------ ED FOLLOW-UP VISIT  Hospital/Location: [facility] Date of ED Visit: 02/28/20  Reason for Presenting to ED: Back Pain Primary (+Secondary) Diagnosis: HIatal Hernia / Lumbar back pain  FOLLOW-UP  - ED provider note and record have been reviewed  She had CT image of abdomen pelvis at that time, see below showed hiatal hernia and DJD of lumbar spine but no new acute MSK or other injury. Saw gallstones but no acute inflammation or other cause. No constipation  She has BM fairly regularly, usually looser stool  Currently Taking Tylenol Arthritis 8 hour, 1-2 pill every 8 hours or sometimes can take 1 every 4-6 hour  She is complaining of bilateral upper abdominal pain mostly in ribs on flanks and across mid back. Seems fairly consistent but changes sides, when reporting her pain to her daughters.  Some pain meds in past we believe caused confusion such as Tramadol. Mixed results on muscle relaxants.  Urinary Urgency / Frequency Chronic problem for past 3+ years, with urinary symptoms, she was previously treated and started on oxybutynin low dose TID PRN, mixed results didn't seem to help a lot, she only took low dose, now off med, did not have side effects. Has not seen urologist before. No significant incontinence or leakage but may have small problem if doesn't make it to bathroom fast enough. - Asking about possible UTI. Did not have urine culture done last time. Denies abdominal pain, dysuria, hematuria, flank pain  Dysthmia / Insomnia / history of dementia memory loss Chronic problem with some mood lability and insomnia, now has improved on Mirtazapine.  History of dementia, with some gradual decline in confusion and memory issues. Assisted by daughter's [name] and [name].    Depression screen [facility] 2/9 04/15/2019 04/15/2019 08/26/2018  Decreased Interest 0 0 0  Down, Depressed, Hopeless 0 0 0  PHQ - 2 Score 0 0 0  Altered sleeping 0 - -  Tired, decreased  energy 0 - -  Change in appetite 0 - -  Feeling bad or failure about yourself  0 - -  Trouble concentrating 0 - -  Moving slowly or fidgety/restless 0 - -  Suicidal thoughts 0 - -  PHQ-9 Score 0 - -  Difficult doing work/chores Not difficult at all - -    Social History   Tobacco Use  . Smoking status: Never Smoker  . Smokeless tobacco: Never Used  Vaping Use  . Vaping Use: Never used  Substance Use Topics  . Alcohol use: No    Alcohol/week: 0.0 standard drinks  . Drug use: No    Review of Systems Per HPI unless specifically indicated above     Objective:    BP (!) 151/106 (BP Location: Left Arm, Patient Position: Sitting, Cuff Size: Normal)   Pulse 82   Temp (!) 96.9 F (36.1 C) (Temporal)   Ht 5\' 5"  (1.651 m)   Wt 127 lb 3.2 oz (57.7 kg)   SpO2 95%   BMI 21.17 kg/m   Wt Readings from Last 3 Encounters:  03/08/20 127 lb 3.2 oz (57.7 kg)  02/28/20 131 lb (59.4 kg)  02/23/20 132 lb (59.9 kg)    Physical Exam Vitals and nursing note reviewed.  Constitutional:      General: She is not in acute distress.    Appearance: She is well-developed and well-nourished. She is not diaphoretic.     Comments: Thin chronically ill appearing elderly 87 year old female, uncomfortable with some pain mostly mild today, cooperative, uses cane to stand  HENT:     Head: Normocephalic and atraumatic.     Mouth/Throat:     Mouth: Oropharynx is clear and moist.  Eyes:     General:        Right eye: No discharge.        Left eye: No discharge.     Conjunctiva/sclera: Conjunctivae normal.  Neck:     Thyroid: No thyromegaly.  Cardiovascular:     Rate and Rhythm: Normal rate and regular rhythm.     Pulses: Intact distal pulses.     Heart sounds: Normal heart sounds. No murmur heard.   Pulmonary:     Effort: Pulmonary effort is normal. No respiratory distress.     Breath sounds: Normal breath sounds. No wheezing or rales.  Abdominal:     General: Bowel sounds are normal. There  is no distension.     Palpations: Abdomen is soft.     Tenderness: There is no abdominal tenderness.  Musculoskeletal:        General: No edema. Normal range of motion.     Cervical back: Normal range of motion and neck supple.     Comments: Reproducible pain on mid to lower back lower thoracic upper lumbar and across bilateral paraspinal muscles and bilateral lower rib flank area.  Uses cane and able to stand up from seated alone. She has a curvature to back at baseline.  Lymphadenopathy:     Cervical: No cervical adenopathy.  Skin:    General: Skin is warm and dry.     Findings: No erythema or  rash.  Neurological:     Mental Status: She is alert and oriented to person, place, and time.  Psychiatric:        Mood and Affect: Mood and affect normal.        Behavior: Behavior normal.     Comments: Well groomed, good eye contact, normal speech and thoughts      I have personally reviewed the radiology report from 02/28/20 on CT Abdomen.  CLINICAL DATA:  Epigastric pain.  EXAM: CT ABDOMEN AND PELVIS WITH CONTRAST  TECHNIQUE: Multidetector CT imaging of the abdomen and pelvis was performed using the standard protocol following bolus administration of intravenous contrast.  CONTRAST:  1104mL OMNIPAQUE IOHEXOL 300 MG/ML  SOLN  COMPARISON:  None.  FINDINGS: Lower chest: Moderate volume partially visualized hiatal hernia. Otherwise no acute abnormality.  Hepatobiliary: No focal liver abnormality. Calcified gallstones within the gallbladder lumen. No gallbladder wall thickening, or pericholecystic fluid. No biliary dilatation.  Pancreas: No focal lesion. Normal pancreatic contour. No surrounding inflammatory changes. No main pancreatic ductal dilatation.  Spleen: Normal in size without focal abnormality.  Adrenals/Urinary Tract: No adrenal nodule bilaterally. Bilateral kidneys enhance symmetrically. Fluid density lesions likely represent simple renal cysts.  Subcentimeter hypodensities are too small to characterize. No hydronephrosis. No hydroureter. The urinary bladder is unremarkable. On delayed imaging, there is no urothelial wall thickening and there are no filling defects in the opacified portions of the bilateral collecting systems or ureters.  Stomach/Bowel: Stomach is within normal limits. No evidence of bowel wall thickening or dilatation. Diffuse sigmoid diverticulosis. The appendix is not definitely identified.  Vascular/Lymphatic: Several phleboliths noted within the pelvis. No abdominal aorta or iliac aneurysm. Mild to moderate calcified and noncalcified atherosclerotic plaque and tortuosity of the aorta and its branches. No abdominal, pelvic, or inguinal lymphadenopathy.  Reproductive: Coarsely pericentimeter calcified lesion within the posterior wall of the uterus likely represents a degenerative fibroid. Otherwise the uterus and bilateral adnexal regions are unremarkable.  Other: No intraperitoneal free fluid. No intraperitoneal free gas. No organized fluid collection.  Musculoskeletal:  No abdominal wall hernia or abnormality  Diffusely decreased bone density. Dense sclerotic lesion within the left femoral head likely represents a bone island. No suspicious lytic or blastic osseous lesions. No acute displaced fracture. Multilevel degenerative changes of the spine with chronic appearing vertebral body height loss of the visualized lower thoracic spine and the lumbar spine. T12 and L1 kyphoplasty.  IMPRESSION: 1. Moderate volume partially visualized hiatal hernia. 2. Cholelithiasis with no CT findings of acute cholecystitis or choledocholithiasis. 3. Diffuse sigmoid diverticulosis with no acute diverticulitis. 4. Uterine fibroid. 5. Chronic appearing vertebral body height loss of the visualized lower thoracic spine and the lumbar spine status post T12 and L1 kyphoplasty. 6.  Aortic Atherosclerosis  (ICD10-I70.0).   Electronically Signed   By: Iven Finn M.D.   On: 02/28/2020 20:10  Results for orders placed or performed during the hospital encounter of 02/28/20  Lipase, blood  Result Value Ref Range   Lipase 35 11 - 51 U/L  Comprehensive metabolic panel  Result Value Ref Range   Sodium 140 135 - 145 mmol/L   Potassium 4.4 3.5 - 5.1 mmol/L   Chloride 107 98 - 111 mmol/L   CO2 26 22 - 32 mmol/L   Glucose, Bld 111 (H) 70 - 99 mg/dL   BUN 16 8 - 23 mg/dL   Creatinine, Ser 0.81 0.44 - 1.00 mg/dL   Calcium 10.0 8.9 - 10.3 mg/dL   Total Protein  7.5 6.5 - 8.1 g/dL   Albumin 4.1 3.5 - 5.0 g/dL   AST 17 15 - 41 U/L   ALT 10 0 - 44 U/L   Alkaline Phosphatase 76 38 - 126 U/L   Total Bilirubin 0.6 0.3 - 1.2 mg/dL   GFR, Estimated >60 >60 mL/min   Anion gap 7 5 - 15  CBC  Result Value Ref Range   WBC 7.1 4.0 - 10.5 K/uL   RBC 4.94 3.87 - 5.11 MIL/uL   Hemoglobin 13.6 12.0 - 15.0 g/dL   HCT 43.6 36.0 - 46.0 %   MCV 88.3 80.0 - 100.0 fL   MCH 27.5 26.0 - 34.0 pg   MCHC 31.2 30.0 - 36.0 g/dL   RDW 13.1 11.5 - 15.5 %   Platelets 249 150 - 400 K/uL   nRBC 0.0 0.0 - 0.2 %  Urinalysis, Complete w Microscopic  Result Value Ref Range   Color, Urine STRAW (A) YELLOW   APPearance CLEAR (A) CLEAR   Specific Gravity, Urine 1.002 (L) 1.005 - 1.030   pH 7.0 5.0 - 8.0   Glucose, UA NEGATIVE NEGATIVE mg/dL   Hgb urine dipstick NEGATIVE NEGATIVE   Bilirubin Urine NEGATIVE NEGATIVE   Ketones, ur NEGATIVE NEGATIVE mg/dL   Protein, ur NEGATIVE NEGATIVE mg/dL   Nitrite NEGATIVE NEGATIVE   Leukocytes,Ua NEGATIVE NEGATIVE   RBC / HPF 0-5 0 - 5 RBC/hpf   WBC, UA 0-5 0 - 5 WBC/hpf   Bacteria, UA NONE SEEN NONE SEEN   Squamous Epithelial / LPF 0-5 0 - 5  Troponin I (High Sensitivity)  Result Value Ref Range   Troponin I (High Sensitivity) 8 <18 ng/L  Troponin I (High Sensitivity)  Result Value Ref Range   Troponin I (High Sensitivity) 9 <18 ng/L      Assessment & Plan:    Problem List Items Addressed This Visit    OAB (overactive bladder)   Relevant Orders   Amb Referral to Palliative Care   Dysthymia   Relevant Medications   mirtazapine (REMERON) 7.5 MG tablet   Other Relevant Orders   Amb Referral to Palliative Care   Chronic bilateral low back pain without sciatica - Primary   Relevant Medications   acetaminophen (TYLENOL) 650 MG CR tablet   gabapentin (NEURONTIN) 100 MG capsule   Other Relevant Orders   Ambulatory referral to Orthopedic Surgery   Benign hypertension with CKD (chronic kidney disease) stage III   Relevant Orders   Amb Referral to Palliative Care    Other Visit Diagnoses    Gastroesophageal reflux disease without esophagitis       Relevant Medications   alum & mag hydroxide-simeth (MAALOX/MYLANTA) 200-200-20 MG/5ML suspension   pantoprazole (PROTONIX) 40 MG tablet   Other Relevant Orders   Amb Referral to Palliative Care   Dementia associated with other underlying disease with behavioral disturbance (HCC)       Relevant Medications   mirtazapine (REMERON) 7.5 MG tablet   gabapentin (NEURONTIN) 100 MG capsule   Other Relevant Orders   Amb Referral to Palliative Care   History of kyphoplasty       Relevant Orders   Ambulatory referral to Orthopedic Surgery   Hiatal hernia          #Hiatal Hernia / GERD Chronic issue Recently unsure if some of her symptoms are related to indigestion GERD with hiatal hernia seen on CT imaging. Will increase PPI from 20mg  up to 40mg  daily for now. Limit other  symptom relief meds to simplify regimen.  #Chronic Pain - mostly back and rib pain bilateral S/p kyphoplasty T12-L1 in past Osteoporosis  Chronic issue, difficulty discerning clear cause of her pain, CT imaging 2/27 showed no acute MSk etiology but we have known advanced osteoarthritis and OP  Failed some pain medication in past Limited results on scheduled Tylenol dosing Limtied relief on muscle relaxants, DC for now Recommend  trial of Gabapentin new rx sent discussed dosing regimen dose increase for now, may help some neuropathy and chronic back pain.  Palliative may help with pain management as well.  palliative care for home evaluation and symptom management primarily with pain, has other issues with memory decline with dementia, mood / sleep, and urinary OAB symptoms, would benefit from goals of care, symptom management.   She may need further in home care in future, can involve CCM team and other resources as needed.  Orders Placed This Encounter  Procedures  . Ambulatory referral to Orthopedic Surgery    Referral Priority:   Routine    Referral Type:   Surgical    Referral Reason:   Specialty Services Required    Requested Specialty:   Orthopedic Surgery    Number of Visits Requested:   1  . Amb Referral to Palliative Care    Referral Priority:   Routine    Referral Type:   Consultation    Number of Visits Requested:   1     Meds ordered this encounter  Medications  . pantoprazole (PROTONIX) 40 MG tablet    Sig: Take 1 tablet (40 mg total) by mouth daily before breakfast.    Dispense:  30 tablet    Refill:  2  . gabapentin (NEURONTIN) 100 MG capsule    Sig: Start 1 capsule daily, increase by 1 cap every 2-3 days as tolerated up to 3 times a day, or may take 3 at once in evening.    Dispense:  90 capsule    Refill:  1      Follow up plan: Return in about 4 weeks (around 04/05/2020), or if symptoms worsen or fail to improve.    Nobie Putnam, [facility] 03/08/2020, 10:54 AM

## 2020-03-09 ENCOUNTER — Telehealth: Payer: Self-pay | Admitting: Adult Health Nurse Practitioner

## 2020-03-09 NOTE — Telephone Encounter (Signed)
Called patient's daughter [name], to schedule palliative Consult, no answer - left message with my name and call back number requesting return call to schedule visit

## 2020-03-10 ENCOUNTER — Telehealth: Payer: Self-pay | Admitting: Adult Health Nurse Practitioner

## 2020-03-10 NOTE — Telephone Encounter (Signed)
Returned call to daughter, [name], and discussed the Palliative referral/services with her and she was in agreement with scheduling visit.  I have scheduled an In-home Consult for 03/21/20 @ 12:30 PM

## 2020-03-14 ENCOUNTER — Telehealth: Payer: Self-pay | Admitting: Cardiovascular Disease

## 2020-03-14 DIAGNOSIS — M898X8 Other specified disorders of bone, other site: Secondary | ICD-10-CM | POA: Diagnosis not present

## 2020-03-14 DIAGNOSIS — M4856XA Collapsed vertebra, not elsewhere classified, lumbar region, initial encounter for fracture: Secondary | ICD-10-CM | POA: Diagnosis not present

## 2020-03-14 DIAGNOSIS — R0781 Pleurodynia: Secondary | ICD-10-CM | POA: Diagnosis not present

## 2020-03-14 NOTE — Telephone Encounter (Signed)
°*  STAT* If patient is at the pharmacy, call can be transferred to refill team.   1. Which medications need to be refilled? (please list name of each medication and dose if known)  Eliquis 2.5 MG take 1 tablet 2 times daily Diltiazem 120 MG 1 capsule daily  2. Which pharmacy/location (including street and city if local pharmacy) is medication to be sent to? [facility] in [place]  3. Do they need a 30 day or 90 day supply? 90 day

## 2020-03-14 NOTE — Telephone Encounter (Signed)
Please review refill for Eliquis. Looks like it was refilled 02/23/2020.   Diltiazem was refilled 02/23/2020 - 90 days with 3 refills.  Called [facility] to confirm this.  Diltiazem is ready for pick up. Will call patient and make her aware.

## 2020-03-15 ENCOUNTER — Ambulatory Visit: Payer: Medicare Other | Admitting: Unknown Physician Specialty

## 2020-03-18 ENCOUNTER — Other Ambulatory Visit: Payer: Self-pay | Admitting: Orthopedic Surgery

## 2020-03-18 DIAGNOSIS — M5440 Lumbago with sciatica, unspecified side: Secondary | ICD-10-CM

## 2020-03-21 ENCOUNTER — Other Ambulatory Visit: Payer: Self-pay

## 2020-03-21 ENCOUNTER — Other Ambulatory Visit: Payer: Medicare Other | Admitting: Adult Health Nurse Practitioner

## 2020-03-21 DIAGNOSIS — F0390 Unspecified dementia without behavioral disturbance: Secondary | ICD-10-CM

## 2020-03-21 DIAGNOSIS — Z515 Encounter for palliative care: Secondary | ICD-10-CM

## 2020-03-21 DIAGNOSIS — I4891 Unspecified atrial fibrillation: Secondary | ICD-10-CM

## 2020-03-21 DIAGNOSIS — R1030 Lower abdominal pain, unspecified: Secondary | ICD-10-CM | POA: Diagnosis not present

## 2020-03-21 NOTE — Progress Notes (Addendum)
[facility] Consult Note Telephone: [phone]  Fax: [phone]  PATIENT NAME: Leslie Johnston DOB: [birth year] MRN: [id number]  PRIMARY CARE PROVIDER:   Verl Bangs, FNP  REFERRING PROVIDER:  Verl Bangs, FNP No address on file  RESPONSIBLE PARTY:   [name], [place] [phone]  Chief complaint: Initial palliative visit/abdominal pain  Daughter, [name], present during visit today in patient's home.    RECOMMENDATIONS and PLAN:  1.  Advanced care planning.  Patient is DNR.  Patient had just completed advanced directives last week naming daughter [name] as [facility] POA.  Discussed DNR and started going over MOST form.  Patient stated that she did not want life prolonging measures and opted for DNR.  Filled out DNR and uploaded DNR and advanced directives to [facility] and left originals in the home.  Spent more than 20 minutes discussing ACP today.  2.  Lower abdominal and back pain.  The severity of her pain is believed not to be due to to her gallstones or hiatal hernia.  Due to history of osteoporosis and vertebral fractures she was seen by Ortho.  Joint injection was not helpful.  Believe we need to wait for the MRI to determine what is causing her pain for proper treatment.  Continue follow-up and recommendations by Ortho.  3.  A. fib.  Patient continues on Eliquis 2.5 mg twice daily and is doing well with this.  No complaints of chest pain, heart palpitations, headaches, dizziness.  Continue follow-up and recommendations by cardiology.  4.  Dementia.  Patient is having more cognitive and functional decline at this time.  Did discuss with daughter her mother's safety as her cognitive function continues to decline.  We will continue to assess and give recommendations for more assistance in the home when needed.  Palliative will continue to monitor for symptom management/decline and make recommendations as needed.   Next appointment in 4 weeks.  Encouraged to call with any questions or concerns.  HISTORY OF PRESENT ILLNESS:  MARSHAL Johnston is a 87 y.o. year old female with multiple medical problems including A. fib, HTN, dementia, CKD stage III, overactive bladder, osteoporosis with past pathological fracture of humerus. Palliative Care was asked to help address goals of care.  Patient lives in her own home.  One of her children stays with her every night and someone is present most days. Reviewed medical chart including most recent labs and imaging.  Patient had hospitalization 2/9-2/11/22 with new onset A. fib and was started on Eliquis 2.5 mg twice daily.  Daughter does state that she originally had gone to the ER for this visit with abdominal pain.  She returned to ER on 02/28/2020 and was diagnosed with gallstones and hiatal hernia.  Believe that the gallstones were stable and not causing her pain.  Daughter does state that years ago she was having low back pain that radiated to her abdomen and was found to have fracture of vertebrae in which she had kyphoplasty performed.  Patient does complain today of sharp pain with certain movement that seems to start on her right side that goes across her abdomen and her back in a band across her lower back and abdomen.  Daughter does state that she will complain of pain sometimes when she is sitting and talking but it does seem to worsen when she goes to bed and goes from the seated to lying down position.  She was seen at  Ortho and had joint injection last week and this did not help.  She has an MRI scheduled for next week. Daughter states that her mother is having increased forgetfulness and repeats herself often.  Today she could not tell me the year but was able to tell me the month.  Daughter does state that she will open the door to strangers which is something she used to not do.  She does have neighbors that watch out for her when her family is not there.  Patient is  able to walk independently but does have a cane that she uses at times.  She is becoming more sedentary but daughter states that this has been going on for over a year.  She requires help with bathing.  Daughters put out her close for her and she is able to dress herself.  Daughter states that she used to play word searches and read the newspaper and is not able to do this now.  Unable to determine if this is cognitive or visual as patient does have macular degeneration.  Patient is able to fix herself a sandwich if she is by herself.  Mostly family prepares her meals for her and pays her bills.  Patient is continent of bowel and bladder.  Daughter states that she is eating a little bit less but still eats meals prepared for her.  States that over the last 3 months she has gone from 132 pounds with BMI of 21.31 and is now 127 pounds with BMI of 20.5.  Rest of 10 point ROS asked and negative.  Patient has not had any recent falls and no reports of wounds.  CODE STATUS: DNR  PPS: 50% HOSPICE ELIGIBILITY/DIAGNOSIS: TBD  PHYSICAL EXAM:  BP 138/76 HR 77 O2 95% on room air General: NAD, frail appearing Eyes: Sclera anicteric and noninjected with no discharge noted ENMT: Moist mucous membranes Cardiovascular: regular rate and rhythm Pulmonary: Lung sounds clear; normal respiratory effort Abdomen: soft, nontender, + bowel sounds Extremities: no edema, no joint deformities Skin: no rashes on exposed skin Neurological: Weakness; alert and oriented to person and place   Family History  Problem Relation Age of Onset  . Pancreatic cancer Sister   . Arthritis Maternal Grandmother   . Leukemia Maternal Grandfather   . Asthma Maternal Grandfather   . Breast cancer Daughter   . Bladder Cancer Daughter      PAST MEDICAL HISTORY:  Past Medical History:  Diagnosis Date  . Allergy   . Hypertension     SOCIAL HX:  Social History   Tobacco Use  . Smoking status: Never Smoker  . Smokeless  tobacco: Never Used  Substance Use Topics  . Alcohol use: No    Alcohol/week: 0.0 standard drinks    ALLERGIES:  Allergies  Allergen Reactions  . Amlodipine Swelling  . Losartan Itching  . Penicillins Hives and Other (See Comments)    Has patient had a PCN reaction causing immediate rash, facial/tongue/throat swelling, SOB or lightheadedness with hypotension: No Has patient had a PCN reaction causing severe rash involving mucus membranes or skin necrosis: No Has patient had a PCN reaction that required hospitalization: No Has patient had a PCN reaction occurring within the last 10 years: No If all of the above answers are "NO", then may proceed with Cephalosporin use.   . Tramadol Other (See Comments)    Confusion     PERTINENT MEDICATIONS:  Outpatient Encounter Medications as of 03/21/2020  Medication Sig  . acetaminophen (  TYLENOL) 650 MG CR tablet Take 650 mg by mouth every 8 (eight) hours as needed for pain.  Marland Kitchen alum & mag hydroxide-simeth (MAALOX/MYLANTA) 200-200-20 MG/5ML suspension Take by mouth every 6 (six) hours as needed for indigestion or heartburn.  Marland Kitchen apixaban (ELIQUIS) 2.5 MG TABS tablet Take 1 tablet (2.5 mg total) by mouth 2 (two) times daily.  . Calcium Carb-Cholecalciferol (CALCIUM 600 + D PO) Take 1 tablet by mouth daily with lunch. (Patient not taking: Reported on 03/08/2020)  . diltiazem (CARDIZEM CD) 120 MG 24 hr capsule Take 1 capsule (120 mg total) by mouth daily.  Marland Kitchen gabapentin (NEURONTIN) 100 MG capsule Start 1 capsule daily, increase by 1 cap every 2-3 days as tolerated up to 3 times a day, or may take 3 at once in evening.  Marland Kitchen lisinopril (ZESTRIL) 20 MG tablet Take 1 tablet (20 mg total) by mouth daily.  . mirabegron ER (MYRBETRIQ) 25 MG TB24 tablet Take 1 tablet (25 mg total) by mouth daily. (Patient not taking: Reported on 03/08/2020)  . mirtazapine (REMERON) 7.5 MG tablet Take 7.5 mg by mouth at bedtime.  . Multiple Vitamin (MULTIVITAMIN WITH MINERALS) TABS  tablet Take 1 tablet by mouth daily with lunch.   . Multiple Vitamins-Minerals (PRESERVISION AREDS 2+MULTI VIT PO) Take by mouth.  . pantoprazole (PROTONIX) 40 MG tablet Take 1 tablet (40 mg total) by mouth daily before breakfast.   No facility-administered encounter medications on file as of 03/21/2020.      Merri Dimaano Jenetta Downer, NP

## 2020-03-28 ENCOUNTER — Telehealth: Payer: Self-pay | Admitting: Family Medicine

## 2020-03-28 NOTE — Telephone Encounter (Signed)
Copied from [place] [id number]. Topic: Medicare AWV >> Mar 28, 2020 10:01 AM Cher Nakai R wrote: Reason for CRM:   No answer unable to leave a message for patient to call back and schedule the Medicare Annual Wellness Visit (AWV) virtually or by telephone.  Last AWV 08/26/2018  Please schedule at anytime with [facility].  40 minute appointment  Any questions, please call me at [phone]

## 2020-03-31 ENCOUNTER — Other Ambulatory Visit: Payer: Self-pay

## 2020-03-31 ENCOUNTER — Ambulatory Visit
Admission: RE | Admit: 2020-03-31 | Discharge: 2020-03-31 | Disposition: A | Discharge status: Home / Self Care | Payer: Medicare Other | Source: Ambulatory Visit | Attending: Orthopedic Surgery | Admitting: Orthopedic Surgery

## 2020-03-31 DIAGNOSIS — M5440 Lumbago with sciatica, unspecified side: Secondary | ICD-10-CM | POA: Insufficient documentation

## 2020-03-31 DIAGNOSIS — M545 Low back pain, unspecified: Secondary | ICD-10-CM | POA: Diagnosis not present

## 2020-04-04 NOTE — Progress Notes (Signed)
Cardiology Office Note  Date:  04/05/2020   ID:  Leslie Johnston, DOB 1925/05/31, MRN [id number]  PCP:  System, Provider Not In   Chief Complaint  Patient presents with  . 1 month follow up     Patient c/o shortness of breath. Medications reviewed by the patient verbally.     HPI:  Leslie Johnston is a 87 y.o. female with a hx of  HTN, overactive bladder, osteoporosis,  dementia,  Afib  who presents for follow-up for afib.   Last seen in clinic February 23, 2020 by one of our providers Hospital records reviewed  admitted 2/9-2/11 with chest pain and abdominal pain.   found to be in  onset Afib with rates up into the 170s.   Labs were unremarkable.  Patient converted to SR with IV diltiazem.   Echo showed LVEF 60-65%, no WMA, G1DD, normal RV function.   Diltiazem was consolidated to 120mg  daily.   started on Eliquis 2.5mg  BID (age and weight)   In follow-up today  tolerating Eliquis, no bleeding issues.   tolerating diltiazem.   Mild weight loss Back pain, arthritis On tylenol and gabapentin  EKG personally reviewed by myself on todays visit NSR rate 69 bpm sinus arrhythmia   PMH:   has a past medical history of Allergy and Hypertension.  PSH:    Past Surgical History:  Procedure Laterality Date  . EYE SURGERY     cataract extraction- Right  . KYPHOPLASTY N/A 11/26/2017   Procedure: [id number];  Surgeon: Hessie Knows, MD;  Location: [facility] ORS;  Service: Orthopedics;  Laterality: N/A;    Current Outpatient Medications  Medication Sig Dispense Refill  . acetaminophen (TYLENOL) 650 MG CR tablet Take 650 mg by mouth every 8 (eight) hours as needed for pain.    Marland Kitchen alum & mag hydroxide-simeth (MAALOX/MYLANTA) 200-200-20 MG/5ML suspension Take by mouth every 6 (six) hours as needed for indigestion or heartburn.    Marland Kitchen apixaban (ELIQUIS) 2.5 MG TABS tablet Take 1 tablet (2.5 mg total) by mouth 2 (two) times daily. 180 tablet 3  . diltiazem (CARDIZEM CD) 120  MG 24 hr capsule Take 1 capsule (120 mg total) by mouth daily. 90 capsule 3  . gabapentin (NEURONTIN) 100 MG capsule Start 1 capsule daily, increase by 1 cap every 2-3 days as tolerated up to 3 times a day, or may take 3 at once in evening. 90 capsule 1  . lisinopril (ZESTRIL) 20 MG tablet Take 1 tablet (20 mg total) by mouth daily. 90 tablet 3  . mirtazapine (REMERON) 7.5 MG tablet Take 7.5 mg by mouth at bedtime.    . Multiple Vitamin (MULTIVITAMIN WITH MINERALS) TABS tablet Take 1 tablet by mouth daily with lunch.     . Multiple Vitamins-Minerals (PRESERVISION AREDS 2+MULTI VIT PO) Take by mouth.    . pantoprazole (PROTONIX) 40 MG tablet Take 1 tablet (40 mg total) by mouth daily before breakfast. 30 tablet 2   No current facility-administered medications for this visit.     Allergies:   Amlodipine, Losartan, Penicillins, and Tramadol   Social History:  The patient  reports that she has never smoked. She has never used smokeless tobacco. She reports that she does not drink alcohol and does not use drugs.   Family History:   family history includes Arthritis in her maternal grandmother; Asthma in her maternal grandfather; Bladder Cancer in her daughter; Breast cancer in her daughter; Leukemia in her maternal grandfather; Pancreatic cancer in her sister.  Review of Systems: Review of Systems  Constitutional: Negative.   HENT: Negative.   Respiratory: Negative.   Cardiovascular: Negative.   Gastrointestinal: Negative.   Musculoskeletal: Negative.   Neurological: Negative.   Psychiatric/Behavioral: Negative.   All other systems reviewed and are negative.   PHYSICAL EXAM: VS:  BP 120/64 (BP Location: Left Arm, Patient Position: Sitting, Cuff Size: Normal)   Pulse 69   Ht 5\' 7"  (1.702 m)   Wt 128 lb 2 oz (58.1 kg)   BMI 20.07 kg/m  , BMI Body mass index is 20.07 kg/m. GEN: Well nourished, well developed, in no acute distress HEENT: normal Neck: no JVD, carotid bruits, or  masses Cardiac: RRR; no murmurs, rubs, or gallops,no edema  Respiratory:  clear to auscultation bilaterally, normal work of breathing GI: soft, nontender, nondistended, + BS MS: no deformity or atrophy Skin: warm and dry, no rash Neuro:  Strength and sensation are intact Psych: euthymic mood, full affect  Recent Labs: 02/10/2020: Magnesium 2.4; TSH 0.797 02/28/2020: ALT 10; BUN 16; Creatinine, Ser 0.81; Hemoglobin 13.6; Platelets 249; Potassium 4.4; Sodium 140    Lipid Panel Lab Results  Component Value Date   CHOL 142 02/12/2020   HDL 45 02/12/2020   LDLCALC 81 02/12/2020   TRIG 79 02/12/2020      Wt Readings from Last 3 Encounters:  04/05/20 128 lb 2 oz (58.1 kg)  03/08/20 127 lb 3.2 oz (57.7 kg)  02/28/20 131 lb (59.4 kg)      ASSESSMENT AND PLAN:  Problem List Items Addressed This Visit   None   Visit Diagnoses    Paroxysmal A-fib ([place])    -  Primary   Relevant Orders   EKG 12-Lead   Essential hypertension       Relevant Orders   EKG 12-Lead   Stage 3 chronic kidney disease, unspecified whether stage 3a or 3b CKD (HCC)         Atrial fib, paroxysmal Maintaining normal sinus rhythm, continue diltiazem extended release 120 daily with Eliquis 2.5 twice daily  Essential hypertension Mild weight loss over the past year, May need to decrease lisinopril down to 10 mg daily if weight continues to drop Suggested family monitor blood pressure at home especially for orthostasis symptoms  Abdominal pain Family has restricted her diet, no further episodes   Total encounter time more than 25 minutes  Greater than 50% was spent in counseling and coordination of care with the patient    Signed, Esmond Plants, M.D., Ph.D. [place], [place]

## 2020-04-05 ENCOUNTER — Other Ambulatory Visit: Payer: Self-pay

## 2020-04-05 ENCOUNTER — Ambulatory Visit (INDEPENDENT_AMBULATORY_CARE_PROVIDER_SITE_OTHER): Payer: Medicare Other | Admitting: Cardiovascular Disease

## 2020-04-05 ENCOUNTER — Encounter: Payer: Self-pay | Admitting: Cardiovascular Disease

## 2020-04-05 VITALS — BP 120/64 | HR 69 | Ht 67.0 in | Wt 128.1 lb

## 2020-04-05 DIAGNOSIS — N183 Chronic kidney disease, stage 3 unspecified: Secondary | ICD-10-CM

## 2020-04-05 DIAGNOSIS — I10 Essential (primary) hypertension: Secondary | ICD-10-CM

## 2020-04-05 DIAGNOSIS — I480 Paroxysmal atrial fibrillation: Secondary | ICD-10-CM

## 2020-04-05 NOTE — Patient Instructions (Addendum)
Medication Instructions:  If blood pressure runs low, (<110 on the top number)  Ok to cut the lisinopril in 1/2 daily (10 mg)   Lab work: No new labs needed  Testing/Procedures: No new testing needed   Follow-Up:   . You will need a follow up appointment in 6 months  . Providers on your designated Care Team:   . Murray Hodgkins, NP . Christell Faith, PA-C . Marrianne Mood, PA-C    COVID-19 Vaccine Information can be found at: ShippingScam.co.uk For questions related to vaccine distribution or appointments, please email vaccine@[email or web address] or call [phone].

## 2020-04-10 ENCOUNTER — Other Ambulatory Visit: Payer: Self-pay | Admitting: Family Medicine

## 2020-04-10 DIAGNOSIS — I129 Hypertensive chronic kidney disease with stage 1 through stage 4 chronic kidney disease, or unspecified chronic kidney disease: Secondary | ICD-10-CM

## 2020-04-10 DIAGNOSIS — N183 Chronic kidney disease, stage 3 unspecified: Secondary | ICD-10-CM

## 2020-04-18 ENCOUNTER — Other Ambulatory Visit: Payer: Medicare Other | Admitting: Adult Health Nurse Practitioner

## 2020-04-18 DIAGNOSIS — G8929 Other chronic pain: Secondary | ICD-10-CM | POA: Diagnosis not present

## 2020-04-18 DIAGNOSIS — M48061 Spinal stenosis, lumbar region without neurogenic claudication: Secondary | ICD-10-CM | POA: Diagnosis not present

## 2020-04-18 DIAGNOSIS — M545 Low back pain, unspecified: Secondary | ICD-10-CM | POA: Diagnosis not present

## 2020-04-19 ENCOUNTER — Other Ambulatory Visit: Payer: Self-pay | Admitting: Family Medicine

## 2020-04-19 DIAGNOSIS — F0281 Dementia in other diseases classified elsewhere with behavioral disturbance: Secondary | ICD-10-CM

## 2020-04-19 DIAGNOSIS — F341 Dysthymic disorder: Secondary | ICD-10-CM

## 2020-04-19 DIAGNOSIS — F02818 Dementia in other diseases classified elsewhere, unspecified severity, with other behavioral disturbance: Secondary | ICD-10-CM

## 2020-04-19 DIAGNOSIS — K219 Gastro-esophageal reflux disease without esophagitis: Secondary | ICD-10-CM

## 2020-04-19 NOTE — Telephone Encounter (Signed)
Requested medication (s) are due for refill today: no  Requested medication (s) are on the active medication list: yes   Last refill: 03/08/2020  Future visit scheduled:no  Notes to clinic: medication filled by a historical provider Review for continued use and refill     Requested Prescriptions  Pending Prescriptions Disp Refills   pantoprazole (PROTONIX) 40 MG tablet [Pharmacy Med Name: PANTOPRAZOLE 40MG  TABLETS] 30 tablet 2    Sig: TAKE 1 TABLET(40 MG) BY MOUTH DAILY BEFORE BREAKFAST      Gastroenterology: Proton Pump Inhibitors Passed - 04/19/2020  2:46 PM      Passed - Valid encounter within last 12 months    Recent Outpatient Visits           1 month ago Chronic bilateral low back pain without sciatica   [place], Devonne Doughty, DO   1 year ago Benign hypertension with CKD (chronic kidney disease) stage III   [facility] [place], Devonne Doughty, DO   2 years ago Left hip pain   [facility] [place], Jerrel Ivory, NP   2 years ago Urinary incontinence in female   [place], NP   2 years ago Chronic bilateral low back pain without sciatica   [place], DO                  mirtazapine (REMERON) 7.5 MG tablet [Pharmacy Med Name: MIRTAZAPINE 7.5MG  TABLETS] 90 tablet     Sig: TAKE 1 TABLET(7.5 MG) BY MOUTH AT BEDTIME      Psychiatry: Antidepressants - mirtazapine Failed - 04/19/2020  2:46 PM      Failed - Completed PHQ-2 or PHQ-9 in the last 360 days      Passed - AST in normal range and within 360 days    AST  Date Value Ref Range Status  02/28/2020 17 15 - 41 U/L Final          Passed - ALT in normal range and within 360 days    ALT  Date Value Ref Range Status  02/28/2020 10 0 - 44 U/L Final          Passed - Triglycerides in normal range and within 360 days    Triglycerides  Date Value Ref Range Status   02/12/2020 79 <150 mg/dL Final          Passed - Total Cholesterol in normal range and within 360 days    Cholesterol  Date Value Ref Range Status  02/12/2020 142 0 - 200 mg/dL Final          Passed - WBC in normal range and within 360 days    WBC  Date Value Ref Range Status  02/28/2020 7.1 4.0 - 10.5 K/uL Final          Passed - Valid encounter within last 6 months    Recent Outpatient Visits           1 month ago Chronic bilateral low back pain without sciatica   [place], DO   1 year ago Benign hypertension with CKD (chronic kidney disease) stage III   [facility] [place], Devonne Doughty, DO   2 years ago Left hip pain   [facility] [name], NP   2 years ago Urinary incontinence in female   [place], [place],  NP   2 years ago Chronic bilateral low back pain without sciatica   [place], [place]

## 2020-04-25 ENCOUNTER — Telehealth: Payer: Self-pay | Admitting: Adult Health Nurse Practitioner

## 2020-04-25 NOTE — Telephone Encounter (Signed)
Reminder call of tomorrow's appointment at 12:30 Leslie Johnston K. Olena Heckle NP

## 2020-04-26 ENCOUNTER — Other Ambulatory Visit: Payer: Self-pay

## 2020-04-26 ENCOUNTER — Other Ambulatory Visit: Payer: Medicare Other | Admitting: Adult Health Nurse Practitioner

## 2020-04-26 VITALS — BP 132/66 | HR 78

## 2020-04-26 DIAGNOSIS — F0390 Unspecified dementia without behavioral disturbance: Secondary | ICD-10-CM | POA: Diagnosis not present

## 2020-04-26 DIAGNOSIS — Z515 Encounter for palliative care: Secondary | ICD-10-CM | POA: Diagnosis not present

## 2020-04-26 DIAGNOSIS — G8929 Other chronic pain: Secondary | ICD-10-CM

## 2020-04-26 DIAGNOSIS — M545 Low back pain, unspecified: Secondary | ICD-10-CM | POA: Diagnosis not present

## 2020-04-26 NOTE — Progress Notes (Signed)
[facility] Consult Note Telephone: [phone]  Fax: [phone]    Date of encounter: 04/26/20 PATIENT NAME: Leslie Johnston [address] [place] [address]   [phone] (home)  DOB: 1925-01-08 MRN: [id number] PRIMARY CARE PROVIDER:    System, Provider Not In,  No address on file None  REFERRING PROVIDER:   Verl Bangs, FNP No address on file N/A  RESPONSIBLE PARTY:    Contact Information    Name Relation Home Work Mobile   Leslie Johnston Daughter   [phone]   Leslie Johnston Daughter   [phone]       I met face to face with patient and family in home. Palliative Care was asked to follow this patient by consultation request of  [place], Lupita Raider, FNP to address advance care planning and complex medical decision making. This is a follow up visit.  Daughters, Leslie Johnston and Leslie Johnston, present during visit and home today                                   ASSESSMENT AND PLAN / RECOMMENDATIONS:   Advance Care Planning/Goals of Care: Goals include to maximize quality of life and symptom management.  CODE STATUS: DNR  Symptom Management/Plan:  Chronic low back pain: Patient has good relief with joint injections.  Continue follow-up and recommendations by physiatry  Dementia: Patient having more forgetfulness and at times may not have the best judgment such as opening doors to strangers.  Patient's daughters take turns taking care of her in the home.  Have asked about applying for Medicaid and discussed going to Department of social services with her in person or online.  Discussed that if they ran into issues that we could do a social work referral if needed.  Main concern is patient's safety and will continue to monitor for any safety issues and make recommendations as needed.   Follow up Palliative Care Visit: Palliative care will continue to follow for complex medical decision making, advance care planning, and clarification  of goals. Return 8 weeks or prn. Encouraged to call with any questions or concerns  I spent 60 minutes providing this consultation. More than 50% of the time in this consultation was spent in counseling and care coordination.  PPS: 50%  HOSPICE ELIGIBILITY/DIAGNOSIS: TBD  Chief Complaint: follow up palliative visit  HISTORY OF PRESENT ILLNESS:  [name] is a 87 y.o. year old female  with A. fib, HTN, dementia, CKD stage III, overactive bladder, osteoporosis with past pathological fracture of humerus.  Patient had MRI on 03/31/2020 which showed a T12 and L1 compression fracture s/p kyphoplasty and mild to moderate stenosis from L2-L5.  She was recommended to go to physiatry and was given joint injections in her back.  This has given her a lot of relief though daughter does state that she will still moan sometimes.  Patient states that she had about 3 to 4 days in which she was having upset stomach but this has passed.  Patient has not had any falls and no wounds reported.  Her appetite is the same and her weight is stable at around 128.  Daughters do have concerns that she is becoming more forgetful and she will open the door to strangers though patient denies doing this.  Rest of 10 point ROS asked and negative except what is stated in HPI  History obtained from review of EMR  and interview with family and Leslie Johnston.  I reviewed available labs, medications, imaging, studies and related documents from the EMR.  Records reviewed and summarized above.    PHYSICAL EXAM:   General: NAD, frail appearing Eyes: Sclera anicteric and noninjected with no discharge noted ENMT: Moist mucous membranes Cardiovascular: regular rate and rhythm Pulmonary: Lung sounds clear; normal respiratory effort Abdomen: soft, nontender, + bowel sounds Extremities: no edema, no joint deformities Skin: no rashes on exposed skin Neurological:  alert and oriented to person and place  Thank you for the opportunity  to participate in the care of Leslie Johnston.  The palliative care team will continue to follow. Please call our office at [phone] if we can be of additional assistance.   Leslie Johnston Jenetta Downer, NP , DNP  This chart was dictated using voice recognition software. Despite best efforts to proofread, errors can occur which can change the documentation meaning.   COVID-19 PATIENT SCREENING TOOL Asked and negative response unless otherwise noted:   Have you had symptoms of covid, tested positive or been in contact with someone with symptoms/positive test in the past 5-10 days? negative

## 2020-04-27 ENCOUNTER — Other Ambulatory Visit: Payer: Self-pay | Admitting: Family Medicine

## 2020-04-27 DIAGNOSIS — N183 Chronic kidney disease, stage 3 unspecified: Secondary | ICD-10-CM

## 2020-04-27 DIAGNOSIS — I129 Hypertensive chronic kidney disease with stage 1 through stage 4 chronic kidney disease, or unspecified chronic kidney disease: Secondary | ICD-10-CM

## 2020-05-04 DIAGNOSIS — G8929 Other chronic pain: Secondary | ICD-10-CM | POA: Diagnosis not present

## 2020-05-04 DIAGNOSIS — M48061 Spinal stenosis, lumbar region without neurogenic claudication: Secondary | ICD-10-CM | POA: Diagnosis not present

## 2020-05-04 DIAGNOSIS — M546 Pain in thoracic spine: Secondary | ICD-10-CM | POA: Diagnosis not present

## 2020-05-04 DIAGNOSIS — M545 Low back pain, unspecified: Secondary | ICD-10-CM | POA: Diagnosis not present

## 2020-05-15 ENCOUNTER — Emergency Department: Payer: Medicare Other

## 2020-05-15 ENCOUNTER — Other Ambulatory Visit: Payer: Self-pay

## 2020-05-15 ENCOUNTER — Observation Stay: Payer: Medicare Other

## 2020-05-15 ENCOUNTER — Inpatient Hospital Stay
Admission: EM | Admit: 2020-05-15 | Discharge: 2020-05-17 | DRG: 543 | Disposition: A | Discharge status: Home Health | Payer: Medicare Other | Attending: Family Medicine | Admitting: Family Medicine

## 2020-05-15 DIAGNOSIS — R000 Tachycardia, unspecified: Secondary | ICD-10-CM | POA: Diagnosis not present

## 2020-05-15 DIAGNOSIS — I4891 Unspecified atrial fibrillation: Secondary | ICD-10-CM | POA: Diagnosis present

## 2020-05-15 DIAGNOSIS — Z20822 Contact with and (suspected) exposure to covid-19: Secondary | ICD-10-CM | POA: Diagnosis not present

## 2020-05-15 DIAGNOSIS — Z803 Family history of malignant neoplasm of breast: Secondary | ICD-10-CM

## 2020-05-15 DIAGNOSIS — M8008XA Age-related osteoporosis with current pathological fracture, vertebra(e), initial encounter for fracture: Principal | ICD-10-CM | POA: Diagnosis present

## 2020-05-15 DIAGNOSIS — K8020 Calculus of gallbladder without cholecystitis without obstruction: Secondary | ICD-10-CM | POA: Diagnosis present

## 2020-05-15 DIAGNOSIS — F0390 Unspecified dementia without behavioral disturbance: Secondary | ICD-10-CM | POA: Diagnosis not present

## 2020-05-15 DIAGNOSIS — R64 Cachexia: Secondary | ICD-10-CM | POA: Diagnosis not present

## 2020-05-15 DIAGNOSIS — K219 Gastro-esophageal reflux disease without esophagitis: Secondary | ICD-10-CM | POA: Diagnosis not present

## 2020-05-15 DIAGNOSIS — R1013 Epigastric pain: Secondary | ICD-10-CM | POA: Diagnosis present

## 2020-05-15 DIAGNOSIS — Z800 Family history of malignant neoplasm of digestive organs: Secondary | ICD-10-CM

## 2020-05-15 DIAGNOSIS — Z6820 Body mass index (BMI) 20.0-20.9, adult: Secondary | ICD-10-CM

## 2020-05-15 DIAGNOSIS — Z8052 Family history of malignant neoplasm of bladder: Secondary | ICD-10-CM

## 2020-05-15 DIAGNOSIS — Z87311 Personal history of (healed) other pathological fracture: Secondary | ICD-10-CM

## 2020-05-15 DIAGNOSIS — I10 Essential (primary) hypertension: Secondary | ICD-10-CM | POA: Diagnosis present

## 2020-05-15 DIAGNOSIS — R52 Pain, unspecified: Secondary | ICD-10-CM | POA: Diagnosis not present

## 2020-05-15 DIAGNOSIS — Z825 Family history of asthma and other chronic lower respiratory diseases: Secondary | ICD-10-CM

## 2020-05-15 DIAGNOSIS — Z7901 Long term (current) use of anticoagulants: Secondary | ICD-10-CM

## 2020-05-15 DIAGNOSIS — I480 Paroxysmal atrial fibrillation: Secondary | ICD-10-CM | POA: Diagnosis present

## 2020-05-15 DIAGNOSIS — R627 Adult failure to thrive: Secondary | ICD-10-CM | POA: Diagnosis not present

## 2020-05-15 DIAGNOSIS — Z8261 Family history of arthritis: Secondary | ICD-10-CM

## 2020-05-15 DIAGNOSIS — G8929 Other chronic pain: Secondary | ICD-10-CM | POA: Diagnosis present

## 2020-05-15 DIAGNOSIS — R1084 Generalized abdominal pain: Secondary | ICD-10-CM | POA: Diagnosis not present

## 2020-05-15 DIAGNOSIS — Z806 Family history of leukemia: Secondary | ICD-10-CM

## 2020-05-15 DIAGNOSIS — R0689 Other abnormalities of breathing: Secondary | ICD-10-CM | POA: Diagnosis not present

## 2020-05-15 DIAGNOSIS — K449 Diaphragmatic hernia without obstruction or gangrene: Secondary | ICD-10-CM | POA: Diagnosis present

## 2020-05-15 HISTORY — DX: Unspecified atrial fibrillation: I48.91

## 2020-05-15 HISTORY — DX: Gastro-esophageal reflux disease without esophagitis: K21.9

## 2020-05-15 HISTORY — DX: Other chronic pain: G89.29

## 2020-05-15 HISTORY — DX: Allergic rhinitis, unspecified: J30.9

## 2020-05-15 HISTORY — DX: Low back pain, unspecified: M54.50

## 2020-05-15 LAB — COMPREHENSIVE METABOLIC PANEL
ALT: 12 U/L (ref 0–44)
AST: 24 U/L (ref 15–41)
Albumin: 3.5 g/dL (ref 3.5–5.0)
Alkaline Phosphatase: 72 U/L (ref 38–126)
Anion gap: 7 (ref 5–15)
BUN: 15 mg/dL (ref 8–23)
CO2: 26 mmol/L (ref 22–32)
Calcium: 9.3 mg/dL (ref 8.9–10.3)
Chloride: 105 mmol/L (ref 98–111)
Creatinine, Ser: 0.75 mg/dL (ref 0.44–1.00)
GFR, Estimated: >60 mL/min (ref >60)
Glucose, Bld: 116 mg/dL — ABNORMAL HIGH (ref 70–99)
Potassium: 4.3 mmol/L (ref 3.5–5.1)
Sodium: 138 mmol/L (ref 135–145)
Total Bilirubin: 0.6 mg/dL (ref 0.3–1.2)
Total Protein: 6.5 g/dL (ref 6.5–8.1)

## 2020-05-15 LAB — URINALYSIS, COMPLETE (UACMP) WITH MICROSCOPIC
Bacteria, UA: NONE SEEN
Bilirubin Urine: NEGATIVE
Glucose, UA: NEGATIVE mg/dL
Hgb urine dipstick: NEGATIVE
Ketones, ur: NEGATIVE mg/dL
Leukocytes,Ua: NEGATIVE
Nitrite: NEGATIVE
Protein, ur: NEGATIVE mg/dL
Specific Gravity, Urine: 1.004 — ABNORMAL LOW (ref 1.005–1.030)
pH: 8 (ref 5.0–8.0)

## 2020-05-15 LAB — LIPASE, BLOOD: Lipase: 33 U/L (ref 11–51)

## 2020-05-15 LAB — CBC
HCT: 40.2 % (ref 36.0–46.0)
Hemoglobin: 12.6 g/dL (ref 12.0–15.0)
MCH: 27.6 pg (ref 26.0–34.0)
MCHC: 31.3 g/dL (ref 30.0–36.0)
MCV: 88.2 fL (ref 80.0–100.0)
Platelets: 222 10*3/uL (ref 150–400)
RBC: 4.56 MIL/uL (ref 3.87–5.11)
RDW: 13.6 % (ref 11.5–15.5)
WBC: 8.4 10*3/uL (ref 4.0–10.5)
nRBC: 0 % (ref 0.0–0.2)

## 2020-05-15 LAB — MAGNESIUM: Magnesium: 1.9 mg/dL (ref 1.7–2.4)

## 2020-05-15 LAB — TROPONIN I (HIGH SENSITIVITY)
Troponin I (High Sensitivity): 5 ng/L (ref <18)
Troponin I (High Sensitivity): 6 ng/L (ref <18)

## 2020-05-15 MED ORDER — NALOXONE HCL 2 MG/2ML IJ SOSY
0.4000 mg | PREFILLED_SYRINGE | INTRAMUSCULAR | Status: DC | PRN
  Filled 2020-05-15: qty 2

## 2020-05-15 MED ORDER — APIXABAN 2.5 MG PO TABS
2.5000 mg | ORAL_TABLET | Freq: Two times a day (BID) | ORAL | Status: DC
  Administered 2020-05-16: 2.5 mg via ORAL
  Filled 2020-05-15: qty 1

## 2020-05-15 MED ORDER — PANTOPRAZOLE SODIUM 40 MG IV SOLR
40.0000 mg | INTRAVENOUS | Status: DC
  Administered 2020-05-15 – 2020-05-16 (×2): 40 mg via INTRAVENOUS
  Filled 2020-05-15 (×2): qty 40

## 2020-05-15 MED ORDER — LIDOCAINE VISCOUS HCL 2 % MT SOLN
15.0000 mL | Freq: Once | OROMUCOSAL | Status: AC
  Administered 2020-05-16: 15 mL via ORAL
  Filled 2020-05-15: qty 15

## 2020-05-15 MED ORDER — MORPHINE SULFATE (PF) 2 MG/ML IV SOLN
2.0000 mg | Freq: Once | INTRAVENOUS | Status: AC
  Administered 2020-05-15: 2 mg via INTRAVENOUS
  Filled 2020-05-15: qty 1

## 2020-05-15 MED ORDER — ONDANSETRON HCL 4 MG/2ML IJ SOLN: 4.0000 mg | Freq: Four times a day (QID) | INTRAMUSCULAR | Status: DC | PRN

## 2020-05-15 MED ORDER — MORPHINE SULFATE (PF) 2 MG/ML IV SOLN
2.0000 mg | INTRAVENOUS | Status: DC | PRN
  Administered 2020-05-15 – 2020-05-17 (×5): 2 mg via INTRAVENOUS
  Filled 2020-05-15 (×5): qty 1

## 2020-05-15 MED ORDER — DILTIAZEM HCL ER COATED BEADS 120 MG PO CP24
120.0000 mg | ORAL_CAPSULE | Freq: Every day | ORAL | Status: DC
  Administered 2020-05-16 – 2020-05-17 (×2): 120 mg via ORAL
  Filled 2020-05-15 (×2): qty 1

## 2020-05-15 MED ORDER — ALUM & MAG HYDROXIDE-SIMETH 200-200-20 MG/5ML PO SUSP
30.0000 mL | Freq: Once | ORAL | Status: AC
  Administered 2020-05-16: 30 mL via ORAL
  Filled 2020-05-15: qty 30

## 2020-05-15 MED ORDER — LISINOPRIL 10 MG PO TABS
20.0000 mg | ORAL_TABLET | Freq: Every day | ORAL | Status: DC
  Administered 2020-05-16 – 2020-05-17 (×2): 20 mg via ORAL
  Filled 2020-05-15 (×2): qty 2

## 2020-05-15 MED ORDER — ONDANSETRON HCL 4 MG/2ML IJ SOLN
4.0000 mg | Freq: Once | INTRAMUSCULAR | Status: AC
  Administered 2020-05-15: 4 mg via INTRAVENOUS
  Filled 2020-05-15: qty 2

## 2020-05-15 NOTE — H&P (Signed)
History and Physical    PLEASE NOTE THAT DRAGON DICTATION SOFTWARE WAS USED IN THE CONSTRUCTION OF THIS NOTE.   Leslie Johnston EEF:[id number] DOB: 02-Aug-1925 DOA: 05/15/2020  PCP: System, Provider Not In Patient coming from: home   I have personally briefly reviewed patient's old medical records in [place]  Chief Complaint: Epigastric pain  HPI: Leslie Johnston is a 87 y.o. female with medical history significant for paroxysmal atrial fibrillation chronically anticoagulated on Eliquis, hypertension, GERD: Chronic low back pain in the setting of degenerative disc disease, T12-L1 compression fracture status post T12-L1 kyphoplasty in November 2019, hiatal hernia, who is admitted to [facility] on 05/15/2020 with acute epigastric pain after presenting from home to [facility] ED complaining of such.   The following history is provided by the patient, as well as my discussions with the patient's daughter, who was present at bedside, in addition to my discussions with the emergency department physician and via chart review.  The patient reports 1 day of nearly constant epigastric discomfort.  She describes the pain as sharp with radiation into the back.  She notes exacerbation in a postprandial timeframe, noting worsening of the intensity associated epigastric discomfort approximately 15 to 20 minutes after eating, but denies any associated nausea/vomiting.  She also notes exacerbation with palpation over the abdomen as well as with movement.  Denies any recent trauma.  Denies any associated diarrhea, hematochezia, melena, or rash.  No recent traveling.  Not associate with any chest pain, palpitations, diaphoresis, shortness of breath, dizziness, presyncope, or syncope.  Denies any recent wheezing, cough, or known COVID-19 exposures.  She also denies any urinary symptoms, including no recent dysuria, gross hematuria, or change in urinary urgency/frequency.  No flank  tenderness.  She conveys that she experienced 1 similar prior episode in February 2022, and emphasizes that this prior episode occurred prior to being started on chronic anticoagulation with Eliquis for new diagnosis of paroxysmal atrial fibrillation prompting hospitalization at [facility] from 02/10/2020 02/12/2020.  She reports that this previous episode in February 2022 with self-limited, resolving spontaneously within a few days of onset.  aside from Eliquis, she denies taking any blood thinners at home, including aspirin.  Additionally, she denies any use of NSAIDs at home including the use of ibuprofen or meloxicam.  In reviewing her prior outpatient medication list, it appears that she was previously prescribed oral diclofenac on a scheduled basis.  The patient denies recently taking this medication, and a bag of all of the patient's home medications was presented to me by the patient's daughter, and did not include any diclofenac.  Denies any alcohol consumption.  She knowledges a history of GERD, but denies any known history of esophagitis, gastritis, or peptic ulcer disease.  No history of gastrointestinal bleed.   She knowledges a history of chronic low back pain in the setting of degenerative disc disease as well as prior compression fracture status post T12-L1 kyphoplasty performed in November 2019.  In the setting of his chronic low back pain, she follows with Dr. Girtha Hake, physiatrist.  Patient reports that she has been receiving back injections via Dr. Alba Destine, which she believes to be steroidal in nature, and conveys that she is scheduled for her next back injection tomorrow (05/16/20).  Patient also has a documented history of osteoporosis.   In setting of her chronic low back pain, she underwent MRI of the lumbar spine without contrast on 03/31/2020, with results that included the following: Remote T12  and L1 compression fracture status post kyphoplasty, with very mild hyperintensity along the  superior T12 endplate potentially representing an unhealed component as well as approximately 4 mm of bony retropulsion along the superior endplate at both levels with retropulsed bone flattening the ventral cord at T11-T12 and deforming the right ventral cord/conus at T12-L1. Resulting canal stenosis is mild at T11-T12 and mild-to-moderate at T12-L1. Mild bilateral foraminal stenosis at both levels.  Additionally, MRI of the lumbar spine showed moderate right and mild left foraminal stenosis on the right L4-L5 as well as mild foraminal stenosis bilaterally at L3-L4 and on the left at L4-L5.  Mild canal stenosis noted at L2-L3.       ED Course:  Vital signs in the ED were notable for the following: Tetramex 98.3, heart rate 8888; blood pressure 129/81 - 143/80; respiratory 16-22; oxygen saturation 95 to 96% on room air.    Labs were notable for the following: CMP was notable for the following bicarbonate 26, anion gap 7, BUN 17 compared to most recent prior BUN value of 16 on 02/27/2018, creatinine 0.75,,albumin 3.5, alkaline phosphatase 72, AST 24, ALT 12, total bilirubin 0.6.  Lipase 33.  CBC notable for white blood cell count 8400, hemoglobin 12.6 relative to most recent prior value 13.6 on 02/28/2020, was associated with normocytic/normochromic findings as well as nonelevated RDW.  Urinalysis notable for no white blood cells, no bacteria, nitrate negative, leukocyte Estrace negative.  High-sensitivity troponin I x1 found to be nonelevated at 5.  Nasopharyngeal COVID-19/influenza PCR were checked and ED today, with result currently pending.  EKG, but with comparison to most recent prior from 04/05/2020 showed sinus rhythm with heart rate 85, left anterior fascicular block, and no evidence of T wave or ST changes, including no evidence of ST elevation, with Q waves in V1/V2 also noted on most recent prior EKG.  CT abdomen/pelvis without contrast, in comparison to CT abdomen/pelvis on 02/28/2020, showed no  evidence of acute intra-abdominal process.  Today CT abdomen/pelvis showed cholelithiasis without evidence of acute cholecystitis, choledocholithiasis, or dilation of the common bile duct.  This imaging also showed a moderate hiatal hernia, unchanged from most recent prior imaging, while showing diverticulosis without evidence of diverticulitis.  Today's CT abdomen/pelvis showed chronic multilevel vertebral body height loss of the lower thoracic/lumbar spine without significant change from most recent prior imaging, and also noted prior T12/L1 kyphoplasty.  Today CT abdomen/pelvis showed no evidence of abdominal aortic aneurysm, no evidence of bowel obstruction, abscess, or perforation, and showed no evidence of nephrolithiasis or hydronephrosis.  While in the ED, the following were administered: Morphine 2 mg IV x2.  With ongoing epigastric discomfort following these 2 doses of morphine, the patient was subsequently admitted for overnight observation to the [facility] floor for further evaluation and management of her presenting acute epigastric pain including for optimization of symptomatic management as well as further evaluation of underlying etiology.    Review of Systems: As per HPI otherwise 10 point review of systems negative.   Past Medical History:  Diagnosis Date  . A-fib ([place])   . Allergic rhinitis   . Allergy   . Chronic low back pain   . GERD (gastroesophageal reflux disease)   . Hypertension     Past Surgical History:  Procedure Laterality Date  . EYE SURGERY     cataract extraction- Right  . KYPHOPLASTY N/A 11/26/2017   Procedure: [id number],P3;  Surgeon: Hessie Knows, MD;  Location: [facility] ORS;  Service: Orthopedics;  Laterality:  N/A;    Social History:  reports that she has never smoked. She has never used smokeless tobacco. She reports that she does not drink alcohol and does not use drugs.   Allergies  Allergen Reactions  . Amlodipine Swelling  . Losartan Itching   . Penicillins Hives and Other (See Comments)    Has patient had a PCN reaction causing immediate rash, facial/tongue/throat swelling, SOB or lightheadedness with hypotension: No Has patient had a PCN reaction causing severe rash involving mucus membranes or skin necrosis: No Has patient had a PCN reaction that required hospitalization: No Has patient had a PCN reaction occurring within the last 10 years: No If all of the above answers are "NO", then may proceed with Cephalosporin use.   . Tramadol Other (See Comments)    Confusion    Family History  Problem Relation Age of Onset  . Pancreatic cancer Sister   . Arthritis Maternal Grandmother   . Leukemia Maternal Grandfather   . Asthma Maternal Grandfather   . Breast cancer Daughter   . Bladder Cancer Daughter      Prior to Admission medications   Medication Sig Start Date End Date Taking? Authorizing Provider  acetaminophen (TYLENOL) 650 MG CR tablet Take 650 mg by mouth every 8 (eight) hours as needed for pain.    [provider]  alum & mag hydroxide-simeth (MAALOX/MYLANTA) 200-200-20 MG/5ML suspension Take by mouth every 6 (six) hours as needed for indigestion or heartburn.    [provider]  apixaban (ELIQUIS) 2.5 MG TABS tablet Take 1 tablet (2.5 mg total) by mouth 2 (two) times daily. 02/23/20   Furth, Cadence H, PA-C  diltiazem (CARDIZEM CD) 120 MG 24 hr capsule Take 1 capsule (120 mg total) by mouth daily. 02/23/20   Furth, Cadence H, PA-C  gabapentin (NEURONTIN) 100 MG capsule Start 1 capsule daily, increase by 1 cap every 2-3 days as tolerated up to 3 times a day, or may take 3 at once in evening. 03/08/20   Karamalegos, Devonne Doughty, DO  lisinopril (ZESTRIL) 20 MG tablet TAKE 1 TABLET(20 MG) BY MOUTH DAILY 04/27/20   Parks Ranger, Devonne Doughty, DO  mirtazapine (REMERON) 7.5 MG tablet TAKE 1 TABLET(7.5 MG) BY MOUTH AT BEDTIME 04/19/20   Karamalegos, Devonne Doughty, DO  Multiple Vitamin (MULTIVITAMIN WITH MINERALS)  TABS tablet Take 1 tablet by mouth daily with lunch.     [provider]  Multiple Vitamins-Minerals (PRESERVISION AREDS 2+MULTI VIT PO) Take by mouth.    [provider]  pantoprazole (PROTONIX) 40 MG tablet TAKE 1 TABLET(40 MG) BY MOUTH DAILY BEFORE BREAKFAST 04/19/20   Olin Hauser, DO     Objective    Physical Exam: Vitals:   05/15/20 1730 05/15/20 1815 05/15/20 1830 05/15/20 1925  BP: 129/81  (!) 143/80 139/82  Pulse: 80 88 83 80  Resp: (!) 29 (!) 30 (!) 30 16  Temp:      TempSrc:      SpO2: 93% 93%  96%  Weight:      Height:        General: appears to be stated age; alert, oriented Skin: warm, dry, no rash Head:  AT/[place] Mouth:  Oral mucosa membranes appear moist, normal dentition Neck: supple; trachea midline Heart:  RRR; did not appreciate any M/R/G Lungs: CTAB, did not appreciate any wheezes, rales, or rhonchi Abdomen: + BS; soft, ND; mild tenderness over the epigastrium as well as the right upper quadrant and left upper quadrant, in the  absence of any associated guarding, rigidity, or rebound tenderness. Vascular: 2+ pedal pulses b/l; 2+ radial pulses b/l Extremities: no peripheral edema, no muscle wasting Neuro: strength and sensation intact in upper and lower extremities b/l    Labs on Admission: I have personally reviewed following labs and imaging studies  CBC: Recent Labs  Lab 05/15/20 1659  WBC 8.4  HGB 12.6  HCT 40.2  MCV 88.2  PLT 426   Basic Metabolic Panel: Recent Labs  Lab 05/15/20 1811  NA 138  K 4.3  CL 105  CO2 26  GLUCOSE 116*  BUN 15  CREATININE 0.75  CALCIUM 9.3   GFR: Estimated Creatinine Clearance: 43.4 mL/min (by C-G formula based on SCr of 0.75 mg/dL). Liver Function Tests: Recent Labs  Lab 05/15/20 1811  AST 24  ALT 12  ALKPHOS 72  BILITOT 0.6  PROT 6.5  ALBUMIN 3.5   Recent Labs  Lab 05/15/20 1811  LIPASE 33   No results for input(s): AMMONIA in the last 168 hours. Coagulation  Profile: No results for input(s): INR, PROTIME in the last 168 hours. Cardiac Enzymes: No results for input(s): CKTOTAL, CKMB, CKMBINDEX, TROPONINI in the last 168 hours. BNP (last 3 results) No results for input(s): PROBNP in the last 8760 hours. HbA1C: No results for input(s): HGBA1C in the last 72 hours. CBG: No results for input(s): GLUCAP in the last 168 hours. Lipid Profile: No results for input(s): CHOL, HDL, LDLCALC, TRIG, CHOLHDL, LDLDIRECT in the last 72 hours. Thyroid Function Tests: No results for input(s): TSH, T4TOTAL, FREET4, T3FREE, THYROIDAB in the last 72 hours. Anemia Panel: No results for input(s): VITAMINB12, FOLATE, FERRITIN, TIBC, IRON, RETICCTPCT in the last 72 hours. Urine analysis:    Component Value Date/Time   COLORURINE STRAW (A) 05/15/2020 1811   APPEARANCEUR CLEAR (A) 05/15/2020 1811   LABSPEC 1.004 (L) 05/15/2020 1811   PHURINE 8.0 05/15/2020 1811   GLUCOSEU NEGATIVE 05/15/2020 1811   HGBUR NEGATIVE 05/15/2020 1811   BILIRUBINUR NEGATIVE 05/15/2020 1811   BILIRUBINUR neg 04/15/2019 [place] 05/15/2020 1811   PROTEINUR NEGATIVE 05/15/2020 1811   UROBILINOGEN 0.2 04/15/2019 1716   NITRITE NEGATIVE 05/15/2020 1811   LEUKOCYTESUR NEGATIVE 05/15/2020 1811    Radiological Exams on Admission: CT ABDOMEN PELVIS WO CONTRAST  Result Date: 05/15/2020 CLINICAL DATA:  Upper abdominal pain that radiates to the back since Friday EXAM: CT ABDOMEN AND PELVIS WITHOUT CONTRAST TECHNIQUE: Multidetector CT imaging of the abdomen and pelvis was performed following the standard protocol without IV contrast. COMPARISON:  February 28, 2020 FINDINGS: Lower chest: Scarring versus atelectasis in the lung bases. Moderate hiatal hernia. Hepatobiliary: Unremarkable noncontrast appearance of the hepatic parenchyma. Calcified gallstones without findings of acute cholecystitis. No biliary ductal dilation. Pancreas: Within normal limits. Spleen: Peripheral  calcifications within a 1.2 cm hypodense splenic lesion, likely sequela prior infection or trauma. Adrenals/Urinary Tract: Bilateral adrenal glands are unremarkable. Bilateral hypodense renal lesions measuring up to 3.1 cm on the right and 2.3 cm on the left the incompletely evaluated on this single-phase study but favored represent cysts. No hydronephrosis. No nephrolithiasis. Urinary bladder is grossly unremarkable for degree of distension. Stomach/Bowel: Moderate hiatal hernia otherwise the stomach is grossly unremarkable for degree of distension. Normal positioning of the duodenum/ligament of [place]. Duodenal diverticulum. No pathologic dilation of small bowel. The appendix is not definitely visualized however there is no pericecal inflammation. Colonic diverticulosis without findings of acute diverticulitis. Vascular/Lymphatic: Aortic and branch vessel atherosclerosis. No abdominal aortic aneurysm.  No pathologically enlarged abdominal or pelvic lymph nodes. Reproductive: Uterine leiomyoma.  No suspicious adnexal lesions. Other: No abdominopelvic ascites.  No pneumoperitoneum. Musculoskeletal: Diffuse demineralization of bone. No rest of lytic or blastic lesions of bone. Multilevel degenerative changes spine. T12 and L1 kyphoplasty. No acute osseous abnormality. IMPRESSION: 1. No acute findings in the abdomen or pelvis. 2. Cholelithiasis without findings of acute cholecystitis. 3. Moderate hiatal hernia. 4. Colonic diverticulosis without findings of acute diverticulitis. 5. Uterine leiomyoma. 6. Chronic appearing multilevel vertebral body height loss of the visualized lower thoracic and lumbar spine with prior T12 and L1 kyphoplasties. Aortic Atherosclerosis (ICD10-I70.0). Electronically Signed   By: Dahlia Bailiff MD   On: 05/15/2020 19:09     EKG: Independently reviewed, with result as described above.    Assessment/Plan   ADEN VREDEVELD is a 87 y.o. female with medical history significant for  paroxysmal atrial fibrillation chronically anticoagulated on Eliquis, hypertension, GERD: Chronic low back pain in the setting of degenerative disc disease, T12-L1 compression fracture status post T12-L1 kyphoplasty in November 2019, hiatal hernia, who is admitted to [facility] on 05/15/2020 with acute epigastric pain after presenting from home to [facility] ED complaining of such.    Principal Problem:   Acute epigastric pain Active Problems:   A-fib ([facility])   GERD (gastroesophageal reflux disease)   Hypertension     #) Acute epigastric pain: 1 day of nearly constant sharp epigastric pain with radiation to the back and exacerbation with palpation, movement, and in a 15-20 minute postprandial time.  This is not 1 prior episode of nearly identical epigastric discomfort in early February 2022, prior to the initiation of Eliquis, which self-limited, without any residual reported abdominal discomfort until return of pain within the last 1 day.  Etiology currently unclear.  CT abdomen/pelvis performed today showed no evidence of acute intra-abdominal process, while showing cholelithiasis without evidence of acute cholecystitis, choledocholithiasis, or common bile duct.  Additionally, liver enzymes are not suggestive of an underlying cholestatic pattern.  It is possible that the patient may be passing a radiolucent nonobstructive gallstone contributing to her presenting acute pain, given postprandial exacerbation of her symptoms.  will further evaluate with right upper quadrant ultrasound and repeat CMP in the morning to evaluate for any interval change in liver enzymes, including evaluating for development of a cholestatic pattern.  In setting of postprandial exacerbation, differentials includes gastritis as well as peptic ulcer disease. Will try a dose of GI cocktail with viscous lidocaine and assess for response to this cocktail for both therapeutic and diagnostic purposes.  Of note, CT  abdomen/pelvis today also showed no evidence of nephrolithiasis, or bowel obstruction.  No radiographic evidence of acute pancreatitis, while lipase is nonelevated.  Presentation appears less suggestive of ACS, including no associated symptoms that could represent anginal equivalents, while high-sensitivity troponin has been nonelevated and presenting EKG shows no evidence of acute schema changes.  In the setting of radiation of her epigastric pain into the back, today CT abdomen/pelvis was notable for no evidence of abdominal aortic aneurysm or dissection.  Additionally, given this radiation to the back, consider the possibility of reticular referral in the setting of the known history of multilevel thoracic/lumbar degenerative disc disease including multilevel mild to moderate foraminal stenosis, as further detailed above.  Given that the patient has been receiving regular back injections via physiatrist as management of her chronic low back pain in this context, and that she is due for her next such injection,  with outpatient appointment present scheduled for tomorrow, the timing the onset of her presenting epigastric pain could be consistent with this relative sequence involving timeframe of most recent injection preventing recurrence of prior episode of acute epigastric pain that occurred in Feb '22.  While appearing less likely at this time, differential for pain on proportion of physical exam includes mesenteric ischemia.  Will check LDH to further assess.,  Today CT abdomen/pelvis showed no evidence of colitis, including no evidence of bowel inflammation in a watershed distribution, and the patient denies any recent hematochezia.  Overall, this possibility appears less likely at the present time.  In general, will pursue right upper quadrant ultrasound at this time, which, if nondiagnostic, can consider pursuit of MRI of the lumbar spine to further evaluate for radicular source in the setting of known  underlying mild to moderate foraminal stenosis of the lower Thoracic and lumbar spine.  Differential also includes hiatal hernia, although this appears unchanged relative to CT abdomen/pelvis performed in March 2022.    Plan: Right lower quadrant ultrasound.  Repeat CMP in the morning.  Repeat lipase at that time as well.  Check LDH, as above.  IV Protonix.  GI cocktail with viscous lidocaine x 1 dose for therapeutic and diagnostic purposes, as further detailed above.  Repeat CBC in the morning.  As needed IV morphine.  As needed IV Zofran.  Also repeat troponin value to further rule out atypical acs presentation. NPO with sips/meds for now.      #) Essential hypertension: Outpatient antihypertensive regimen consists of lisinopril as well as diltiazem, with the latter inclusion in the setting of history of paroxysmal atrial fibrillation.  Systolic blood pressures in the ED have been in the 120s to 140s in the absence of any hypotension.  Plan: Close monitoring simple pressure via routine vital signs.  Resume home antihypertensive medications.      #) Paroxysmal atrial fibrillation: Documented history of such. In the setting of a CHA2DS2-VASc score of 4, there is an indication for the patient to be on chronic anticoagulation for thromboembolic prophylaxis. Consistent with this, the patient is chronically anticoagulated on Eliquis. Home AV nodal blocking regimen: Diltiazem.  Presenting EKG demonstrates sinus rhythm.    Plan: monitor strict I's & O's and daily weights. Repeat BMP and CBC in the morning. Check serum magnesium level. Continue home AV nodal blocking regimen, as above.  Continue home Eliquis.       #) GERD: On Protonix as an outpatient.  In the setting of postprandial exacerbation of her presenting acute epigastric pain, will transition to IV Protonix for now.  Plan: Transition to daily IV Protonix for now, as above.     DVT prophylaxis: Continue home Eliquis, SCDs Code  Status: Full code Family Communication: Patient's case was discussed with her daughter, who is present at bedside Disposition Plan: Per Rounding Team Consults called: none  Admission status: Observation; MedSurg     Of note, this patient was added by me to the following Admit List/Treatment Team: [facility].      PLEASE NOTE THAT DRAGON DICTATION SOFTWARE WAS USED IN THE CONSTRUCTION OF THIS NOTE.   Rhetta Mura DO [facility] Pager: Radiology From [facility]  Otherwise, please contact night-coverage  [email or web address] Password [facility]   05/15/2020, 8:18 PM

## 2020-05-15 NOTE — ED Triage Notes (Signed)
Pt arrives via EMS from home- pt has been having upper abdominal pain that radiates to the back since Friday- pt had a similar episode 3 months ago and was diagnosed with afib- pt denies n/v/d- pt has tried mylanta with little improvement- vss per EMS

## 2020-05-15 NOTE — ED Provider Notes (Signed)
[facility] Emergency Department Provider Note   ____________________________________________    I have reviewed the triage vital signs and the nursing notes.   HISTORY  Chief Complaint Abdominal Pain     HPI Leslie Johnston is a 87 y.o. female with a history as noted below who presents with complaints of abdominal pain.  Patient describes intermittent sharp and severe epigastric pain which has been ongoing over the last 12 hours.  Apparently she had an episode of this 3 weeks ago which resolved on its own.  She complains of occasional nausea, none currently.  Normal stools.  No fevers chills.  No new medications.  Past Medical History:  Diagnosis Date  . A-fib ([place])   . Allergic rhinitis   . Allergy   . Chronic low back pain   . GERD (gastroesophageal reflux disease)   . Hypertension     Patient Active Problem List   Diagnosis Date Noted  . Acute epigastric pain 05/15/2020  . A-fib ([place])   . GERD (gastroesophageal reflux disease)   . Hypertension   . Chronic bilateral low back pain without sciatica 03/08/2020  . Atrial fibrillation with RVR ([place]) 02/10/2020  . OAB (overactive bladder) 04/15/2019  . Dysthymia 04/15/2019  . Postmenopausal osteoporosis with pathological fracture of humerus 09/13/2016  . Humerus fracture 08/29/2016  . Chronic seasonal allergic rhinitis 03/20/2016  . Abnormal chest x-ray 09/19/2015  . Benign hypertension with CKD (chronic kidney disease) stage III 07/27/2014    Past Surgical History:  Procedure Laterality Date  . EYE SURGERY     cataract extraction- Right  . KYPHOPLASTY N/A 11/26/2017   Procedure: [id number];  Surgeon: Hessie Knows, MD;  Location: [facility] ORS;  Service: Orthopedics;  Laterality: N/A;    Prior to Admission medications   Medication Sig Start Date End Date Taking? Authorizing Provider  acetaminophen (TYLENOL) 650 MG CR tablet Take 650 mg by mouth every 8 (eight) hours as needed  for pain.    [provider]  alum & mag hydroxide-simeth (MAALOX/MYLANTA) 200-200-20 MG/5ML suspension Take by mouth every 6 (six) hours as needed for indigestion or heartburn.    [provider]  apixaban (ELIQUIS) 2.5 MG TABS tablet Take 1 tablet (2.5 mg total) by mouth 2 (two) times daily. 02/23/20   Furth, Cadence H, PA-C  diltiazem (CARDIZEM CD) 120 MG 24 hr capsule Take 1 capsule (120 mg total) by mouth daily. 02/23/20   Furth, Cadence H, PA-C  gabapentin (NEURONTIN) 100 MG capsule Start 1 capsule daily, increase by 1 cap every 2-3 days as tolerated up to 3 times a day, or may take 3 at once in evening. 03/08/20   Karamalegos, Devonne Doughty, DO  lisinopril (ZESTRIL) 20 MG tablet TAKE 1 TABLET(20 MG) BY MOUTH DAILY 04/27/20   Parks Ranger, Devonne Doughty, DO  mirtazapine (REMERON) 7.5 MG tablet TAKE 1 TABLET(7.5 MG) BY MOUTH AT BEDTIME 04/19/20   Karamalegos, Devonne Doughty, DO  Multiple Vitamin (MULTIVITAMIN WITH MINERALS) TABS tablet Take 1 tablet by mouth daily with lunch.     [provider]  Multiple Vitamins-Minerals (PRESERVISION AREDS 2+MULTI VIT PO) Take by mouth.    [provider]  pantoprazole (PROTONIX) 40 MG tablet TAKE 1 TABLET(40 MG) BY MOUTH DAILY BEFORE BREAKFAST 04/19/20   Karamalegos, Devonne Doughty, DO     Allergies Amlodipine, Losartan, Penicillins, and Tramadol  Family History  Problem Relation Age of Onset  . Pancreatic cancer Sister   . Arthritis Maternal Grandmother   .  Leukemia Maternal Grandfather   . Asthma Maternal Grandfather   . Breast cancer Daughter   . Bladder Cancer Daughter     Social History Social History   Tobacco Use  . Smoking status: Never Smoker  . Smokeless tobacco: Never Used  Vaping Use  . Vaping Use: Never used  Substance Use Topics  . Alcohol use: No    Alcohol/week: 0.0 standard drinks  . Drug use: No    Review of Systems  Constitutional: No fever/chills Eyes: No visual changes.  ENT: No sore  throat. Cardiovascular: Denies chest pain. Respiratory: Denies shortness of breath. Gastrointestinal: As above Genitourinary: Negative for dysuria. Musculoskeletal: Negative for back pain. Skin: Negative for rash. Neurological: Negative for headaches or weakness   ____________________________________________   PHYSICAL EXAM:  VITAL SIGNS: ED Triage Vitals  Enc Vitals Group     BP 05/15/20 1700 (!) 128/95     Pulse Rate 05/15/20 1655 82     Resp 05/15/20 1655 (!) 22     Temp 05/15/20 1655 98.3 F (36.8 C)     Temp Source 05/15/20 1655 Oral     SpO2 05/15/20 1655 95 %     Weight 05/15/20 1658 61.2 kg (135 lb)     Height 05/15/20 1658 1.702 m (5\' 7" )     Head Circumference --      Peak Flow --      Pain Score 05/15/20 1657 9     Pain Loc --      Pain Edu? --      Excl. in [place]? --     Constitutional: Alert and oriented.   Nose: No congestion/rhinnorhea. Mouth/Throat: Mucous membranes are moist.   Neck:  Painless ROM Cardiovascular: Normal rate, regular rhythm. Grossly normal heart sounds.  Good peripheral circulation. Respiratory: Normal respiratory effort.  No retractions.. Gastrointestinal: Soft and nontender. No distention.  Overall reassuring exam  Musculoskeletal: Warm and well perfused Neurologic:  Normal speech and language. No gross focal neurologic deficits are appreciated.  Skin:  Skin is warm, dry and intact. No rash noted. Psychiatric: Mood and affect are normal. Speech and behavior are normal.  ____________________________________________   LABS (all labs ordered are listed, but only abnormal results are displayed)  Labs Reviewed  URINALYSIS, COMPLETE (UACMP) WITH MICROSCOPIC - Abnormal; Notable for the following components:      Result Value   Color, Urine STRAW (*)    APPearance CLEAR (*)    Specific Gravity, Urine 1.004 (*)    All other components within normal limits  COMPREHENSIVE METABOLIC PANEL - Abnormal; Notable for the following  components:   Glucose, Bld 116 (*)    All other components within normal limits  SARS CORONAVIRUS 2 (TAT 6-24 HRS)  CBC  LIPASE, BLOOD  MAGNESIUM  LIPASE, BLOOD  MAGNESIUM  COMPREHENSIVE METABOLIC PANEL  CBC  LACTATE DEHYDROGENASE, ISOENZYMES  TROPONIN I (HIGH SENSITIVITY)  TROPONIN I (HIGH SENSITIVITY)   ____________________________________________  EKG  ED ECG REPORT I, Lavonia Drafts, the attending physician, personally viewed and interpreted this ECG.  Date: 05/15/2020  Rhythm: normal sinus rhythm QRS Axis: normal Intervals: Left anterior fascicular ST/T Wave abnormalities: normal Narrative Interpretation: no evidence of acute ischemia  ____________________________________________  RADIOLOGY  CT abdomen pelvis ____________________________________________   PROCEDURES  Procedure(s) performed: No  Procedures   Critical Care performed: No ____________________________________________   INITIAL IMPRESSION / ASSESSMENT AND PLAN / ED COURSE  Pertinent labs & imaging results that were available during my care of the patient were reviewed  by me and considered in my medical decision making (see chart for details).  Patient presents with epigastric pain as above.  Lab work is reassuring, LFTs lipase normal.  No significant tenderness over the right upper quadrant.  No fevers or chills.  Occasional nausea, none now.  Will treat with IV Zofran, low-dose IV morphine, obtain CT abdomen pelvis and reevaluate  Patient with continued pain and additional dose of morphine  CT scan without acute abnormality, however pain with continued pain, have discussed with the hospitalist for admission    ____________________________________________   FINAL CLINICAL IMPRESSION(S) / ED DIAGNOSES  Final diagnoses:  Epigastric pain        Note:  This document was prepared using Dragon voice recognition software and may include unintentional dictation errors.   Lavonia Drafts, MD 05/15/20 2156

## 2020-05-16 ENCOUNTER — Observation Stay: Payer: Medicare Other

## 2020-05-16 DIAGNOSIS — Z87311 Personal history of (healed) other pathological fracture: Secondary | ICD-10-CM | POA: Diagnosis not present

## 2020-05-16 DIAGNOSIS — K449 Diaphragmatic hernia without obstruction or gangrene: Secondary | ICD-10-CM | POA: Diagnosis present

## 2020-05-16 DIAGNOSIS — R64 Cachexia: Secondary | ICD-10-CM | POA: Diagnosis present

## 2020-05-16 DIAGNOSIS — Z20822 Contact with and (suspected) exposure to covid-19: Secondary | ICD-10-CM | POA: Diagnosis present

## 2020-05-16 DIAGNOSIS — R1013 Epigastric pain: Secondary | ICD-10-CM | POA: Diagnosis not present

## 2020-05-16 DIAGNOSIS — Z803 Family history of malignant neoplasm of breast: Secondary | ICD-10-CM | POA: Diagnosis not present

## 2020-05-16 DIAGNOSIS — K8020 Calculus of gallbladder without cholecystitis without obstruction: Secondary | ICD-10-CM | POA: Diagnosis present

## 2020-05-16 DIAGNOSIS — Z8261 Family history of arthritis: Secondary | ICD-10-CM | POA: Diagnosis not present

## 2020-05-16 DIAGNOSIS — K219 Gastro-esophageal reflux disease without esophagitis: Secondary | ICD-10-CM | POA: Diagnosis present

## 2020-05-16 DIAGNOSIS — Z8052 Family history of malignant neoplasm of bladder: Secondary | ICD-10-CM | POA: Diagnosis not present

## 2020-05-16 DIAGNOSIS — F0390 Unspecified dementia without behavioral disturbance: Secondary | ICD-10-CM | POA: Diagnosis present

## 2020-05-16 DIAGNOSIS — M5125 Other intervertebral disc displacement, thoracolumbar region: Secondary | ICD-10-CM | POA: Diagnosis not present

## 2020-05-16 DIAGNOSIS — I480 Paroxysmal atrial fibrillation: Secondary | ICD-10-CM | POA: Diagnosis present

## 2020-05-16 DIAGNOSIS — Z7901 Long term (current) use of anticoagulants: Secondary | ICD-10-CM | POA: Diagnosis not present

## 2020-05-16 DIAGNOSIS — R627 Adult failure to thrive: Secondary | ICD-10-CM | POA: Diagnosis present

## 2020-05-16 DIAGNOSIS — Z6820 Body mass index (BMI) 20.0-20.9, adult: Secondary | ICD-10-CM | POA: Diagnosis not present

## 2020-05-16 DIAGNOSIS — Z825 Family history of asthma and other chronic lower respiratory diseases: Secondary | ICD-10-CM | POA: Diagnosis not present

## 2020-05-16 DIAGNOSIS — G8929 Other chronic pain: Secondary | ICD-10-CM | POA: Diagnosis present

## 2020-05-16 DIAGNOSIS — I10 Essential (primary) hypertension: Secondary | ICD-10-CM | POA: Diagnosis present

## 2020-05-16 DIAGNOSIS — Z806 Family history of leukemia: Secondary | ICD-10-CM | POA: Diagnosis not present

## 2020-05-16 DIAGNOSIS — M8008XA Age-related osteoporosis with current pathological fracture, vertebra(e), initial encounter for fracture: Secondary | ICD-10-CM | POA: Diagnosis present

## 2020-05-16 DIAGNOSIS — Z800 Family history of malignant neoplasm of digestive organs: Secondary | ICD-10-CM | POA: Diagnosis not present

## 2020-05-16 LAB — COMPREHENSIVE METABOLIC PANEL
ALT: 11 U/L (ref 0–44)
AST: 18 U/L (ref 15–41)
Albumin: 3.3 g/dL — ABNORMAL LOW (ref 3.5–5.0)
Alkaline Phosphatase: 69 U/L (ref 38–126)
Anion gap: 5 (ref 5–15)
BUN: 14 mg/dL (ref 8–23)
CO2: 28 mmol/L (ref 22–32)
Calcium: 9.3 mg/dL (ref 8.9–10.3)
Chloride: 107 mmol/L (ref 98–111)
Creatinine, Ser: 0.71 mg/dL (ref 0.44–1.00)
GFR, Estimated: >60 mL/min (ref >60)
Glucose, Bld: 104 mg/dL — ABNORMAL HIGH (ref 70–99)
Potassium: 4.2 mmol/L (ref 3.5–5.1)
Sodium: 140 mmol/L (ref 135–145)
Total Bilirubin: 0.7 mg/dL (ref 0.3–1.2)
Total Protein: 6.3 g/dL — ABNORMAL LOW (ref 6.5–8.1)

## 2020-05-16 LAB — CBC
HCT: 39 % (ref 36.0–46.0)
Hemoglobin: 12.2 g/dL (ref 12.0–15.0)
MCH: 27.9 pg (ref 26.0–34.0)
MCHC: 31.3 g/dL (ref 30.0–36.0)
MCV: 89 fL (ref 80.0–100.0)
Platelets: 209 10*3/uL (ref 150–400)
RBC: 4.38 MIL/uL (ref 3.87–5.11)
RDW: 13.4 % (ref 11.5–15.5)
WBC: 6.7 10*3/uL (ref 4.0–10.5)
nRBC: 0 % (ref 0.0–0.2)

## 2020-05-16 LAB — LIPASE, BLOOD: Lipase: 54 U/L — ABNORMAL HIGH (ref 11–51)

## 2020-05-16 LAB — SARS CORONAVIRUS 2 (TAT 6-24 HRS): SARS Coronavirus 2: NEGATIVE

## 2020-05-16 LAB — MAGNESIUM: Magnesium: 2.3 mg/dL (ref 1.7–2.4)

## 2020-05-16 MED ORDER — OXYCODONE-ACETAMINOPHEN 5-325 MG PO TABS
1.0000 | ORAL_TABLET | ORAL | Status: DC | PRN
Start: 2020-05-16 — End: 2020-05-18
  Administered 2020-05-16 – 2020-05-17 (×3): 1 via ORAL
  Filled 2020-05-16 (×4): qty 1

## 2020-05-16 MED ORDER — GADOBUTROL 1 MMOL/ML IV SOLN
5.0000 mL | Freq: Once | INTRAVENOUS | Status: AC | PRN
  Administered 2020-05-16: 5 mL via INTRAVENOUS

## 2020-05-16 NOTE — Progress Notes (Signed)
[place] [facility]) Hospital Liaison note:  This patient is currently enrolled in [facility] outpatient-based Palliative Care. Will continue to follow for disposition.  Please call with any outpatient palliative questions or concerns.  Thank you, Lorelee Market, LPN [facility] Liaison [phone]

## 2020-05-16 NOTE — Progress Notes (Signed)
PROGRESS NOTE   Leslie Johnston  WFU:[id number] DOB: 1925-04-05 DOA: 05/15/2020 PCP: System, Provider Not In  Brief Narrative:  19 white female community dwelling P A. fib CHADS2 score >5/Eliquis (diagnosed 02/10/2020) Kyphoplasty 11/2017 T12--L1-follows with physiatry Dr. Palma Holter to have [facility] 05/16/2020 Prior pathological fracture humerus Also follows with [facility] care in the home for symptom management Possible dementia  Came to ED with severe abdominal/back pain-labs essentially normal WBC 8400 EKG benign CT abdomen pelvis = hiatal hernia diverticulosis DDx =?  Cholelithiasis  RUQ ultrasound cholelithiasis without cholecystitis Hemoglobin 12.2, WBC 6.7, platelet 209  BUN/creatinine 15/0.7-->614/0.7 Lipase 33-->54  Hospital-Problem based course Back and abdominal pain Patient has point tenderness to lumbar spine She can straight leg raise however I will get imaging of her back with MRI-Place thoracolumbar corset when out of bed Pain control with oxycodone first choice every 4 as needed and morphine for severe pain Allow regular diet.today Dr. Lacinda Axon neurosurgery will see in consult Abdominal pain Allow diet-has had no history according to daughter of any nausea or vomiting at home and is not having diarrhea Feel this is probably more related to her hiatal hernia Lipase is coincidental If however patient starts to have abdominal discomfort and/or vomiting we will ask GI to see Paroxysmal A. fib CHADS2 score >4 diagnosed 2022 Holding Eliquis at this time in anticipation of possible procedure Rate controlled and actually in sinus now-continue Cardizem 120 daily HTN Continue lisinopril Adult failure to thrive in the setting of mild dementia "eldest -old" Followed by hospice in the outpatient setting presumably for symptom management We will asked them to comment    DVT prophylaxis: SCD Code Status: Full Family Communication: Discussed in detail with  the patient's daughter [name] at the bedside Disposition:  Status is: Observation  The patient will require care spanning > 2 midnights and should be moved to inpatient because: Hemodynamically unstable, Ongoing diagnostic testing needed not appropriate for outpatient work up, Unsafe d/c plan and IV treatments appropriate due to intensity of illness or inability to take PO  Dispo: The patient is from: Home              Anticipated d/c is to: SNF              Patient currently is not medically stable to d/c.   Difficult to place patient No  Consultants:   Dr Lacinda Axon NS  Procedures: MRI pending  Antimicrobials: n    Subjective: Poor historian Cannot really get a sense as to if she is having significant abdominal pain or not-ancillary history from the daughter is as above she has not had any nausea vomiting or other issues and is hungry leading me to think this is not gastrointestinal She does have a known history of hiatal hernia   Objective: Vitals:   05/16/20 0519 05/16/20 0811 05/16/20 1117 05/16/20 1118  BP:  (!) 157/109 (!) 95/59 129/79  Pulse:  93 85 83  Resp:   16   Temp:  98.2 F (36.8 C) 98.5 F (36.9 C)   TempSrc:  Oral    SpO2:  94% (!) 89% 91%  Weight: 56.2 kg     Height:       No intake or output data in the 24 hours ending 05/16/20 1144 Filed Weights   05/15/20 1658 05/15/20 2233 05/16/20 0519  Weight: 61.2 kg 54.9 kg 56.2 kg    Examination: Cachectic white female no distress bitemporal wasting No icterus no pallor Slightly confused  and quite hard of hearing Albumin soft on my exam Straight leg raise bilaterally equal Reflexes deferred Sensory grossly intact but poor exam given patient's limited responses Point tenderness in mid back   Data Reviewed: personally reviewed   CBC    Component Value Date/Time   WBC 6.7 05/16/2020 0400   RBC 4.38 05/16/2020 0400   HGB 12.2 05/16/2020 0400   HCT 39.0 05/16/2020 0400   PLT 209 05/16/2020 0400   MCV  89.0 05/16/2020 0400   MCH 27.9 05/16/2020 0400   MCHC 31.3 05/16/2020 0400   RDW 13.4 05/16/2020 0400   LYMPHSABS 1,370 04/23/2019 1111   MONOABS 0.6 08/28/2016 2220   EOSABS 260 04/23/2019 1111   BASOSABS 50 04/23/2019 1111   CMP Latest Ref Rng & Units 05/16/2020 05/15/2020 02/28/2020  Glucose 70 - 99 mg/dL 104(H) 116(H) 111(H)  BUN 8 - 23 mg/dL 14 15 16   Creatinine 0.44 - 1.00 mg/dL 0.71 0.75 0.81  Sodium 135 - 145 mmol/L 140 138 140  Potassium 3.5 - 5.1 mmol/L 4.2 4.3 4.4  Chloride 98 - 111 mmol/L 107 105 107  CO2 22 - 32 mmol/L 28 26 26   Calcium 8.9 - 10.3 mg/dL 9.3 9.3 10.0  Total Protein 6.5 - 8.1 g/dL 6.3(L) 6.5 7.5  Total Bilirubin 0.3 - 1.2 mg/dL 0.7 0.6 0.6  Alkaline Phos 38 - 126 U/L 69 72 76  AST 15 - 41 U/L 18 24 17   ALT 0 - 44 U/L 11 12 10      Radiology Studies: CT ABDOMEN PELVIS WO CONTRAST  Result Date: 05/15/2020 CLINICAL DATA:  Upper abdominal pain that radiates to the back since Friday EXAM: CT ABDOMEN AND PELVIS WITHOUT CONTRAST TECHNIQUE: Multidetector CT imaging of the abdomen and pelvis was performed following the standard protocol without IV contrast. COMPARISON:  February 28, 2020 FINDINGS: Lower chest: Scarring versus atelectasis in the lung bases. Moderate hiatal hernia. Hepatobiliary: Unremarkable noncontrast appearance of the hepatic parenchyma. Calcified gallstones without findings of acute cholecystitis. No biliary ductal dilation. Pancreas: Within normal limits. Spleen: Peripheral calcifications within a 1.2 cm hypodense splenic lesion, likely sequela prior infection or trauma. Adrenals/Urinary Tract: Bilateral adrenal glands are unremarkable. Bilateral hypodense renal lesions measuring up to 3.1 cm on the right and 2.3 cm on the left the incompletely evaluated on this single-phase study but favored represent cysts. No hydronephrosis. No nephrolithiasis. Urinary bladder is grossly unremarkable for degree of distension. Stomach/Bowel: Moderate hiatal hernia  otherwise the stomach is grossly unremarkable for degree of distension. Normal positioning of the duodenum/ligament of [place]. Duodenal diverticulum. No pathologic dilation of small bowel. The appendix is not definitely visualized however there is no pericecal inflammation. Colonic diverticulosis without findings of acute diverticulitis. Vascular/Lymphatic: Aortic and branch vessel atherosclerosis. No abdominal aortic aneurysm. No pathologically enlarged abdominal or pelvic lymph nodes. Reproductive: Uterine leiomyoma.  No suspicious adnexal lesions. Other: No abdominopelvic ascites.  No pneumoperitoneum. Musculoskeletal: Diffuse demineralization of bone. No rest of lytic or blastic lesions of bone. Multilevel degenerative changes spine. T12 and L1 kyphoplasty. No acute osseous abnormality. IMPRESSION: 1. No acute findings in the abdomen or pelvis. 2. Cholelithiasis without findings of acute cholecystitis. 3. Moderate hiatal hernia. 4. Colonic diverticulosis without findings of acute diverticulitis. 5. Uterine leiomyoma. 6. Chronic appearing multilevel vertebral body height loss of the visualized lower thoracic and lumbar spine with prior T12 and L1 kyphoplasties. Aortic Atherosclerosis (ICD10-I70.0). Electronically Signed   By: Dahlia Bailiff MD   On: 05/15/2020 19:09   US ABDOMEN LIMITED  RUQ (LIVER/GB)  Result Date: 05/15/2020 CLINICAL DATA:  Acute epigastric pain EXAM: ULTRASOUND ABDOMEN LIMITED RIGHT UPPER QUADRANT COMPARISON:  Same day CT abdomen pelvis FINDINGS: Gallbladder: Cholelithiasis in the gallbladder neck measuring up to 7 mm. No pericholecystic fluid or wall thickening visualized. No sonographic Murphy sign noted by sonographer. Common bile duct: Diameter: 8 mm, within normal limits for patient's age. Liver: No focal lesion identified. Within normal limits in parenchymal echogenicity. Portal vein is patent on color Doppler imaging with normal direction of blood flow towards the liver. Other: None.  IMPRESSION: Cholelithiasis without other sonographic evidence of acute cholecystitis. Electronically Signed   By: Dahlia Bailiff MD   On: 05/15/2020 23:10     Scheduled Meds: . diltiazem  120 mg Oral Daily  . lisinopril  20 mg Oral Daily  . pantoprazole (PROTONIX) IV  40 mg Intravenous Q24H   Continuous Infusions:   LOS: 0 days   Time spent: [place], MD [facility] To contact the attending provider between 7A-7P or the covering provider during after hours 7P-7A, please log into the web site [email or web address] and access using universal [place] password for that web site. If you do not have the password, please call the hospital operator.  05/16/2020, 11:44 AM

## 2020-05-16 NOTE — Hospital Course (Signed)
5 white female community dwelling P A. fib CHADS2 score >5/Eliquis (diagnosed 02/10/2020) Kyphoplasty 11/2017 T12--L1-follows with physiatry Dr. Palma Holter to have [facility] 05/16/2020 Prior pathological fracture humerus Also follows with [facility] in the home for symptom management Possible dementia  Came to ED with severe abdominal/back pain-labs essentially normal WBC 8400 EKG benign CT abdomen pelvis = hiatal hernia diverticulosis DDx =?  Cholelithiasis  RUQ ultrasound cholelithiasis without cholecystitis Hemoglobin 12.2, WBC 6.7, platelet 209  BUN/creatinine 15/0.7-->614/0.7 Lipase 33-->54

## 2020-05-16 NOTE — Care Management Obs Status (Signed)
[place] NOTIFICATION   Patient Details  Name: Leslie Johnston MRN: [id number] Date of Birth: [birth year]   Medicare Observation Status Notification Given:  Yes Daughter at bedside signed per patient request    Candie Chroman, LCSW 05/16/2020, 12:01 PM

## 2020-05-16 NOTE — Consult Note (Signed)
Neurosurgery-New Consultation Evaluation 05/16/2020 Leslie Johnston [id number]  Identifying Statement: Leslie Johnston is a 87 y.o. female from [place] [id number] with back and abdominal pain  Physician Requesting Consultation: Dr. Verlon Au  History of Present Illness: Leslie Johnston is admitted to the hospital with ongoing pain that she describes as going from her abdomen straight through to her back.  She does feel like this started last Friday.  She does have a history of multiple compression fractures in the thoracolumbar spine in the past treated with kyphoplasty.  She had been doing well for years but recently she was experiencing some pain and she did go for a steroid injection with the physiatrist.  She does not endorse any current leg pain but she does mention some pain in the past.  She does not endorse any numbness or weakness.  She has not had any recent falls per her daughter.  She did have a CT scan of the lumbar spine which does not show any obvious new acute fractures.  Past Medical History:  Past Medical History:  Diagnosis Date  . A-fib ([place])   . Allergic rhinitis   . Allergy   . Chronic low back pain   . GERD (gastroesophageal reflux disease)   . Hypertension     Social History: Social History   Socioeconomic History  . Marital status: Widowed    Spouse name: Not on file  . Number of children: Not on file  . Years of education: Not on file  . Highest education level: Not on file  Occupational History  . Not on file  Tobacco Use  . Smoking status: Never Smoker  . Smokeless tobacco: Never Used  Vaping Use  . Vaping Use: Never used  Substance and Sexual Activity  . Alcohol use: No    Alcohol/week: 0.0 standard drinks  . Drug use: No  . Sexual activity: Not on file  Other Topics Concern  . Not on file  Social History Narrative  . Not on file   Social Determinants of Health   Financial Resource Strain: Not on file  Food Insecurity: Not on file   Transportation Needs: Not on file  Physical Activity: Not on file  Stress: Not on file  Social Connections: Not on file  Intimate Partner Violence: Not on file    Family History: Family History  Problem Relation Age of Onset  . Pancreatic cancer Sister   . Arthritis Maternal Grandmother   . Leukemia Maternal Grandfather   . Asthma Maternal Grandfather   . Breast cancer Daughter   . Bladder Cancer Daughter     Review of Systems:  Review of Systems - General ROS: Negative Psychological ROS: Negative Ophthalmic ROS: Negative ENT ROS: Negative Hematological and Lymphatic ROS: Negative  Endocrine ROS: Negative Respiratory ROS: Negative Cardiovascular ROS: Negative Gastrointestinal ROS: Positive for abdominal pain Genito-Urinary ROS: Negative Musculoskeletal ROS: Positive for back pain Neurological ROS: Negative for leg pain Dermatological ROS: Negative  Physical Exam: BP 129/79 (BP Location: Right Arm)   Pulse 83   Temp 98.5 F (36.9 C)   Resp 16   Ht 5\' 6"  (1.676 m)   Wt 56.2 kg   SpO2 91%   BMI 20.00 kg/m  Body mass index is 20 kg/m. Body surface area is 1.62 meters squared. General appearance: Alert, cooperative, in no acute distress Head: Normocephalic, atraumatic Eyes: Normal, EOM intact Oropharynx: Moist without lesions Ext: No edema in LE bilaterally  Neurologic exam:  Mental status: alertness: alert,  affect: normal Speech: fluent and clear but does perseverate Motor:strength symmetric 5/5 in bilateral hip flexion,  Dorsiflexion, plantarflexion Sensory: intact to light touch in bilateral lower extremities Gait: Not tested  Laboratory: Results for orders placed or performed during the hospital encounter of 05/15/20  SARS CORONAVIRUS 2 (TAT 6-24 HRS) Nasopharyngeal Nasopharyngeal Swab   Specimen: Nasopharyngeal Swab  Result Value Ref Range   SARS Coronavirus 2 NEGATIVE NEGATIVE  CBC  Result Value Ref Range   WBC 8.4 4.0 - 10.5 K/uL   RBC 4.56  3.87 - 5.11 MIL/uL   Hemoglobin 12.6 12.0 - 15.0 g/dL   HCT 40.2 36.0 - 46.0 %   MCV 88.2 80.0 - 100.0 fL   MCH 27.6 26.0 - 34.0 pg   MCHC 31.3 30.0 - 36.0 g/dL   RDW 13.6 11.5 - 15.5 %   Platelets 222 150 - 400 K/uL   nRBC 0.0 0.0 - 0.2 %  Urinalysis, Complete w Microscopic  Result Value Ref Range   Color, Urine STRAW (A) YELLOW   APPearance CLEAR (A) CLEAR   Specific Gravity, Urine 1.004 (L) 1.005 - 1.030   pH 8.0 5.0 - 8.0   Glucose, UA NEGATIVE NEGATIVE mg/dL   Hgb urine dipstick NEGATIVE NEGATIVE   Bilirubin Urine NEGATIVE NEGATIVE   Ketones, ur NEGATIVE NEGATIVE mg/dL   Protein, ur NEGATIVE NEGATIVE mg/dL   Nitrite NEGATIVE NEGATIVE   Leukocytes,Ua NEGATIVE NEGATIVE   RBC / HPF 0-5 0 - 5 RBC/hpf   WBC, UA 0-5 0 - 5 WBC/hpf   Bacteria, UA NONE SEEN NONE SEEN   Squamous Epithelial / LPF 0-5 0 - 5  Comprehensive metabolic panel  Result Value Ref Range   Sodium 138 135 - 145 mmol/L   Potassium 4.3 3.5 - 5.1 mmol/L   Chloride 105 98 - 111 mmol/L   CO2 26 22 - 32 mmol/L   Glucose, Bld 116 (H) 70 - 99 mg/dL   BUN 15 8 - 23 mg/dL   Creatinine, Ser 0.75 0.44 - 1.00 mg/dL   Calcium 9.3 8.9 - 10.3 mg/dL   Total Protein 6.5 6.5 - 8.1 g/dL   Albumin 3.5 3.5 - 5.0 g/dL   AST 24 15 - 41 U/L   ALT 12 0 - 44 U/L   Alkaline Phosphatase 72 38 - 126 U/L   Total Bilirubin 0.6 0.3 - 1.2 mg/dL   GFR, Estimated >60 >60 mL/min   Anion gap 7 5 - 15  Lipase, blood  Result Value Ref Range   Lipase 33 11 - 51 U/L  Magnesium  Result Value Ref Range   Magnesium 1.9 1.7 - 2.4 mg/dL  Lipase, blood  Result Value Ref Range   Lipase 54 (H) 11 - 51 U/L  Magnesium  Result Value Ref Range   Magnesium 2.3 1.7 - 2.4 mg/dL  Comprehensive metabolic panel  Result Value Ref Range   Sodium 140 135 - 145 mmol/L   Potassium 4.2 3.5 - 5.1 mmol/L   Chloride 107 98 - 111 mmol/L   CO2 28 22 - 32 mmol/L   Glucose, Bld 104 (H) 70 - 99 mg/dL   BUN 14 8 - 23 mg/dL   Creatinine, Ser 0.71 0.44 - 1.00  mg/dL   Calcium 9.3 8.9 - 10.3 mg/dL   Total Protein 6.3 (L) 6.5 - 8.1 g/dL   Albumin 3.3 (L) 3.5 - 5.0 g/dL   AST 18 15 - 41 U/L   ALT 11 0 - 44 U/L  Alkaline Phosphatase 69 38 - 126 U/L   Total Bilirubin 0.7 0.3 - 1.2 mg/dL   GFR, Estimated >60 >60 mL/min   Anion gap 5 5 - 15  CBC  Result Value Ref Range   WBC 6.7 4.0 - 10.5 K/uL   RBC 4.38 3.87 - 5.11 MIL/uL   Hemoglobin 12.2 12.0 - 15.0 g/dL   HCT 39.0 36.0 - 46.0 %   MCV 89.0 80.0 - 100.0 fL   MCH 27.9 26.0 - 34.0 pg   MCHC 31.3 30.0 - 36.0 g/dL   RDW 13.4 11.5 - 15.5 %   Platelets 209 150 - 400 K/uL   nRBC 0.0 0.0 - 0.2 %  Troponin I (High Sensitivity)  Result Value Ref Range   Troponin I (High Sensitivity) 5 <18 ng/L  Troponin I (High Sensitivity)  Result Value Ref Range   Troponin I (High Sensitivity) 6 <18 ng/L   I personally reviewed labs  Imaging: CT lumbar spine: There is a lordotic curvature.  There is diffuse degenerative disease with decreased bone density.  There is evidence of chronic compression fractures at T12 and L1.   Impression/Plan:  Ms. Wilmeth is here for evaluation of back and abdominal pain.  I am unsure of the etiology of her pain at this time but she does have a history of chronic back pain.  I do not see any acute new fracture.  Given this, I cannot recommend any surgical intervention but the patient can be treated with continued medical therapy.  I would recommend medication, possibly bracing if desired which would consist of an LSO.  She can follow-up with her physiatrist as an outpatient.  If advanced imaging is obtained, I am glad to review but given patient's normal neurologic exam and lack of radicular pain, unlikely any intervention warranted.   1.  Diagnosis: Back and abdominal pain  2.  Plan -Can follow-up on imaging if obtained -Recommend continued medical management of her pain

## 2020-05-17 DIAGNOSIS — R1013 Epigastric pain: Secondary | ICD-10-CM | POA: Diagnosis not present

## 2020-05-17 MED ORDER — OXYCODONE-ACETAMINOPHEN 5-325 MG PO TABS: 1.0000 | ORAL_TABLET | ORAL | 0 refills | Status: DC | PRN

## 2020-05-17 NOTE — Progress Notes (Signed)
We do not have lumbar corsets available her at [facility]. The daughter said that she wasn't sure if the patient would tolerate a corset because she doesn't even like wearing bras. Order received from Dr Verlon Au for approval to use an abdominal binder instead

## 2020-05-17 NOTE — Discharge Summary (Signed)
Physician Discharge Summary  Leslie Johnston JXB:[id number] DOB: [birth year] DOA: 05/15/2020  PCP: System, Provider Not In  Admit date: 05/15/2020 Discharge date: 05/17/2020  Time spent: 27 minutes  Recommendations for Outpatient Follow-up:  1. Recommend follow-up visit with physiatry in the outpatient setting regarding epidural steroid injections-May be at high risk for worsening osteoporosis secondary to the steroid shots?  Defer to them 2. Recommend abdominal binder/lumbar corset at movement times 3. Home health PT ordered for stability and management in the outpatient setting 4. Home oxygen ordered Will need incentive spirometry in addition and teaching about the same  Discharge Diagnoses:  MAIN problem for hospitalization   Lumbar fracture  Please see below for itemized issues addressed in [place]- refer to other progress notes for clarity if needed  Discharge Condition: Improved  Diet recommendation: Liberalize  Filed Weights   05/15/20 2233 05/16/20 0519 05/17/20 0434  Weight: 54.9 kg 56.2 kg 53.9 kg    History of present illness:  43 white female community dwelling P A. fib CHADS2 score >5/Eliquis (diagnosed 02/10/2020) Kyphoplasty 11/2017 T12--L1-follows with physiatry Dr. Palma Holter to have [facility] 05/16/2020 Prior pathological fracture humerus Also follows with [facility] in the home for symptom management Possible dementia  Came to ED with severe abdominal/back pain-labs essentially normal WBC 8400 EKG benign CT abdomen pelvis = hiatal hernia diverticulosis DDx =?  Cholelithiasis  RUQ ultrasound cholelithiasis without cholecystitis Hemoglobin 12.2, WBC 6.7, platelet 209  BUN/creatinine 15/0.7-->614/0.7  lipase was elevated but it was not thought to be secondary to any abdominal procedures or findings  Hospital Course:  Back and abdominal pain Patient has point tenderness to lumbar spine She can straight leg raise however Imaging of her  lower back showed acute fracture T7-T8 and T10 with 40% and 20% height loss at T8 and T10 respectively without retropulsion Pain control with oxycodone first choice every 4 as needed and  Dr. Lacinda Axon neurosurgery reviewed the patient in consult and did not feel that patient was a candidate for any type of interventional procedure and recommended follow-up with physiatry for consideration versus not of epidural steroid injections Abdominal pain Likely secondary to #1 problem Lipase is coincidental She was tolerating a diet  Paroxysmal A. fib CHADS2 score >4 diagnosed 2022 Eliquis resumed on discharge Cardizem resumed on discharge HTN Continue lisinopril Adult failure to thrive in the setting of mild dementia "eldest -old" Followed by hospice in the outpatient setting presumably for symptom management We will asked them to comment    Consultations:  Neurosurgery  Discharge Exam: Vitals:   05/17/20 0950 05/17/20 1206  BP:  123/65  Pulse:  89  Resp:  16  Temp:  97.9 F (36.6 C)  SpO2: 92% 94%    Subj on day of d/c   Awake pleasant coherent working with therapy using an abdominal binder has some pain on initiation of movement from supine to prone however when she gets up she is moving around and ambulatory I had a brief discussion with her daughter at the bedside and they state that someone will be at home with her all the time She is eating and drinking but does require oxygen she does not have fever chills not leading me to think of any superimposed process although she may be splinting  General Exam on discharge  EOMI NCAT no icterus no pallor CTA B no rales rhonchi Power lower extremity straight leg raise 5/5 knee extension 5/5 power in addition  Discharge Instructions   Discharge Instructions  Diet - low sodium heart healthy   Complete by: As directed    Discharge instructions   Complete by: As directed    Take pain meds to help as directed Follow with physiatry    Increase activity slowly   Complete by: As directed      Allergies as of 05/17/2020      Reactions   Amlodipine Swelling   Losartan Itching   Penicillins Hives, Other (See Comments)   Has patient had a PCN reaction causing immediate rash, facial/tongue/throat swelling, SOB or lightheadedness with hypotension: No Has patient had a PCN reaction causing severe rash involving mucus membranes or skin necrosis: No Has patient had a PCN reaction that required hospitalization: No Has patient had a PCN reaction occurring within the last 10 years: No If all of the above answers are "NO", then may proceed with Cephalosporin use.   Tramadol Other (See Comments)   Confusion      Medication List    TAKE these medications   acetaminophen 650 MG CR tablet Commonly known as: TYLENOL Take 650 mg by mouth every 8 (eight) hours as needed for pain.   alum & mag hydroxide-simeth 200-200-20 MG/5ML suspension Commonly known as: MAALOX/MYLANTA Take by mouth every 6 (six) hours as needed for indigestion or heartburn.   apixaban 2.5 MG Tabs tablet Commonly known as: ELIQUIS Take 1 tablet (2.5 mg total) by mouth 2 (two) times daily.   diltiazem 120 MG 24 hr capsule Commonly known as: CARDIZEM CD Take 1 capsule (120 mg total) by mouth daily.   gabapentin 100 MG capsule Commonly known as: NEURONTIN Start 1 capsule daily, increase by 1 cap every 2-3 days as tolerated up to 3 times a day, or may take 3 at once in evening.   lisinopril 20 MG tablet Commonly known as: ZESTRIL TAKE 1 TABLET(20 MG) BY MOUTH DAILY   mirtazapine 7.5 MG tablet Commonly known as: REMERON TAKE 1 TABLET(7.5 MG) BY MOUTH AT BEDTIME   multivitamin with minerals Tabs tablet Take 1 tablet by mouth daily with lunch.   oxyCODONE-acetaminophen 5-325 MG tablet Commonly known as: PERCOCET/ROXICET Take 1 tablet by mouth every 4 (four) hours as needed for moderate pain.   pantoprazole 40 MG tablet Commonly known as:  PROTONIX TAKE 1 TABLET(40 MG) BY MOUTH DAILY BEFORE BREAKFAST   PRESERVISION AREDS 2+MULTI VIT PO Take by mouth.            Durable Medical Equipment  (From admission, onward)         Start     Ordered   05/17/20 1448  For home use only DME oxygen  Once       Question Answer Comment  Length of Need 12 Months   Mode or (Route) Nasal cannula   Liters per Minute 2   Frequency Continuous (stationary and portable oxygen unit needed)   Oxygen delivery system Gas      05/17/20 1449         Allergies  Allergen Reactions  . Amlodipine Swelling  . Losartan Itching  . Penicillins Hives and Other (See Comments)    Has patient had a PCN reaction causing immediate rash, facial/tongue/throat swelling, SOB or lightheadedness with hypotension: No Has patient had a PCN reaction causing severe rash involving mucus membranes or skin necrosis: No Has patient had a PCN reaction that required hospitalization: No Has patient had a PCN reaction occurring within the last 10 years: No If all of the above answers are "NO", then  may proceed with Cephalosporin use.   . Tramadol Other (See Comments)    Confusion      The results of significant diagnostics from this hospitalization (including imaging, microbiology, ancillary and laboratory) are listed below for reference.    Significant Diagnostic Studies: CT ABDOMEN PELVIS WO CONTRAST  Result Date: 05/15/2020 CLINICAL DATA:  Upper abdominal pain that radiates to the back since Friday EXAM: CT ABDOMEN AND PELVIS WITHOUT CONTRAST TECHNIQUE: Multidetector CT imaging of the abdomen and pelvis was performed following the standard protocol without IV contrast. COMPARISON:  February 28, 2020 FINDINGS: Lower chest: Scarring versus atelectasis in the lung bases. Moderate hiatal hernia. Hepatobiliary: Unremarkable noncontrast appearance of the hepatic parenchyma. Calcified gallstones without findings of acute cholecystitis. No biliary ductal dilation.  Pancreas: Within normal limits. Spleen: Peripheral calcifications within a 1.2 cm hypodense splenic lesion, likely sequela prior infection or trauma. Adrenals/Urinary Tract: Bilateral adrenal glands are unremarkable. Bilateral hypodense renal lesions measuring up to 3.1 cm on the right and 2.3 cm on the left the incompletely evaluated on this single-phase study but favored represent cysts. No hydronephrosis. No nephrolithiasis. Urinary bladder is grossly unremarkable for degree of distension. Stomach/Bowel: Moderate hiatal hernia otherwise the stomach is grossly unremarkable for degree of distension. Normal positioning of the duodenum/ligament of [place]. Duodenal diverticulum. No pathologic dilation of small bowel. The appendix is not definitely visualized however there is no pericecal inflammation. Colonic diverticulosis without findings of acute diverticulitis. Vascular/Lymphatic: Aortic and branch vessel atherosclerosis. No abdominal aortic aneurysm. No pathologically enlarged abdominal or pelvic lymph nodes. Reproductive: Uterine leiomyoma.  No suspicious adnexal lesions. Other: No abdominopelvic ascites.  No pneumoperitoneum. Musculoskeletal: Diffuse demineralization of bone. No rest of lytic or blastic lesions of bone. Multilevel degenerative changes spine. T12 and L1 kyphoplasty. No acute osseous abnormality. IMPRESSION: 1. No acute findings in the abdomen or pelvis. 2. Cholelithiasis without findings of acute cholecystitis. 3. Moderate hiatal hernia. 4. Colonic diverticulosis without findings of acute diverticulitis. 5. Uterine leiomyoma. 6. Chronic appearing multilevel vertebral body height loss of the visualized lower thoracic and lumbar spine with prior T12 and L1 kyphoplasties. Aortic Atherosclerosis (ICD10-I70.0). Electronically Signed   By: Dahlia Bailiff MD   On: 05/15/2020 19:09   MR THORACIC SPINE W WO CONTRAST  Result Date: 05/16/2020 CLINICAL DATA:  Intervertebral disc degeneration, T-spine  fracture. Compression fracture, L-spine. EXAM: MRI THORACIC AND LUMBAR SPINE WITHOUT AND WITH CONTRAST TECHNIQUE: Multiplanar and multiecho pulse sequences of the thoracic and lumbar spine were obtained without and with intravenous contrast. CONTRAST:  73mL GADAVIST GADOBUTROL 1 MMOL/ML IV SOLN COMPARISON:  MRI of the lumbar spine March 31, 2020. FINDINGS: MRI THORACIC SPINE FINDINGS Alignment: Mild kyphosis in the lower thoracic spine centered at T11. Vertebrae: Compression fractures of T7, T8 and T10 with associated marrow edema and contrast in enhancement, consistent with acute/subacute processes. There is 40% and 20% height loss at T8 and T10 without significant height loss at T7. No retropulsion. Chronic compression fractures at T11 and T12 status post cement augmentation at T12. Cord: Evaluation of the cord is limited by motion artifact. No gross cord signal abnormality. Paraspinal and other soft tissues: Moderate hiatal hernia. Disc levels: Small retropulsion of the superior aspect of the T12 posterior wall into the spinal canal causing indentation of the thecal sac without significant spinal canal stenosis. Otherwise, no significant disc herniation or high-grade spinal canal or neural foraminal stenosis. MRI LUMBAR SPINE FINDINGS Segmentation:  Standard. Alignment:  Physiologic. Vertebrae: No acute fracture, evidence of discitis, or  bone lesion. Chronic compression fracture of the L1 with loss of approximately 60% of vertebral body height anteriorly, status post vertebral augmentation. Chronic compression fracture of L2 with loss of approximately 50% of vertebral body height. Conus medullaris: Extends to the L1 level and appears normal. Paraspinal and other soft tissues: Bilateral renal cysts. Disc levels: T12-L1: Disc bulge, facet degenerative changes and retropulsion of the superior aspect of the L1 posterior wall causing indentation of the thecal sac and resulting in mild spinal canal stenosis. Mild right  and moderate left neural foraminal narrowing. L1-2: Disc bulge, facet degenerative change ligamentum flavum redundancy resulting in mild spinal canal stenosis and mild bilateral neural foraminal narrowing. L2-3: Disc bulge, facet degenerative change ligamentum flavum redundancy resulting in narrowing of the bilateral subarticular zones and mild bilateral neural foraminal narrowing. L3-4: Disc bulge, facet degenerative changes and ligamentum flavum redundancy resulting in narrowing of the bilateral subarticular zones and mild bilateral neural foraminal narrowing. L4-5: Disc bulge, facet degenerative changes and ligamentum flavum redundancy resulting in narrowing of the bilateral subarticular zones and mild bilateral neural foraminal narrowing. L5-S1: Shallow disc bulge and mild facet degenerative changes. No spinal canal or neural foraminal stenosis. IMPRESSION: 1. Acute/subacute compression fractures at T7, T8 and T10 with approximately 40% and 20% height loss at T8 and T10, respectively. No retropulsion. 2. Chronic compression fractures status post vertebral augmentation at T12 and L1, unchanged. 3. Degenerative changes of the lumbar spine, stable from prior MRI. Electronically Signed   By: Pedro Earls M.D.   On: 05/16/2020 17:08   MR Lumbar Spine W Wo Contrast  Result Date: 05/16/2020 CLINICAL DATA:  Intervertebral disc degeneration, T-spine fracture. Compression fracture, L-spine. EXAM: MRI THORACIC AND LUMBAR SPINE WITHOUT AND WITH CONTRAST TECHNIQUE: Multiplanar and multiecho pulse sequences of the thoracic and lumbar spine were obtained without and with intravenous contrast. CONTRAST:  42mL GADAVIST GADOBUTROL 1 MMOL/ML IV SOLN COMPARISON:  MRI of the lumbar spine March 31, 2020. FINDINGS: MRI THORACIC SPINE FINDINGS Alignment: Mild kyphosis in the lower thoracic spine centered at T11. Vertebrae: Compression fractures of T7, T8 and T10 with associated marrow edema and contrast in  enhancement, consistent with acute/subacute processes. There is 40% and 20% height loss at T8 and T10 without significant height loss at T7. No retropulsion. Chronic compression fractures at T11 and T12 status post cement augmentation at T12. Cord: Evaluation of the cord is limited by motion artifact. No gross cord signal abnormality. Paraspinal and other soft tissues: Moderate hiatal hernia. Disc levels: Small retropulsion of the superior aspect of the T12 posterior wall into the spinal canal causing indentation of the thecal sac without significant spinal canal stenosis. Otherwise, no significant disc herniation or high-grade spinal canal or neural foraminal stenosis. MRI LUMBAR SPINE FINDINGS Segmentation:  Standard. Alignment:  Physiologic. Vertebrae: No acute fracture, evidence of discitis, or bone lesion. Chronic compression fracture of the L1 with loss of approximately 60% of vertebral body height anteriorly, status post vertebral augmentation. Chronic compression fracture of L2 with loss of approximately 50% of vertebral body height. Conus medullaris: Extends to the L1 level and appears normal. Paraspinal and other soft tissues: Bilateral renal cysts. Disc levels: T12-L1: Disc bulge, facet degenerative changes and retropulsion of the superior aspect of the L1 posterior wall causing indentation of the thecal sac and resulting in mild spinal canal stenosis. Mild right and moderate left neural foraminal narrowing. L1-2: Disc bulge, facet degenerative change ligamentum flavum redundancy resulting in mild spinal canal stenosis and mild  bilateral neural foraminal narrowing. L2-3: Disc bulge, facet degenerative change ligamentum flavum redundancy resulting in narrowing of the bilateral subarticular zones and mild bilateral neural foraminal narrowing. L3-4: Disc bulge, facet degenerative changes and ligamentum flavum redundancy resulting in narrowing of the bilateral subarticular zones and mild bilateral neural  foraminal narrowing. L4-5: Disc bulge, facet degenerative changes and ligamentum flavum redundancy resulting in narrowing of the bilateral subarticular zones and mild bilateral neural foraminal narrowing. L5-S1: Shallow disc bulge and mild facet degenerative changes. No spinal canal or neural foraminal stenosis. IMPRESSION: 1. Acute/subacute compression fractures at T7, T8 and T10 with approximately 40% and 20% height loss at T8 and T10, respectively. No retropulsion. 2. Chronic compression fractures status post vertebral augmentation at T12 and L1, unchanged. 3. Degenerative changes of the lumbar spine, stable from prior MRI. Electronically Signed   By: Pedro Earls M.D.   On: 05/16/2020 17:08   US ABDOMEN LIMITED RUQ (LIVER/GB)  Result Date: 05/15/2020 CLINICAL DATA:  Acute epigastric pain EXAM: ULTRASOUND ABDOMEN LIMITED RIGHT UPPER QUADRANT COMPARISON:  Same day CT abdomen pelvis FINDINGS: Gallbladder: Cholelithiasis in the gallbladder neck measuring up to 7 mm. No pericholecystic fluid or wall thickening visualized. No sonographic Murphy sign noted by sonographer. Common bile duct: Diameter: 8 mm, within normal limits for patient's age. Liver: No focal lesion identified. Within normal limits in parenchymal echogenicity. Portal vein is patent on color Doppler imaging with normal direction of blood flow towards the liver. Other: None. IMPRESSION: Cholelithiasis without other sonographic evidence of acute cholecystitis. Electronically Signed   By: Dahlia Bailiff MD   On: 05/15/2020 23:10    Microbiology: Recent Results (from the past 240 hour(s))  SARS CORONAVIRUS 2 (TAT 6-24 HRS) Nasopharyngeal Nasopharyngeal Swab     Status: None   Collection Time: 05/15/20  7:50 PM   Specimen: Nasopharyngeal Swab  Result Value Ref Range Status   SARS Coronavirus 2 NEGATIVE NEGATIVE Final    Comment: (NOTE) SARS-CoV-2 target nucleic acids are NOT DETECTED.  The SARS-CoV-2 RNA is generally  detectable in upper and lower respiratory specimens during the acute phase of infection. Negative results do not preclude SARS-CoV-2 infection, do not rule out co-infections with other pathogens, and should not be used as the sole basis for treatment or other patient management decisions. Negative results must be combined with clinical observations, patient history, and epidemiological information. The expected result is Negative.  Fact Sheet for Patients: SugarRoll.be  Fact Sheet for Healthcare Providers: https://www.woods-mathews.com/  This test is not yet approved or cleared by the [place] FDA and  has been authorized for detection and/or diagnosis of SARS-CoV-2 by FDA under an Emergency Use Authorization (EUA). This EUA will remain  in effect (meaning this test can be used) for the duration of the COVID-19 declaration under Se ction 564(b)(1) of the Act, 21 U.S.C. section 360bbb-3(b)(1), unless the authorization is terminated or revoked sooner.  Performed at [facility], [place] [address], [place], [place] [address]      Labs: Basic Metabolic Panel: Recent Labs  Lab 05/15/20 1811 05/15/20 2210 05/16/20 0400  NA 138  --  140  K 4.3  --  4.2  CL 105  --  107  CO2 26  --  28  GLUCOSE 116*  --  104*  BUN 15  --  14  CREATININE 0.75  --  0.71  CALCIUM 9.3  --  9.3  MG  --  1.9 2.3   Liver Function Tests: Recent  Labs  Lab 05/15/20 1811 05/16/20 0400  AST 24 18  ALT 12 11  ALKPHOS 72 69  BILITOT 0.6 0.7  PROT 6.5 6.3*  ALBUMIN 3.5 3.3*   Recent Labs  Lab 05/15/20 1811 05/16/20 0400  LIPASE 33 54*   No results for input(s): AMMONIA in the last 168 hours. CBC: Recent Labs  Lab 05/15/20 1659 05/16/20 0400  WBC 8.4 6.7  HGB 12.6 12.2  HCT 40.2 39.0  MCV 88.2 89.0  PLT 222 209   Cardiac Enzymes: No results for input(s): CKTOTAL, CKMB, CKMBINDEX, TROPONINI in the last 168 hours. BNP: BNP (last 3  results) No results for input(s): BNP in the last 8760 hours.  ProBNP (last 3 results) No results for input(s): PROBNP in the last 8760 hours.  CBG: No results for input(s): GLUCAP in the last 168 hours.     Signed:  Nita Sells MD   [facility] 05/17/2020, 2:51 PM

## 2020-05-17 NOTE — TOC Transition Note (Signed)
Transition of Care [facility]) - CM/SW Discharge Note   Patient Details  Name: CATRENA VARI MRN: [id number] Date of Birth: [birth year]  [facility]) CM/SW Contact:  Candie Chroman, LCSW Phone Number: 05/17/2020, 3:15 PM   Clinical Narrative:  Patient will discharge home today. [facility] representative is aware. They do not have aides available at this time but will use OT assistants in their place. Asked MD to take aide out of [facility] order. Ordered oxygen through [facility]. Daughter has been updated. No further concerns. CSW signing off.  Final next level of care: [place] Barriers to Discharge: Barriers Resolved   Patient Goals and CMS Choice     Choice offered to / list presented to : Adult Children  Discharge Placement                Patient to be transferred to facility by: Daughters will take her home Name of family member notified: [name] Patient and family notified of of transfer: 05/17/20  Discharge Plan and Services                DME Arranged: Oxygen DME Agency: [employer] Date DME Agency Contacted: 05/17/20   Representative spoke with at DME Agency: [place]: PT,OT [place] Agency: [place] Date [place]: 05/17/20   Representative spoke with at [place]: [place] ([place]) Interventions     Readmission Risk Interventions No flowsheet data found.

## 2020-05-17 NOTE — Evaluation (Signed)
Physical Therapy Evaluation Patient Details Name: Leslie Johnston MRN: [id number] DOB: 1925-05-25 Today's Date: 05/17/2020   History of Present Illness  Pt is a 87 yo female that presented to ED for ongoing pain involving her abdomen  straight through her back per pt. CT scan did not show any acute changes for previous mulitple compression fxs. PMH of kyphoplasty (status post T12-L1 kyphoplasty in November 2019), afib, GERD, HTN.    Clinical Impression  Pt alert, in bed, family at bedside. Oriented to self, month/year. Exhibited 10/10 pain on the FACES scale with bed mobility and transfers, endorsed 6/10 pain in low back after ambulating/using commode. Per pt and family, at baseline pt ambulates with [facility], has near 24/7 assist as needed, no falls in the last year.   Patient instructed in log roll technique with verbal/visual cues, able to perform with minA for complete trunk elevation. Sit <> stand from EOB and from [facility] over standard commode, CGA with proper UE support and cueing. The patient ambulated ~11ft total, trialed RW and SPC. Pt needed cueing  For RW placement/hand placement, and had some mild unsteadiness with SPC (reached for additional external support) family educated on AD options. Abdominal binder also donned at EOB, family verbalized understanding.  Overall the patient demonstrated deficits (see "PT Problem List") that impede the patient's functional abilities, safety, and mobility and would benefit from skilled PT intervention. Recommendation at this time is HHPT with supervision/assist 24/7, especially for OOB/mobility.      Follow Up Recommendations Home health PT;Supervision/Assistance - 24 hour;Supervision for mobility/OOB    Equipment Recommendations  None recommended by PT    Recommendations for Other Services OT consult     Precautions / Restrictions Precautions Precautions: Fall Required Braces or Orthoses: Other Brace Other Brace: corset due to acute/subacute  compression fx Restrictions Weight Bearing Restrictions: No      Mobility  Bed Mobility Overal bed mobility: Needs Assistance Bed Mobility: Rolling;Sidelying to Sit;Sit to Sidelying Rolling: Min guard Sidelying to sit: Min assist;HOB elevated     Sit to sidelying: Min guard;HOB elevated General bed mobility comments: instructed in log roll technique; pt able to perform when returning to supine with prompting    Transfers Overall transfer level: Needs assistance Equipment used: Rolling walker (2 wheeled);Straight cane Transfers: Sit to/from Stand Sit to Stand: Min guard         General transfer comment: with proper UE support pt able to perform with CGA, cued for hand placement with RW, use of grab bar in bathroom  Ambulation/Gait Ambulation/Gait assistance: Min guard Gait Distance (Feet): 25 Feet Assistive device: Rolling walker (2 wheeled);Straight cane       General Gait Details: Pt able to use RW or SPC; pt slightly more unsteady with SPC and did reach for additional external support  Stairs            Wheelchair Mobility    Modified Rankin (Stroke Patients Only)       Balance Overall balance assessment: Needs assistance Sitting-balance support: Feet supported Sitting balance-Leahy Scale: Good     Standing balance support: Single extremity supported Standing balance-Leahy Scale: Good                               Pertinent Vitals/Pain Pain Assessment: 0-10 Pain Score: 6  Pain Location: after ambulatingto commode; indicated 10/10 pain with bed mobility Pain Descriptors / Indicators: Aching;Grimacing;Moaning Pain Intervention(s): Limited activity within patient's tolerance;Monitored during  session;Repositioned;Heat applied    Home Living Family/patient expects to be discharged to:: Private residence Living Arrangements: Alone;Children Available Help at Discharge: Family;Available 24 hours/day (4 children rotate) Type of Home:  House Home Access: Stairs to enter Entrance Stairs-Rails: Left Entrance Stairs-Number of Steps: 3 Home Layout: One level Home Equipment: Walker - 2 wheels;Cane - single point;Other (comment) (bed rail)      Prior Function Level of Independence: Needs assistance   Gait / Transfers Assistance Needed: modI with SPC, but tends to have family supervision  ADL's / Homemaking Assistance Needed: pt independent as possible; set up as needed for dressing, assist with bathing  Comments: no falls in the last year     Hand Dominance        Extremity/Trunk Assessment   Upper Extremity Assessment Upper Extremity Assessment: Overall WFL for tasks assessed    Lower Extremity Assessment Lower Extremity Assessment: Generalized weakness    Cervical / Trunk Assessment Cervical / Trunk Assessment: Kyphotic  Communication   Communication: HOH (hearing aides)  Cognition Arousal/Alertness: Awake/alert Behavior During Therapy: WFL for tasks assessed/performed Overall Cognitive Status: Difficult to assess                                 General Comments: oriented to self, oriented to month, year      General Comments      Exercises Other Exercises Other Exercises: Pt and family educated on log roll technique, donning lumbar corset use of RW at home for pain, as well as sleeping in a recliner at home to assist with pain as needed   Assessment/Plan    PT Assessment Patient needs continued PT services  PT Problem List Decreased strength;Decreased mobility;Decreased range of motion;Decreased activity tolerance;Pain;Decreased balance;Decreased knowledge of use of DME       PT Treatment Interventions DME instruction;Therapeutic exercise;Gait training;Balance training;Stair training;Neuromuscular re-education;Functional mobility training;Therapeutic activities;Patient/family education    PT Goals (Current goals can be found in the Care Plan section)  Acute Rehab PT  Goals Patient Stated Goal: to decrease pain PT Goal Formulation: With patient Time For Goal Achievement: 05/31/20 Potential to Achieve Goals: Good    Frequency Min 2X/week   Barriers to discharge        Co-evaluation               AM-PAC PT "6 Clicks" Mobility  Outcome Measure Help needed turning from your back to your side while in a flat bed without using bedrails?: A Little Help needed moving from lying on your back to sitting on the side of a flat bed without using bedrails?: A Little Help needed moving to and from a bed to a chair (including a wheelchair)?: A Little Help needed standing up from a chair using your arms (e.g., wheelchair or bedside chair)?: A Little Help needed to walk in hospital room?: A Little Help needed climbing 3-5 steps with a railing? : A Lot 6 Click Score: 17    End of Session Equipment Utilized During Treatment: Gait belt;Oxygen (2L) Activity Tolerance: Patient tolerated treatment well Patient left: in bed;with call bell/phone within reach;with bed alarm set;with family/visitor present Nurse Communication: Mobility status PT Visit Diagnosis: Other abnormalities of gait and mobility (R26.89);Pain;Difficulty in walking, not elsewhere classified (R26.2);Muscle weakness (generalized) (M62.81) Pain - Right/Left:  (midline) Pain - part of body:  (low back pain)    Time: 1340-1415 PT Time Calculation (min) (ACUTE ONLY): 35 min  Charges:   PT Evaluation $PT Eval Low Complexity: 1 Low PT Treatments $Therapeutic Activity: 23-37 mins       Lieutenant Diego PT, DPT 2:47 PM,05/17/20

## 2020-05-17 NOTE — TOC Initial Note (Signed)
Transition of Care [facility]) - Initial/Assessment Note    Patient Details  Name: Leslie Johnston MRN: [id number] Date of Birth: November 10, 1925  Transition of [facility]) CM/SW Contact:    Candie Chroman, LCSW Phone Number: 05/17/2020, 9:43 AM  Clinical Narrative: CSW spoke with patient and daughter Leslie Johnston yesterday. Mrs. Leslie Johnston stated that [facility] had made a home health PT referral prior to admission but she could not remember the name of the agency. CSW left a voicemail at [facility] yesterday so see which agency it was. They later left a voicemail stating it was [facility]. CSW called [facility] representative and she said they can provide whatever services patient needs. No further concerns. CSW encouraged patient and her daughter to contact CSW as needed. CSW will continue to follow patient and her daughter for support and facilitate return home when stable.                Expected Discharge Plan: [place] Barriers to Discharge: Continued Medical Work up   Patient Goals and CMS Choice        Expected Discharge Plan and Services Expected Discharge Plan: [place]       Living arrangements for the past 2 months: Single Family Home                           HH Arranged: PT HH Agency: [place] Date [facility] Agency Contacted: 05/17/20   Representative spoke with at [place]: Sharmon Revere  Prior Living Arrangements/Services Living arrangements for the past 2 months: [place]   Patient language and need for interpreter reviewed:: Yes Do you feel safe going back to the place where you live?: Yes      Need for Family Participation in Patient Care: Yes (Comment) Care giver support system in place?: Yes (comment)   Criminal Activity/Legal Involvement Pertinent to Current Situation/Hospitalization: No - Comment as needed  Activities of Daily Living Home Assistive Devices/Equipment: Cane (specify  quad or straight),Hearing aid,Shower chair without back ADL Screening (condition at time of admission) Patient's cognitive ability adequate to safely complete daily activities?: Yes Is the patient deaf or have difficulty hearing?: Yes Does the patient have difficulty seeing, even when wearing glasses/contacts?: No Does the patient have difficulty concentrating, remembering, or making decisions?: Yes Patient able to express need for assistance with ADLs?: Yes Does the patient have difficulty dressing or bathing?: Yes Independently performs ADLs?: Yes (appropriate for developmental age) Does the patient have difficulty walking or climbing stairs?: No Weakness of Legs: None Weakness of Arms/Hands: None  Permission Sought/Granted Permission sought to share information with : Facility Art therapist granted to share information with : Yes, Verbal Permission Granted  Share Information with NAME: Leslie Johnston     Permission granted to share info w Relationship: Daughter  Permission granted to share info w Contact Information: [phone]  Emotional Assessment Appearance:: Appears stated age Attitude/Demeanor/Rapport: Engaged,Gracious Affect (typically observed): Accepting,Appropriate,Calm,Pleasant Orientation: : Oriented to Self,Oriented to Place,Oriented to  Time,Oriented to Situation Alcohol / Substance Use: Not Applicable Psych Involvement: No (comment)  Admission diagnosis:  Epigastric pain [R10.13] Acute epigastric pain [R10.13] Patient Active Problem List   Diagnosis Date Noted  . Acute epigastric pain 05/15/2020  . A-fib ([place])   . GERD (gastroesophageal reflux disease)   . Hypertension   . Chronic bilateral low back pain without sciatica 03/08/2020  . Atrial fibrillation  with RVR ([place]) 02/10/2020  . OAB (overactive bladder) 04/15/2019  . Dysthymia 04/15/2019  . Postmenopausal osteoporosis with pathological fracture of humerus 09/13/2016  . Humerus fracture  08/29/2016  . Chronic seasonal allergic rhinitis 03/20/2016  . Abnormal chest x-ray 09/19/2015  . Benign hypertension with CKD (chronic kidney disease) stage III 07/27/2014   PCP:  System, Provider Not In Pharmacy:   [facility] [place], [place] [address] [place] [address] Phone: [phone] Fax: [phone]     Social Determinants of Health (SDOH) Interventions    Readmission Risk Interventions No flowsheet data found.

## 2020-05-17 NOTE — Progress Notes (Signed)
SATURATION QUALIFICATIONS:  Patient Saturations on Room Air at Rest = 88%    Patient Saturations on 2 Liters of oxygen while Ambulating = 92%  Please briefly explain why patient needs home oxygen: patient desats on room air at rest without oxygen

## 2020-05-19 ENCOUNTER — Other Ambulatory Visit: Payer: Medicare Other | Admitting: Adult Health Nurse Practitioner

## 2020-05-19 ENCOUNTER — Encounter: Payer: Self-pay | Admitting: Adult Health Nurse Practitioner

## 2020-05-19 ENCOUNTER — Other Ambulatory Visit: Payer: Self-pay

## 2020-05-19 VITALS — BP 136/68 | HR 81

## 2020-05-19 DIAGNOSIS — G8929 Other chronic pain: Secondary | ICD-10-CM | POA: Diagnosis not present

## 2020-05-19 DIAGNOSIS — I480 Paroxysmal atrial fibrillation: Secondary | ICD-10-CM | POA: Diagnosis not present

## 2020-05-19 DIAGNOSIS — Z79891 Long term (current) use of opiate analgesic: Secondary | ICD-10-CM | POA: Diagnosis not present

## 2020-05-19 DIAGNOSIS — Z9981 Dependence on supplemental oxygen: Secondary | ICD-10-CM | POA: Diagnosis not present

## 2020-05-19 DIAGNOSIS — Z515 Encounter for palliative care: Secondary | ICD-10-CM

## 2020-05-19 DIAGNOSIS — Z7901 Long term (current) use of anticoagulants: Secondary | ICD-10-CM | POA: Diagnosis not present

## 2020-05-19 DIAGNOSIS — I129 Hypertensive chronic kidney disease with stage 1 through stage 4 chronic kidney disease, or unspecified chronic kidney disease: Secondary | ICD-10-CM | POA: Diagnosis not present

## 2020-05-19 DIAGNOSIS — M5416 Radiculopathy, lumbar region: Secondary | ICD-10-CM | POA: Insufficient documentation

## 2020-05-19 DIAGNOSIS — F32A Depression, unspecified: Secondary | ICD-10-CM | POA: Diagnosis not present

## 2020-05-19 DIAGNOSIS — S22069D Unspecified fracture of T7-T8 vertebra, subsequent encounter for fracture with routine healing: Secondary | ICD-10-CM | POA: Diagnosis not present

## 2020-05-19 DIAGNOSIS — M545 Low back pain, unspecified: Secondary | ICD-10-CM

## 2020-05-19 DIAGNOSIS — M8448XA Pathological fracture, other site, initial encounter for fracture: Secondary | ICD-10-CM | POA: Diagnosis not present

## 2020-05-19 DIAGNOSIS — F0390 Unspecified dementia without behavioral disturbance: Secondary | ICD-10-CM | POA: Diagnosis not present

## 2020-05-19 DIAGNOSIS — N183 Chronic kidney disease, stage 3 unspecified: Secondary | ICD-10-CM | POA: Diagnosis not present

## 2020-05-19 DIAGNOSIS — M48061 Spinal stenosis, lumbar region without neurogenic claudication: Secondary | ICD-10-CM | POA: Insufficient documentation

## 2020-05-19 DIAGNOSIS — R32 Unspecified urinary incontinence: Secondary | ICD-10-CM | POA: Diagnosis not present

## 2020-05-19 LAB — LACTATE DEHYDROGENASE, ISOENZYMES
LDH 1: 26 % (ref 17–32)
LDH 2: 35 % (ref 25–40)
LDH 3: 20 % (ref 17–27)
LDH 4: 11 % (ref 5–13)
LDH 5: 8 % (ref 4–20)
LDH Isoenzymes, Total: 171 IU/L (ref 119–226)

## 2020-05-19 NOTE — Progress Notes (Signed)
Therapist, nutritional Palliative Care Consult Note Telephone: [phone]  Fax: [phone]    Date of encounter: 05/19/20 PATIENT NAME: Leslie Johnston [address] [place] [address]   [phone] (home)  DOB: [birth year] MRN: [id number] PRIMARY CARE PROVIDER:    System, Provider Not In,  No address on file None  REFERRING PROVIDER:   Tarri Fuller, FNP  RESPONSIBLE PARTY:    Contact Information    Name Relation Home Work Mobile   [place] Daughter   [phone]   Darreld Mclean Daughter   [phone]       I met face to face with patient and family in home. Palliative Care was asked to follow this patient by consultation request of  Malfi, Jodelle Gross, FNP to address advance care planning and complex medical decision making. This is a follow up visit.  Daughters present during visit today                                   ASSESSMENT AND PLAN / RECOMMENDATIONS:   Advance Care Planning/Goals of Care: Goals include to maximize quality of life and symptom management.   CODE STATUS: DNR  Symptom Management/Plan:  Chronic low back pain/Vertebral fractures:  Continue working with PT and wearing abdominal binder with movement.  Encouraged to use only Tylenol for the pain as it appears the Percocet is depressing her respiratory response. Have advised that is she needs the percocet for severe pain that it best she lay down and have the oxygen on.   Dementia:  She appears at baseline and discussed with daughters that they can continue care as they were prior to the hospitalization.  They do have someone in the home almost 24/7.  Will continue to assess for any safety concerns.   Follow up Palliative Care Visit: Palliative care will continue to follow for complex medical decision making, advance care planning, and clarification of goals. Return 4 weeks or prn. Encouraged to call with any questions or concerns  I spent 60 minutes providing this  consultation. More than 50% of the time in this consultation was spent in counseling and care coordination.   PPS: 50%  HOSPICE ELIGIBILITY/DIAGNOSIS: TBD  Chief Complaint: follow up palliative visit  HISTORY OF PRESENT ILLNESS:  Leslie Johnston is a 87 y.o. year old female  with A. fib, HTN, dementia, CKD stage III, overactive bladder, osteoporosis with past pathological fracture of humerus. Patient had hospitalization 5/15-5/17/22 for abdominal pain and MRI showed vertebral fractures of T7,T8, and T10.  She is to wear abdominal binder with movement and does have Percocet 5/325 Q 4hr PRN for pain.  She was started on supplemental oxygen in the hospital and now has it in the home.  No clear etiology as to why her O2 sats dropped requiring supplemental O2.  Believe it may be due to the opioids.  Today her O2 sats are 95-100% on RA at rest and with activity.  She is not having SOB and lungs are clear.  Her appetite come and goes but she will eat what is put in front of her.  Denies falls, though daughters have concerns that she may trip over the O2 tubing.  Given her O2 sats did not drop with activity have discussed that she would probably be fine to take off the O2 to go to bathroom or kitchen.  Advised to continue to monitor her O2 levels  and will assess further to if needed for continued use. Rest of 10 point ROS asked and negative except what is stated in HPI  History obtained from review of EMR and interview with family and Leslie Johnston.  I reviewed available labs, medications, imaging, studies and related documents from the EMR.  Records reviewed and summarized above.    PHYSICAL EXAM:  General: NAD, frail appearing Eyes: Sclera anicteric and noninjected with no discharge noted ENMT: Moist mucous membranes Cardiovascular: regular rate and rhythm Pulmonary:Lung sounds clear; normal respiratory effort Abdomen: soft, nontender, + bowel sounds Extremities: no edema, no joint  deformities Skin: no rasheson exposed skin Neurological:  alert and oriented to person and place  Thank you for the opportunity to participate in the care of Leslie Johnston.  The palliative care team will continue to follow. Please call our office at [phone] if we can be of additional assistance.   Corlette Ciano Jenetta Downer, NP , DNP  This chart was dictated using voice recognition software. Despite best efforts to proofread, errors can occur which can change the documentation meaning.   COVID-19 PATIENT SCREENING TOOL Asked and negative response unless otherwise noted:   Have you had symptoms of covid, tested positive or been in contact with someone with symptoms/positive test in the past 5-10 days? negative

## 2020-05-23 DIAGNOSIS — G8929 Other chronic pain: Secondary | ICD-10-CM | POA: Diagnosis not present

## 2020-05-23 DIAGNOSIS — I129 Hypertensive chronic kidney disease with stage 1 through stage 4 chronic kidney disease, or unspecified chronic kidney disease: Secondary | ICD-10-CM | POA: Diagnosis not present

## 2020-05-23 DIAGNOSIS — F0390 Unspecified dementia without behavioral disturbance: Secondary | ICD-10-CM | POA: Diagnosis not present

## 2020-05-23 DIAGNOSIS — M5416 Radiculopathy, lumbar region: Secondary | ICD-10-CM | POA: Diagnosis not present

## 2020-05-23 DIAGNOSIS — S22069D Unspecified fracture of T7-T8 vertebra, subsequent encounter for fracture with routine healing: Secondary | ICD-10-CM | POA: Diagnosis not present

## 2020-05-23 DIAGNOSIS — M48061 Spinal stenosis, lumbar region without neurogenic claudication: Secondary | ICD-10-CM | POA: Diagnosis not present

## 2020-05-24 DIAGNOSIS — M48061 Spinal stenosis, lumbar region without neurogenic claudication: Secondary | ICD-10-CM | POA: Diagnosis not present

## 2020-05-24 DIAGNOSIS — G8929 Other chronic pain: Secondary | ICD-10-CM | POA: Diagnosis not present

## 2020-05-24 DIAGNOSIS — I129 Hypertensive chronic kidney disease with stage 1 through stage 4 chronic kidney disease, or unspecified chronic kidney disease: Secondary | ICD-10-CM | POA: Diagnosis not present

## 2020-05-24 DIAGNOSIS — S22069D Unspecified fracture of T7-T8 vertebra, subsequent encounter for fracture with routine healing: Secondary | ICD-10-CM | POA: Diagnosis not present

## 2020-05-24 DIAGNOSIS — F0390 Unspecified dementia without behavioral disturbance: Secondary | ICD-10-CM | POA: Diagnosis not present

## 2020-05-24 DIAGNOSIS — M5416 Radiculopathy, lumbar region: Secondary | ICD-10-CM | POA: Diagnosis not present

## 2020-05-25 DIAGNOSIS — M5416 Radiculopathy, lumbar region: Secondary | ICD-10-CM | POA: Diagnosis not present

## 2020-05-25 DIAGNOSIS — I129 Hypertensive chronic kidney disease with stage 1 through stage 4 chronic kidney disease, or unspecified chronic kidney disease: Secondary | ICD-10-CM | POA: Diagnosis not present

## 2020-05-25 DIAGNOSIS — F0390 Unspecified dementia without behavioral disturbance: Secondary | ICD-10-CM | POA: Diagnosis not present

## 2020-05-25 DIAGNOSIS — G8929 Other chronic pain: Secondary | ICD-10-CM | POA: Diagnosis not present

## 2020-05-25 DIAGNOSIS — S22069D Unspecified fracture of T7-T8 vertebra, subsequent encounter for fracture with routine healing: Secondary | ICD-10-CM | POA: Diagnosis not present

## 2020-05-25 DIAGNOSIS — M48061 Spinal stenosis, lumbar region without neurogenic claudication: Secondary | ICD-10-CM | POA: Diagnosis not present

## 2020-05-31 DIAGNOSIS — M5416 Radiculopathy, lumbar region: Secondary | ICD-10-CM | POA: Diagnosis not present

## 2020-05-31 DIAGNOSIS — S22069D Unspecified fracture of T7-T8 vertebra, subsequent encounter for fracture with routine healing: Secondary | ICD-10-CM | POA: Diagnosis not present

## 2020-05-31 DIAGNOSIS — I129 Hypertensive chronic kidney disease with stage 1 through stage 4 chronic kidney disease, or unspecified chronic kidney disease: Secondary | ICD-10-CM | POA: Diagnosis not present

## 2020-05-31 DIAGNOSIS — M48061 Spinal stenosis, lumbar region without neurogenic claudication: Secondary | ICD-10-CM | POA: Diagnosis not present

## 2020-05-31 DIAGNOSIS — G8929 Other chronic pain: Secondary | ICD-10-CM | POA: Diagnosis not present

## 2020-05-31 DIAGNOSIS — F0390 Unspecified dementia without behavioral disturbance: Secondary | ICD-10-CM | POA: Diagnosis not present

## 2020-06-02 DIAGNOSIS — G8929 Other chronic pain: Secondary | ICD-10-CM | POA: Diagnosis not present

## 2020-06-02 DIAGNOSIS — M5416 Radiculopathy, lumbar region: Secondary | ICD-10-CM | POA: Diagnosis not present

## 2020-06-02 DIAGNOSIS — I129 Hypertensive chronic kidney disease with stage 1 through stage 4 chronic kidney disease, or unspecified chronic kidney disease: Secondary | ICD-10-CM | POA: Diagnosis not present

## 2020-06-02 DIAGNOSIS — M48061 Spinal stenosis, lumbar region without neurogenic claudication: Secondary | ICD-10-CM | POA: Diagnosis not present

## 2020-06-02 DIAGNOSIS — F0390 Unspecified dementia without behavioral disturbance: Secondary | ICD-10-CM | POA: Diagnosis not present

## 2020-06-02 DIAGNOSIS — S22069D Unspecified fracture of T7-T8 vertebra, subsequent encounter for fracture with routine healing: Secondary | ICD-10-CM | POA: Diagnosis not present

## 2020-06-07 DIAGNOSIS — F0390 Unspecified dementia without behavioral disturbance: Secondary | ICD-10-CM | POA: Diagnosis not present

## 2020-06-07 DIAGNOSIS — M48061 Spinal stenosis, lumbar region without neurogenic claudication: Secondary | ICD-10-CM | POA: Diagnosis not present

## 2020-06-07 DIAGNOSIS — S22069D Unspecified fracture of T7-T8 vertebra, subsequent encounter for fracture with routine healing: Secondary | ICD-10-CM | POA: Diagnosis not present

## 2020-06-07 DIAGNOSIS — M5416 Radiculopathy, lumbar region: Secondary | ICD-10-CM | POA: Diagnosis not present

## 2020-06-07 DIAGNOSIS — G8929 Other chronic pain: Secondary | ICD-10-CM | POA: Diagnosis not present

## 2020-06-07 DIAGNOSIS — I129 Hypertensive chronic kidney disease with stage 1 through stage 4 chronic kidney disease, or unspecified chronic kidney disease: Secondary | ICD-10-CM | POA: Diagnosis not present

## 2020-06-08 ENCOUNTER — Ambulatory Visit (INDEPENDENT_AMBULATORY_CARE_PROVIDER_SITE_OTHER): Payer: Medicare Other | Admitting: Family Medicine

## 2020-06-08 ENCOUNTER — Other Ambulatory Visit: Payer: Self-pay

## 2020-06-08 ENCOUNTER — Encounter: Payer: Self-pay | Admitting: Family Medicine

## 2020-06-08 VITALS — BP 125/82 | HR 80 | Ht 66.0 in | Wt 126.0 lb

## 2020-06-08 DIAGNOSIS — G8929 Other chronic pain: Secondary | ICD-10-CM | POA: Diagnosis not present

## 2020-06-08 DIAGNOSIS — K219 Gastro-esophageal reflux disease without esophagitis: Secondary | ICD-10-CM | POA: Diagnosis not present

## 2020-06-08 DIAGNOSIS — N183 Chronic kidney disease, stage 3 unspecified: Secondary | ICD-10-CM | POA: Diagnosis not present

## 2020-06-08 DIAGNOSIS — I129 Hypertensive chronic kidney disease with stage 1 through stage 4 chronic kidney disease, or unspecified chronic kidney disease: Secondary | ICD-10-CM | POA: Diagnosis not present

## 2020-06-08 DIAGNOSIS — M545 Low back pain, unspecified: Secondary | ICD-10-CM | POA: Diagnosis not present

## 2020-06-08 DIAGNOSIS — S22000S Wedge compression fracture of unspecified thoracic vertebra, sequela: Secondary | ICD-10-CM

## 2020-06-08 DIAGNOSIS — Z9889 Other specified postprocedural states: Secondary | ICD-10-CM | POA: Diagnosis not present

## 2020-06-08 MED ORDER — OXYCODONE-ACETAMINOPHEN 5-325 MG PO TABS: 1.0000 | ORAL_TABLET | Freq: Four times a day (QID) | ORAL | 0 refills | Status: DC | PRN

## 2020-06-08 MED ORDER — GABAPENTIN 100 MG PO CAPS: 100.0000 mg | ORAL_CAPSULE | Freq: Two times a day (BID) | ORAL | 1 refills | Status: DC

## 2020-06-08 MED ORDER — PANTOPRAZOLE SODIUM 40 MG PO TBEC: 40.0000 mg | DELAYED_RELEASE_TABLET | Freq: Every day | ORAL | 3 refills | Status: DC

## 2020-06-08 MED ORDER — LISINOPRIL 20 MG PO TABS: 20.0000 mg | ORAL_TABLET | Freq: Every day | ORAL | 3 refills | Status: DC

## 2020-06-08 NOTE — Progress Notes (Signed)
Subjective:    Patient ID: Leslie Johnston, female    DOB: 1925/05/15, 87 y.o.   MRN: [id number]  Leslie Johnston is a 87 y.o. female presenting on 06/08/2020 for Hospitalization Follow-up and Back Pain   HPI   HOSPITAL FOLLOW-UP VISIT  Hospital/Location: [facility] Date of Admission: 05/15/20 Date of Discharge: 05/17/20 Transitions of care telephone call: Not completed.  Reason for Admission: Abdominal / Back Pain / Compression Fractures Primary (+Secondary) Diagnosis: Thoracic Compression Fractures  FOLLOW-UP  - Hospital H&P and Discharge Summary have been reviewed - Patient presents today about 3-4 weeks after recent hospitalization. Brief summary of recent course, patient had symptoms of back and abdominal pain. She was hospitalized, had imaging showed compression fractures T spine multiple locations. She had prior kyphoplasty per ortho. In hospital pain was managed, had MRI imaging. She was discharged with follow-up  - Today reports overall has done well after discharge. But still has abdominal and back pain.   - New medications on discharge: Oxycodone-Acetaminophen 5/325mg  PRN - it did seem to cause sleepiness but improved pain. Taking only in PM at night.  - Changes to current meds on discharge: They stopped Gabapentin on discharge was told not to take with oxycodone at same time.  She has seen Dr Alba Destine [facility] Physiatry injection [facility] Lumbar 04/2020 previously has not scheduled return visit.  Followed by Dr Rockey Situ [facility] Cardiology for Paroxysmal AFib, has been placed on blood thinner, and reduced BP medication.  I have reviewed the discharge medication list, and have reconciled the current and discharge medications today.   Depression screen [facility] 2/9 04/15/2019 04/15/2019 08/26/2018  Decreased Interest 0 0 0  Down, Depressed, Hopeless 0 0 0  PHQ - 2 Score 0 0 0  Altered sleeping 0 - -  Tired, decreased energy 0 - -  Change in appetite 0 - -  Feeling bad or failure  about yourself  0 - -  Trouble concentrating 0 - -  Moving slowly or fidgety/restless 0 - -  Suicidal thoughts 0 - -  PHQ-9 Score 0 - -  Difficult doing work/chores Not difficult at all - -    Social History   Tobacco Use  . Smoking status: Never Smoker  . Smokeless tobacco: Never Used  Vaping Use  . Vaping Use: Never used  Substance Use Topics  . Alcohol use: No    Alcohol/week: 0.0 standard drinks  . Drug use: No    Review of Systems Per HPI unless specifically indicated above     Objective:    BP 125/82   Pulse 80   Ht 5\' 6"  (1.676 m)   Wt 126 lb (57.2 kg)   SpO2 100%   BMI 20.34 kg/m   Wt Readings from Last 3 Encounters:  06/08/20 126 lb (57.2 kg)  05/17/20 118 lb 13.3 oz (53.9 kg)  04/05/20 128 lb 2 oz (58.1 kg)    Physical Exam Vitals and nursing note reviewed.  Constitutional:      General: She is not in acute distress.    Appearance: She is well-developed. She is not diaphoretic.     Comments: Thin chronically ill appearing elderly 87 year old female, uncomfortable with some pain mostly mild today, cooperative, uses cane to stand  HENT:     Head: Normocephalic and atraumatic.  Eyes:     General:        Right eye: No discharge.        Left eye: No discharge.  Conjunctiva/sclera: Conjunctivae normal.  Neck:     Thyroid: No thyromegaly.  Cardiovascular:     Rate and Rhythm: Normal rate and regular rhythm.     Heart sounds: Normal heart sounds. No murmur heard.   Pulmonary:     Effort: Pulmonary effort is normal. No respiratory distress.     Breath sounds: Normal breath sounds. No wheezing or rales.  Abdominal:     General: Bowel sounds are normal. There is no distension.     Palpations: Abdomen is soft.     Tenderness: There is no abdominal tenderness.  Musculoskeletal:        General: Normal range of motion.     Cervical back: Normal range of motion and neck supple.     Comments: Uses cane and able to stand up from seated alone. She has a  curvature to back at baseline.  Lymphadenopathy:     Cervical: No cervical adenopathy.  Skin:    General: Skin is warm and dry.     Findings: No erythema or rash.  Neurological:     Mental Status: She is alert and oriented to person, place, and time.  Psychiatric:        Behavior: Behavior normal.     Comments: Well groomed, good eye contact, normal speech and thoughts      I have personally reviewed the radiology report from 05/16/20 MRI.   CLINICAL DATA:  Intervertebral disc degeneration, T-spine fracture. Compression fracture, L-spine.  EXAM: MRI THORACIC AND LUMBAR SPINE WITHOUT AND WITH CONTRAST  TECHNIQUE: Multiplanar and multiecho pulse sequences of the thoracic and lumbar spine were obtained without and with intravenous contrast.  CONTRAST:  23mL GADAVIST GADOBUTROL 1 MMOL/ML IV SOLN  COMPARISON:  MRI of the lumbar spine March 31, 2020.  FINDINGS: MRI THORACIC SPINE FINDINGS  Alignment: Mild kyphosis in the lower thoracic spine centered at T11.  Vertebrae: Compression fractures of T7, T8 and T10 with associated marrow edema and contrast in enhancement, consistent with acute/subacute processes. There is 40% and 20% height loss at T8 and T10 without significant height loss at T7. No retropulsion. Chronic compression fractures at T11 and T12 status post cement augmentation at T12.  Cord: Evaluation of the cord is limited by motion artifact. No gross cord signal abnormality.  Paraspinal and other soft tissues: Moderate hiatal hernia.  Disc levels:  Small retropulsion of the superior aspect of the T12 posterior wall into the spinal canal causing indentation of the thecal sac without significant spinal canal stenosis. Otherwise, no significant disc herniation or high-grade spinal canal or neural foraminal stenosis.  MRI LUMBAR SPINE FINDINGS  Segmentation:  Standard.  Alignment:  Physiologic.  Vertebrae: No acute fracture, evidence of  discitis, or bone lesion. Chronic compression fracture of the L1 with loss of approximately 60% of vertebral body height anteriorly, status post vertebral augmentation. Chronic compression fracture of L2 with loss of approximately 50% of vertebral body height.  Conus medullaris: Extends to the L1 level and appears normal.  Paraspinal and other soft tissues: Bilateral renal cysts.  Disc levels:  T12-L1: Disc bulge, facet degenerative changes and retropulsion of the superior aspect of the L1 posterior wall causing indentation of the thecal sac and resulting in mild spinal canal stenosis. Mild right and moderate left neural foraminal narrowing.  L1-2: Disc bulge, facet degenerative change ligamentum flavum redundancy resulting in mild spinal canal stenosis and mild bilateral neural foraminal narrowing.  L2-3: Disc bulge, facet degenerative change ligamentum flavum redundancy resulting in narrowing of  the bilateral subarticular zones and mild bilateral neural foraminal narrowing.  L3-4: Disc bulge, facet degenerative changes and ligamentum flavum redundancy resulting in narrowing of the bilateral subarticular zones and mild bilateral neural foraminal narrowing.  L4-5: Disc bulge, facet degenerative changes and ligamentum flavum redundancy resulting in narrowing of the bilateral subarticular zones and mild bilateral neural foraminal narrowing.  L5-S1: Shallow disc bulge and mild facet degenerative changes. No spinal canal or neural foraminal stenosis.  IMPRESSION: 1. Acute/subacute compression fractures at T7, T8 and T10 with approximately 40% and 20% height loss at T8 and T10, respectively. No retropulsion. 2. Chronic compression fractures status post vertebral augmentation at T12 and L1, unchanged. 3. Degenerative changes of the lumbar spine, stable from prior MRI.   Electronically Signed   By: Pedro Earls M.D.   On: 05/16/2020  17:08  -----------------------  CLINICAL DATA:  Intervertebral disc degeneration, T-spine fracture. Compression fracture, L-spine.  EXAM: MRI THORACIC AND LUMBAR SPINE WITHOUT AND WITH CONTRAST  TECHNIQUE: Multiplanar and multiecho pulse sequences of the thoracic and lumbar spine were obtained without and with intravenous contrast.  CONTRAST:  38mL GADAVIST GADOBUTROL 1 MMOL/ML IV SOLN  COMPARISON:  MRI of the lumbar spine March 31, 2020.  FINDINGS: MRI THORACIC SPINE FINDINGS  Alignment: Mild kyphosis in the lower thoracic spine centered at T11.  Vertebrae: Compression fractures of T7, T8 and T10 with associated marrow edema and contrast in enhancement, consistent with acute/subacute processes. There is 40% and 20% height loss at T8 and T10 without significant height loss at T7. No retropulsion. Chronic compression fractures at T11 and T12 status post cement augmentation at T12.  Cord: Evaluation of the cord is limited by motion artifact. No gross cord signal abnormality.  Paraspinal and other soft tissues: Moderate hiatal hernia.  Disc levels:  Small retropulsion of the superior aspect of the T12 posterior wall into the spinal canal causing indentation of the thecal sac without significant spinal canal stenosis. Otherwise, no significant disc herniation or high-grade spinal canal or neural foraminal stenosis.  MRI LUMBAR SPINE FINDINGS  Segmentation:  Standard.  Alignment:  Physiologic.  Vertebrae: No acute fracture, evidence of discitis, or bone lesion. Chronic compression fracture of the L1 with loss of approximately 60% of vertebral body height anteriorly, status post vertebral augmentation. Chronic compression fracture of L2 with loss of approximately 50% of vertebral body height.  Conus medullaris: Extends to the L1 level and appears normal.  Paraspinal and other soft tissues: Bilateral renal cysts.  Disc levels:  T12-L1: Disc  bulge, facet degenerative changes and retropulsion of the superior aspect of the L1 posterior wall causing indentation of the thecal sac and resulting in mild spinal canal stenosis. Mild right and moderate left neural foraminal narrowing.  L1-2: Disc bulge, facet degenerative change ligamentum flavum redundancy resulting in mild spinal canal stenosis and mild bilateral neural foraminal narrowing.  L2-3: Disc bulge, facet degenerative change ligamentum flavum redundancy resulting in narrowing of the bilateral subarticular zones and mild bilateral neural foraminal narrowing.  L3-4: Disc bulge, facet degenerative changes and ligamentum flavum redundancy resulting in narrowing of the bilateral subarticular zones and mild bilateral neural foraminal narrowing.  L4-5: Disc bulge, facet degenerative changes and ligamentum flavum redundancy resulting in narrowing of the bilateral subarticular zones and mild bilateral neural foraminal narrowing.  L5-S1: Shallow disc bulge and mild facet degenerative changes. No spinal canal or neural foraminal stenosis.  IMPRESSION: 1. Acute/subacute compression fractures at T7, T8 and T10 with approximately 40% and 20%  height loss at T8 and T10, respectively. No retropulsion. 2. Chronic compression fractures status post vertebral augmentation at T12 and L1, unchanged. 3. Degenerative changes of the lumbar spine, stable from prior MRI.   Electronically Signed   By: Pedro Earls M.D.   On: 05/16/2020 17:08   Results for orders placed or performed during the hospital encounter of 05/15/20  SARS CORONAVIRUS 2 (TAT 6-24 HRS) Nasopharyngeal Nasopharyngeal Swab   Specimen: Nasopharyngeal Swab  Result Value Ref Range   SARS Coronavirus 2 NEGATIVE NEGATIVE  CBC  Result Value Ref Range   WBC 8.4 4.0 - 10.5 K/uL   RBC 4.56 3.87 - 5.11 MIL/uL   Hemoglobin 12.6 12.0 - 15.0 g/dL   HCT 40.2 36.0 - 46.0 %   MCV 88.2 80.0 - 100.0 fL    MCH 27.6 26.0 - 34.0 pg   MCHC 31.3 30.0 - 36.0 g/dL   RDW 13.6 11.5 - 15.5 %   Platelets 222 150 - 400 K/uL   nRBC 0.0 0.0 - 0.2 %  Urinalysis, Complete w Microscopic  Result Value Ref Range   Color, Urine STRAW (A) YELLOW   APPearance CLEAR (A) CLEAR   Specific Gravity, Urine 1.004 (L) 1.005 - 1.030   pH 8.0 5.0 - 8.0   Glucose, UA NEGATIVE NEGATIVE mg/dL   Hgb urine dipstick NEGATIVE NEGATIVE   Bilirubin Urine NEGATIVE NEGATIVE   Ketones, ur NEGATIVE NEGATIVE mg/dL   Protein, ur NEGATIVE NEGATIVE mg/dL   Nitrite NEGATIVE NEGATIVE   Leukocytes,Ua NEGATIVE NEGATIVE   RBC / HPF 0-5 0 - 5 RBC/hpf   WBC, UA 0-5 0 - 5 WBC/hpf   Bacteria, UA NONE SEEN NONE SEEN   Squamous Epithelial / LPF 0-5 0 - 5  Comprehensive metabolic panel  Result Value Ref Range   Sodium 138 135 - 145 mmol/L   Potassium 4.3 3.5 - 5.1 mmol/L   Chloride 105 98 - 111 mmol/L   CO2 26 22 - 32 mmol/L   Glucose, Bld 116 (H) 70 - 99 mg/dL   BUN 15 8 - 23 mg/dL   Creatinine, Ser 0.75 0.44 - 1.00 mg/dL   Calcium 9.3 8.9 - 10.3 mg/dL   Total Protein 6.5 6.5 - 8.1 g/dL   Albumin 3.5 3.5 - 5.0 g/dL   AST 24 15 - 41 U/L   ALT 12 0 - 44 U/L   Alkaline Phosphatase 72 38 - 126 U/L   Total Bilirubin 0.6 0.3 - 1.2 mg/dL   GFR, Estimated >60 >60 mL/min   Anion gap 7 5 - 15  Lipase, blood  Result Value Ref Range   Lipase 33 11 - 51 U/L  Magnesium  Result Value Ref Range   Magnesium 1.9 1.7 - 2.4 mg/dL  Lipase, blood  Result Value Ref Range   Lipase 54 (H) 11 - 51 U/L  Magnesium  Result Value Ref Range   Magnesium 2.3 1.7 - 2.4 mg/dL  Comprehensive metabolic panel  Result Value Ref Range   Sodium 140 135 - 145 mmol/L   Potassium 4.2 3.5 - 5.1 mmol/L   Chloride 107 98 - 111 mmol/L   CO2 28 22 - 32 mmol/L   Glucose, Bld 104 (H) 70 - 99 mg/dL   BUN 14 8 - 23 mg/dL   Creatinine, Ser 0.71 0.44 - 1.00 mg/dL   Calcium 9.3 8.9 - 10.3 mg/dL   Total Protein 6.3 (L) 6.5 - 8.1 g/dL   Albumin 3.3 (L) 3.5 - 5.0  g/dL    AST 18 15 - 41 U/L   ALT 11 0 - 44 U/L   Alkaline Phosphatase 69 38 - 126 U/L   Total Bilirubin 0.7 0.3 - 1.2 mg/dL   GFR, Estimated >60 >60 mL/min   Anion gap 5 5 - 15  CBC  Result Value Ref Range   WBC 6.7 4.0 - 10.5 K/uL   RBC 4.38 3.87 - 5.11 MIL/uL   Hemoglobin 12.2 12.0 - 15.0 g/dL   HCT 39.0 36.0 - 46.0 %   MCV 89.0 80.0 - 100.0 fL   MCH 27.9 26.0 - 34.0 pg   MCHC 31.3 30.0 - 36.0 g/dL   RDW 13.4 11.5 - 15.5 %   Platelets 209 150 - 400 K/uL   nRBC 0.0 0.0 - 0.2 %  Lactate dehydrogenase, isoenzymes  Result Value Ref Range   LDH 1 26 17  - 32 %   LDH 2 35 25 - 40 %   LDH 3 20 17  - 27 %   LDH 4 11 5  - 13 %   LDH 5 8 4  - 20 %   LDH Isoenzymes, Total 171 119 - 226 IU/L  Troponin I (High Sensitivity)  Result Value Ref Range   Troponin I (High Sensitivity) 5 <18 ng/L  Troponin I (High Sensitivity)  Result Value Ref Range   Troponin I (High Sensitivity) 6 <18 ng/L      Assessment & Plan:   Problem List Items Addressed This Visit    GERD (gastroesophageal reflux disease)   Relevant Medications   pantoprazole (PROTONIX) 40 MG tablet   Chronic bilateral low back pain without sciatica   Relevant Medications   gabapentin (NEURONTIN) 100 MG capsule   oxyCODONE-acetaminophen (PERCOCET/ROXICET) 5-325 MG tablet   Benign hypertension with CKD (chronic kidney disease) stage III   Relevant Medications   lisinopril (ZESTRIL) 20 MG tablet    Other Visit Diagnoses    Compression fracture of thoracic vertebra, unspecified thoracic vertebral level, sequela    -  Primary   Relevant Medications   oxyCODONE-acetaminophen (PERCOCET/ROXICET) 5-325 MG tablet   History of kyphoplasty          Hospital follow up Compression fractures, multiple Thoracic spine OA/DJD Pain improved, seems to have persistent back / frontal-abdominal radiated pain, but able to function. Difficulty with cognitive status and her reporting of pain.  Followed by Marvell Fuller. Has seen [facility] Physiatry Dr Alba Destine  already for [facility].  Seems improved on Oxycodone PRN QHS ONLY. Will re order today, reviewed PDMP, expressed caution, this is not long term solution right now. But can bridge until further intervention from [facility] / Ortho.  She has [facility] team on board. With Hanley Ben NP. Encouraged continue home visit follow-up with them for further pain management advice and consult, and coordinate with me to help manage her pain appropriately.  Re order Gabapentin low dose ONLY during daytime. AM and afternoon, avoid PM dose since taking Oxycodone in PM, avoid taking together. Caution sedation.  Continue Tylenol PRN.  May benefit from supplemental oxygen in future, can follow w/ Palliative and determine O2 requirement if still indicated in future. May warrant PM oxygen only or PRN.  Follow-up as advised.  Meds ordered this encounter  Medications  . pantoprazole (PROTONIX) 40 MG tablet    Sig: Take 1 tablet (40 mg total) by mouth daily before breakfast.    Dispense:  90 tablet    Refill:  3  . lisinopril (ZESTRIL) 20  MG tablet    Sig: Take 1 tablet (20 mg total) by mouth daily.    Dispense:  90 tablet    Refill:  3  . gabapentin (NEURONTIN) 100 MG capsule    Sig: Take 1 capsule (100 mg total) by mouth 2 (two) times daily. Gradually increase from 1 pill twice a day (morning and afternoon only) to up to 2-3 pills per dose if tolerated    Dispense:  180 capsule    Refill:  1  . oxyCODONE-acetaminophen (PERCOCET/ROXICET) 5-325 MG tablet    Sig: Take 1 tablet by mouth every 6 (six) hours as needed for moderate pain.    Dispense:  30 tablet    Refill:  0      Follow up plan: Return in about 3 months (around 09/08/2020) for 3 month follow-up Back Pain / Physiatry/Ortho updates.   Nobie Putnam, [facility] 06/08/2020, 3:47 PM

## 2020-06-08 NOTE — Patient Instructions (Addendum)
Thank you for coming to the office today.  Start back on Gabapentin 100mg  capsules take 1 in morning and 1 in afternoon gradually adjust this, as tolerated.  Refilled other meds lisinopril pantoprazole  Return to dr Alba Destine for possible injections procedures etc.  Also keep up with Amy (Palliative Care) and review pain management plan or recommendations for me to get involved.  I have refilled short term Oxycodone for now, maybe this has to be the plan going forward. 30 pills for now we can address in future. Contact us if need more before next apt.  Home oxygen may need to be adjusted or discontinued in future, recommend she can do trial off in future with Amy or here to see if oxygen is still needed or can do night time only oxygen.   Please schedule a Follow-up Appointment to: Return in about 3 months (around 09/08/2020) for 3 month follow-up Back Pain / Physiatry/Ortho updates.  If you have any other questions or concerns, please feel free to call the office or send a message through [place]. You may also schedule an earlier appointment if necessary.  Additionally, you may be receiving a survey about your experience at our office within a few days to 1 week by e-mail or mail. We value your feedback.  Nobie Putnam, DO [place]

## 2020-06-14 DIAGNOSIS — M5416 Radiculopathy, lumbar region: Secondary | ICD-10-CM | POA: Diagnosis not present

## 2020-06-14 DIAGNOSIS — M546 Pain in thoracic spine: Secondary | ICD-10-CM | POA: Diagnosis not present

## 2020-06-14 DIAGNOSIS — M545 Low back pain, unspecified: Secondary | ICD-10-CM | POA: Diagnosis not present

## 2020-06-14 DIAGNOSIS — S22000A Wedge compression fracture of unspecified thoracic vertebra, initial encounter for closed fracture: Secondary | ICD-10-CM | POA: Diagnosis not present

## 2020-06-14 DIAGNOSIS — F0390 Unspecified dementia without behavioral disturbance: Secondary | ICD-10-CM | POA: Diagnosis not present

## 2020-06-14 DIAGNOSIS — I129 Hypertensive chronic kidney disease with stage 1 through stage 4 chronic kidney disease, or unspecified chronic kidney disease: Secondary | ICD-10-CM | POA: Diagnosis not present

## 2020-06-14 DIAGNOSIS — G8929 Other chronic pain: Secondary | ICD-10-CM | POA: Diagnosis not present

## 2020-06-14 DIAGNOSIS — M48061 Spinal stenosis, lumbar region without neurogenic claudication: Secondary | ICD-10-CM | POA: Diagnosis not present

## 2020-06-14 DIAGNOSIS — S22069D Unspecified fracture of T7-T8 vertebra, subsequent encounter for fracture with routine healing: Secondary | ICD-10-CM | POA: Diagnosis not present

## 2020-06-16 DIAGNOSIS — M8000XD Age-related osteoporosis with current pathological fracture, unspecified site, subsequent encounter for fracture with routine healing: Secondary | ICD-10-CM

## 2020-06-16 DIAGNOSIS — S22000S Wedge compression fracture of unspecified thoracic vertebra, sequela: Secondary | ICD-10-CM

## 2020-06-18 DIAGNOSIS — F32A Depression, unspecified: Secondary | ICD-10-CM | POA: Diagnosis not present

## 2020-06-18 DIAGNOSIS — M48061 Spinal stenosis, lumbar region without neurogenic claudication: Secondary | ICD-10-CM | POA: Diagnosis not present

## 2020-06-18 DIAGNOSIS — F0390 Unspecified dementia without behavioral disturbance: Secondary | ICD-10-CM | POA: Diagnosis not present

## 2020-06-18 DIAGNOSIS — Z79891 Long term (current) use of opiate analgesic: Secondary | ICD-10-CM | POA: Diagnosis not present

## 2020-06-18 DIAGNOSIS — S22069D Unspecified fracture of T7-T8 vertebra, subsequent encounter for fracture with routine healing: Secondary | ICD-10-CM | POA: Diagnosis not present

## 2020-06-18 DIAGNOSIS — Z7901 Long term (current) use of anticoagulants: Secondary | ICD-10-CM | POA: Diagnosis not present

## 2020-06-18 DIAGNOSIS — N183 Chronic kidney disease, stage 3 unspecified: Secondary | ICD-10-CM | POA: Diagnosis not present

## 2020-06-18 DIAGNOSIS — R32 Unspecified urinary incontinence: Secondary | ICD-10-CM | POA: Diagnosis not present

## 2020-06-18 DIAGNOSIS — G8929 Other chronic pain: Secondary | ICD-10-CM | POA: Diagnosis not present

## 2020-06-18 DIAGNOSIS — M5416 Radiculopathy, lumbar region: Secondary | ICD-10-CM | POA: Diagnosis not present

## 2020-06-18 DIAGNOSIS — Z9981 Dependence on supplemental oxygen: Secondary | ICD-10-CM | POA: Diagnosis not present

## 2020-06-18 DIAGNOSIS — I129 Hypertensive chronic kidney disease with stage 1 through stage 4 chronic kidney disease, or unspecified chronic kidney disease: Secondary | ICD-10-CM | POA: Diagnosis not present

## 2020-06-18 DIAGNOSIS — I480 Paroxysmal atrial fibrillation: Secondary | ICD-10-CM | POA: Diagnosis not present

## 2020-06-20 ENCOUNTER — Telehealth: Payer: Self-pay | Admitting: Adult Health Nurse Practitioner

## 2020-06-20 NOTE — Telephone Encounter (Signed)
Called to remind of tomorrow's appointment.  Left VM with reason for call and call back info Karan Ramnauth K. Mende Biswell NP 

## 2020-06-21 ENCOUNTER — Encounter: Payer: Self-pay | Admitting: Adult Health Nurse Practitioner

## 2020-06-21 ENCOUNTER — Other Ambulatory Visit: Payer: Self-pay

## 2020-06-21 ENCOUNTER — Other Ambulatory Visit: Payer: Medicare Other | Admitting: Adult Health Nurse Practitioner

## 2020-06-21 VITALS — BP 158/82 | HR 93

## 2020-06-21 DIAGNOSIS — Z515 Encounter for palliative care: Secondary | ICD-10-CM | POA: Diagnosis not present

## 2020-06-21 DIAGNOSIS — M8448XA Pathological fracture, other site, initial encounter for fracture: Secondary | ICD-10-CM | POA: Diagnosis not present

## 2020-06-21 DIAGNOSIS — F0390 Unspecified dementia without behavioral disturbance: Secondary | ICD-10-CM | POA: Diagnosis not present

## 2020-06-21 NOTE — Progress Notes (Signed)
Therapist, nutritional Palliative Care Consult Note Telephone: [phone]  Fax: [phone]    Date of encounter: 06/21/20 PATIENT NAME: Leslie Johnston [address] [place] [address]   [phone] (home)  DOB: Dec 10, 1925 MRN: [id number] PRIMARY CARE PROVIDER:    System, Provider Not In,  No address on file None  REFERRING PROVIDER:   Tarri Fuller, FNP  RESPONSIBLE PARTY:    Contact Information     Name Relation Home Work Mobile   [place]ie [place] Daughter   [phone]   Darreld Mclean Daughter   [phone]        I met face to face with patient and family in home. Palliative Care was asked to follow this patient by consultation request of  Malfi, Jodelle Gross, FNP to address advance care planning and complex medical decision making. This is a follow up visit.   Daughters present during visit today                                    ASSESSMENT AND PLAN / RECOMMENDATIONS:    Advance Care Planning/Goals of Care: Goals include to maximize quality of life and symptom management. CODE STATUS: DNR  Symptom Management/Plan:  Pathological fracture of vertebrae: Pain seems to be in proving not having to take oxycodone very much.  Do encourage getting to the point where she does not need the oxycodone at all.  Daughter did have questions about starting a medication for osteoporosis.  States that PCP put in a referral for specialist to talk about any treatment options for her osteoporosis given her pathological fractures in the vertebrae.  Have encouraged going to initial consultation to go over and evaluate risks versus benefits to determine if they would like to proceed with any treatment for osteoporosis that might help prevent fractures.  Dementia: Patient's dementia seems stable at this time.  No changes in functional status.  Is having slight decrease in appetite.  Patient does have 24/7 help in the home with family.  Continue supportive care at  home   Follow up Palliative Care Visit: Palliative care will continue to follow for complex medical decision making, advance care planning, and clarification of goals. Return 8 weeks or prn. Encouraged to call with any questions or concerns  I spent 60 minutes providing this consultation. More than 50% of the time in this consultation was spent in counseling and care coordination.  PPS: 50%  HOSPICE ELIGIBILITY/DIAGNOSIS: TBD  Chief Complaint: follow up palliative visit  HISTORY OF PRESENT ILLNESS:  Leslie Johnston is a 87 y.o. year old female  with A. fib, HTN, dementia, CKD stage III, overactive bladder, osteoporosis with past pathological fracture of humerus .  Patient is doing well after vertebral fractures.  Daughter does state she is complaining about pain less.  They give her mostly Tylenol when she does complain of pain and in the past 2 weeks is only taken about 3 of her oxycodone at bedtime for more severe pain.  I have only been keeping her oxygen on her at night and she has been satting in the 90s during the day.  Patient does not complain of increased shortness of breath.  Daughter does state that she is not eating as much but does eat when encouraged.  Daughter does state at last doctor's appointment they were told that she has lost 5 pounds.  The 1 04/26/2020 she was 128 pounds  and recorded in epic on 06/08/2020 she was 126 pounds.  Daughter does feel as if she gets plenty to eat for as little activity as she does.  Patient does ambulate with walker and requires minimal assistance with ADLs.  Does have someone, mostly family, who stays with her pretty much 24/7.  Patient has not had any falls since last visit.  Denies headaches, dizziness, chest pain, shortness of breath, cough, N/V/D, constipation, dysuria, hematuria.  History obtained from review of EMR and interview with family and Ms. Mick.    PHYSICAL EXAM:    General: NAD, frail appearing Eyes: Sclera anicteric and  noninjected with no discharge noted ENMT: Moist mucous membranes Cardiovascular: regular rate and rhythm Pulmonary: Lung sounds clear; normal respiratory effort Abdomen: soft, nontender, + bowel sounds Extremities: no edema, no joint deformities Skin: no rashes on exposed skin Neurological:  alert and oriented to person and place   Thank you for the opportunity to participate in the care of Leslie Johnston.  The palliative care team will continue to follow. Please call our office at [phone] if we can be of additional assistance.   Deonne Rooks Jenetta Downer, NP , DNP  This chart was dictated using voice recognition software.  Despite best efforts to proofread,  errors can occur which can change the documentation meaning.   COVID-19 PATIENT SCREENING TOOL Asked and negative response unless otherwise noted:   Have you had symptoms of covid, tested positive or been in contact with someone with symptoms/positive test in the past 5-10 days?

## 2020-06-22 DIAGNOSIS — M48061 Spinal stenosis, lumbar region without neurogenic claudication: Secondary | ICD-10-CM | POA: Diagnosis not present

## 2020-06-22 DIAGNOSIS — F0390 Unspecified dementia without behavioral disturbance: Secondary | ICD-10-CM | POA: Diagnosis not present

## 2020-06-22 DIAGNOSIS — M5416 Radiculopathy, lumbar region: Secondary | ICD-10-CM | POA: Diagnosis not present

## 2020-06-22 DIAGNOSIS — G8929 Other chronic pain: Secondary | ICD-10-CM | POA: Diagnosis not present

## 2020-06-22 DIAGNOSIS — I129 Hypertensive chronic kidney disease with stage 1 through stage 4 chronic kidney disease, or unspecified chronic kidney disease: Secondary | ICD-10-CM | POA: Diagnosis not present

## 2020-06-22 DIAGNOSIS — S22069D Unspecified fracture of T7-T8 vertebra, subsequent encounter for fracture with routine healing: Secondary | ICD-10-CM | POA: Diagnosis not present

## 2020-06-22 NOTE — Addendum Note (Signed)
Addended by: Olin Hauser on: 06/22/2020 11:47 AM   Modules accepted: Orders

## 2020-06-29 DIAGNOSIS — M5416 Radiculopathy, lumbar region: Secondary | ICD-10-CM | POA: Diagnosis not present

## 2020-06-29 DIAGNOSIS — I129 Hypertensive chronic kidney disease with stage 1 through stage 4 chronic kidney disease, or unspecified chronic kidney disease: Secondary | ICD-10-CM | POA: Diagnosis not present

## 2020-06-29 DIAGNOSIS — F0390 Unspecified dementia without behavioral disturbance: Secondary | ICD-10-CM | POA: Diagnosis not present

## 2020-06-29 DIAGNOSIS — M48061 Spinal stenosis, lumbar region without neurogenic claudication: Secondary | ICD-10-CM | POA: Diagnosis not present

## 2020-06-29 DIAGNOSIS — G8929 Other chronic pain: Secondary | ICD-10-CM | POA: Diagnosis not present

## 2020-06-29 DIAGNOSIS — S22069D Unspecified fracture of T7-T8 vertebra, subsequent encounter for fracture with routine healing: Secondary | ICD-10-CM | POA: Diagnosis not present

## 2020-07-06 DIAGNOSIS — G8929 Other chronic pain: Secondary | ICD-10-CM | POA: Diagnosis not present

## 2020-07-06 DIAGNOSIS — M48061 Spinal stenosis, lumbar region without neurogenic claudication: Secondary | ICD-10-CM | POA: Diagnosis not present

## 2020-07-06 DIAGNOSIS — S22069D Unspecified fracture of T7-T8 vertebra, subsequent encounter for fracture with routine healing: Secondary | ICD-10-CM | POA: Diagnosis not present

## 2020-07-06 DIAGNOSIS — I129 Hypertensive chronic kidney disease with stage 1 through stage 4 chronic kidney disease, or unspecified chronic kidney disease: Secondary | ICD-10-CM | POA: Diagnosis not present

## 2020-07-06 DIAGNOSIS — F0390 Unspecified dementia without behavioral disturbance: Secondary | ICD-10-CM | POA: Diagnosis not present

## 2020-07-06 DIAGNOSIS — M5416 Radiculopathy, lumbar region: Secondary | ICD-10-CM | POA: Diagnosis not present

## 2020-07-12 DIAGNOSIS — S22069D Unspecified fracture of T7-T8 vertebra, subsequent encounter for fracture with routine healing: Secondary | ICD-10-CM | POA: Diagnosis not present

## 2020-07-12 DIAGNOSIS — F0390 Unspecified dementia without behavioral disturbance: Secondary | ICD-10-CM | POA: Diagnosis not present

## 2020-07-12 DIAGNOSIS — M48061 Spinal stenosis, lumbar region without neurogenic claudication: Secondary | ICD-10-CM | POA: Diagnosis not present

## 2020-07-12 DIAGNOSIS — M5416 Radiculopathy, lumbar region: Secondary | ICD-10-CM | POA: Diagnosis not present

## 2020-07-12 DIAGNOSIS — G8929 Other chronic pain: Secondary | ICD-10-CM | POA: Diagnosis not present

## 2020-07-12 DIAGNOSIS — I129 Hypertensive chronic kidney disease with stage 1 through stage 4 chronic kidney disease, or unspecified chronic kidney disease: Secondary | ICD-10-CM | POA: Diagnosis not present

## 2020-07-24 ENCOUNTER — Other Ambulatory Visit: Payer: Self-pay | Admitting: Family Medicine

## 2020-07-24 DIAGNOSIS — I129 Hypertensive chronic kidney disease with stage 1 through stage 4 chronic kidney disease, or unspecified chronic kidney disease: Secondary | ICD-10-CM

## 2020-07-24 DIAGNOSIS — N183 Chronic kidney disease, stage 3 unspecified: Secondary | ICD-10-CM

## 2020-07-24 NOTE — Telephone Encounter (Signed)
last RF 06/08/20 #90 3 RF-too soon

## 2020-08-02 DIAGNOSIS — M810 Age-related osteoporosis without current pathological fracture: Secondary | ICD-10-CM | POA: Diagnosis not present

## 2020-08-04 IMAGING — DX DG LUMBAR SPINE COMPLETE 4+V
6 series · 6 of 6 positions shown · non-contrast
Comparison: Lumbar spine radiographs from 09/07/2017

CLINICAL DATA: Low back pain and left hip pain for 2-3 months.

EXAM:
LUMBAR SPINE - COMPLETE 4+ VIEW

[l-spine ap]
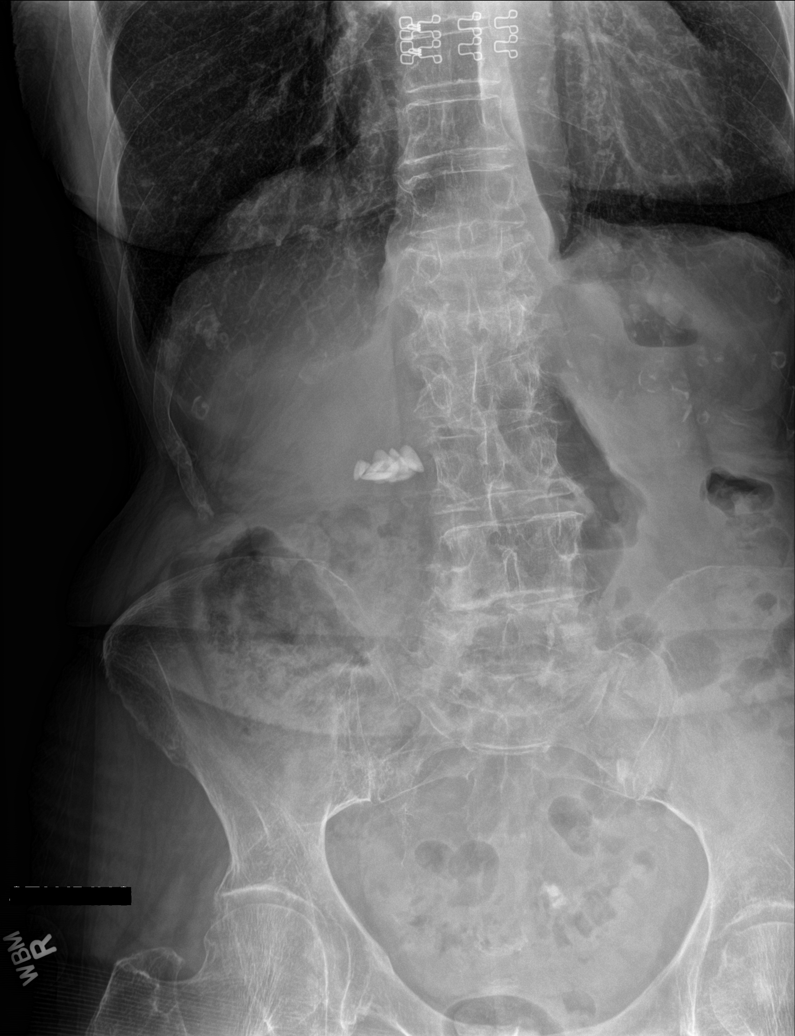

[l-spine obl (1 of 3)]
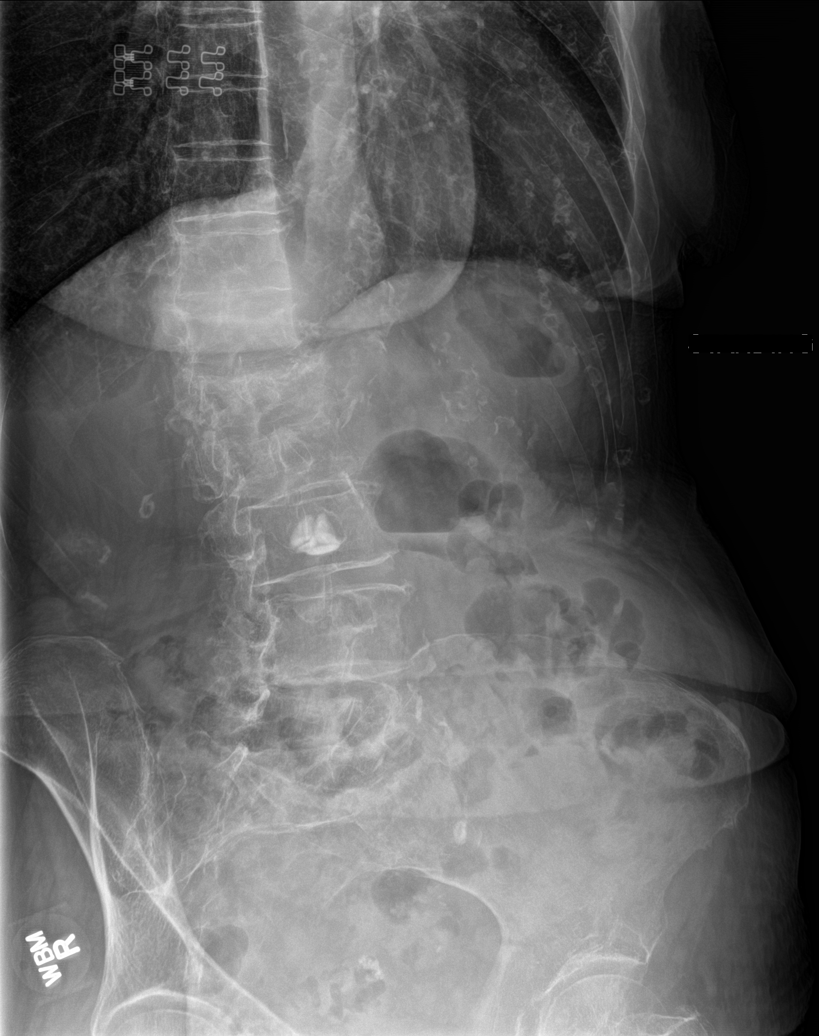

[l-spine obl (2 of 3)]
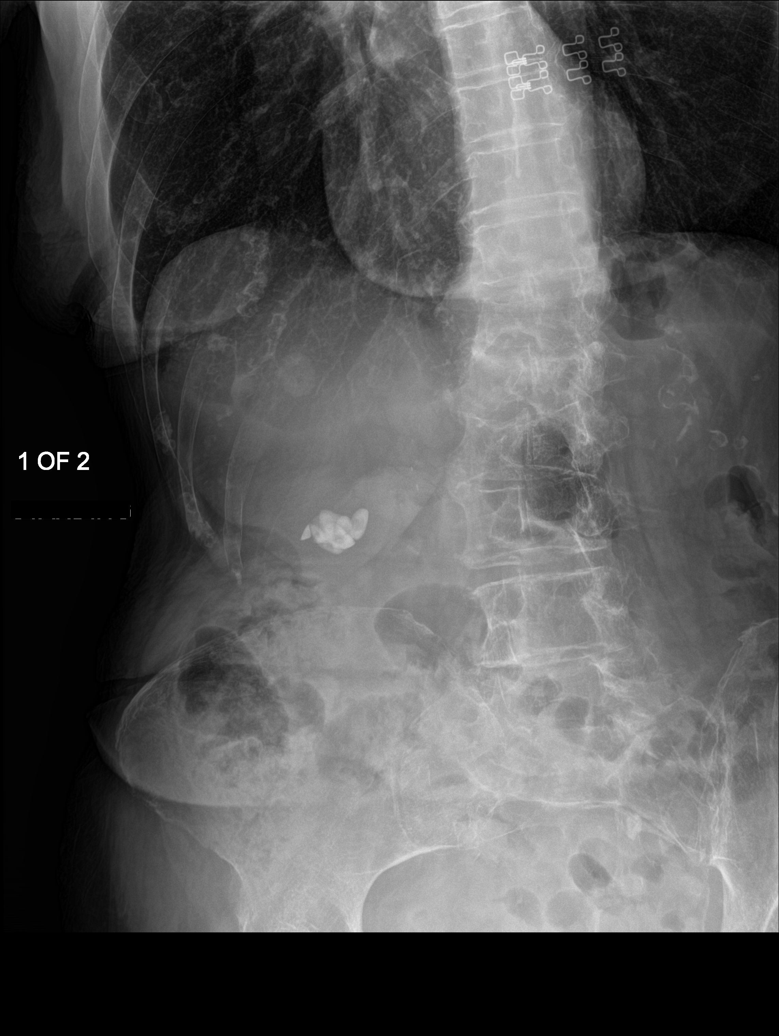

[l-spine lat (1 of 2)]
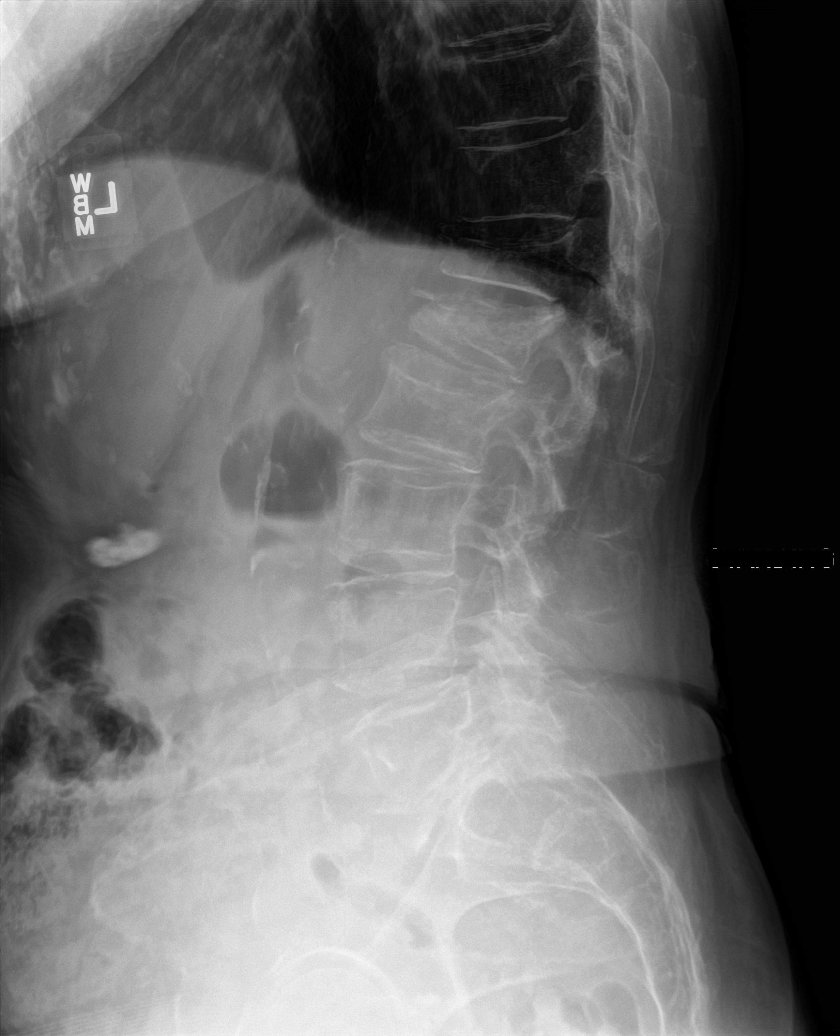

[l-spine obl (3 of 3)]
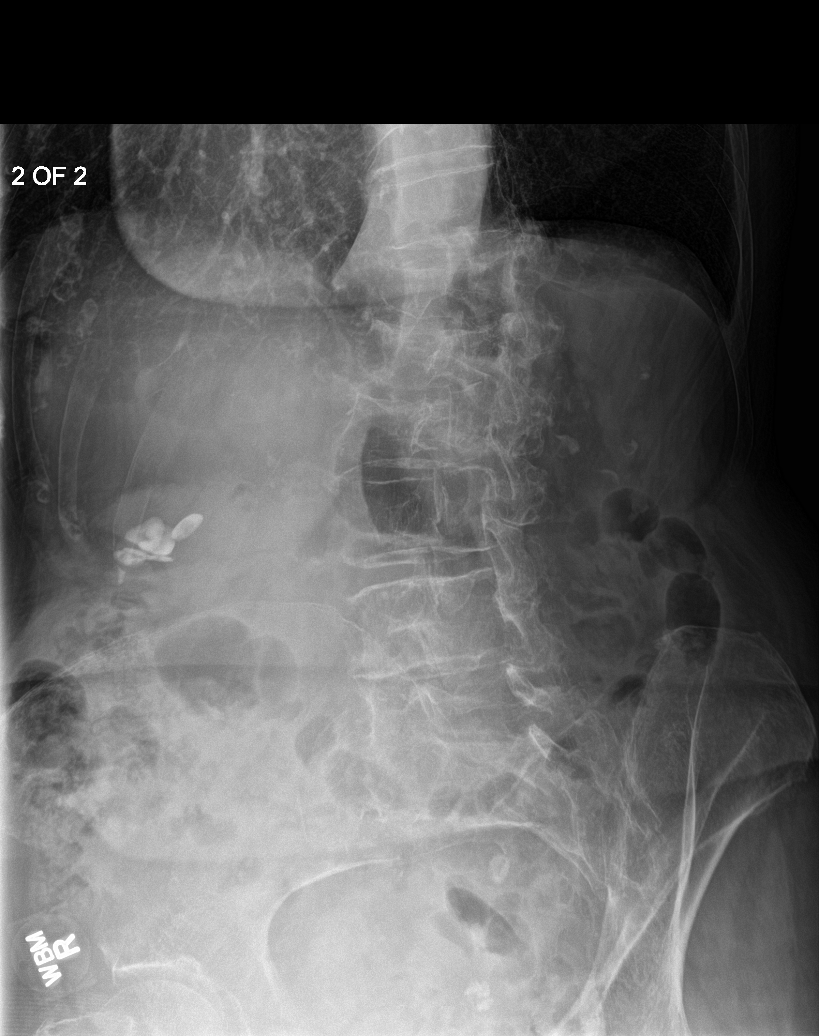

[l-spine lat (2 of 2)]
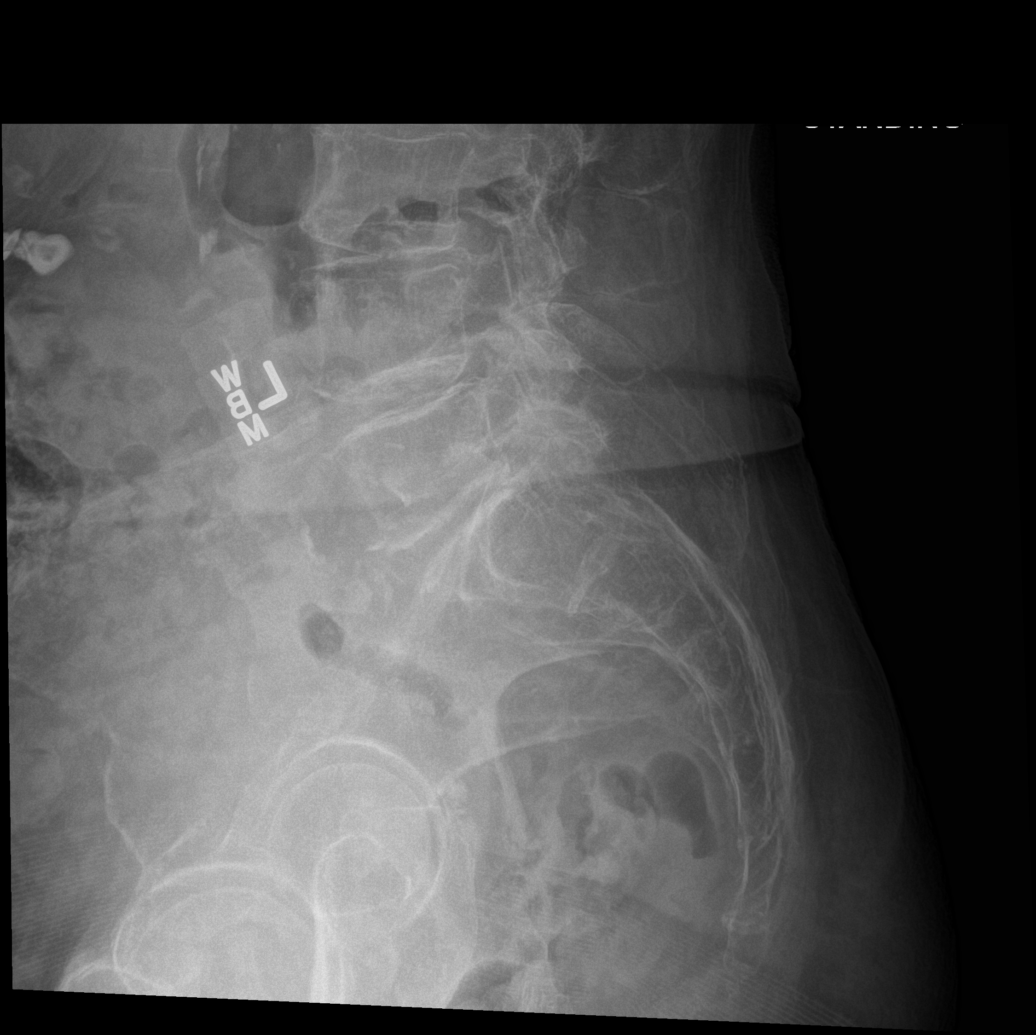

[6 of 6 positions shown; findings below may reference images not displayed]

FINDINGS: Clustered gallstones in the right upper quadrant. Diffuse bony
demineralization.

50% superior compression fracture of T12 is new compared to
09/07/2017.

60% superior endplate compression fracture at L1 is new compared to
09/07/2017.

Subtle superior endplate compression at L2 is essentially stable.

Aortoiliac atherosclerotic vascular disease. Splenic artery
atherosclerotic calcification.

3 mm grade 1 anterolisthesis at L4-5.  Loss of disc height at L5-S1.

Right ninth and tenth anterior rib irregularities compatible with
age-indeterminate fractures.
IMPRESSION: 1. New 50% superior endplate compression fracture at T12 and new 60%
superior endplate compression fracture at L1. These were not
appreciable on 09/07/2017.
2. Subtle superior endplate compression at L2, essentially stable.
3.  Aortic Atherosclerosis ([id number]).
4. Cholelithiasis.
5. Diffuse bony demineralization.  Osteoporosis.
6. Age-indeterminate fractures of the right anterior ninth and tenth
ribs.

## 2020-08-10 DIAGNOSIS — M810 Age-related osteoporosis without current pathological fracture: Secondary | ICD-10-CM | POA: Diagnosis not present

## 2020-08-12 IMAGING — XA DG C-ARM 61-120 MIN
1 series · 1 of 1 positions shown · non-contrast
Comparison: 09/07/2017.

CLINICAL DATA: Kyphoplasty.

EXAM:
LUMBAR SPINE - 2-3 VIEW; DG C-ARM 61-120 MIN

[Series 1: ortho standard · 1 of 1 slices shown]
[im 1/1]
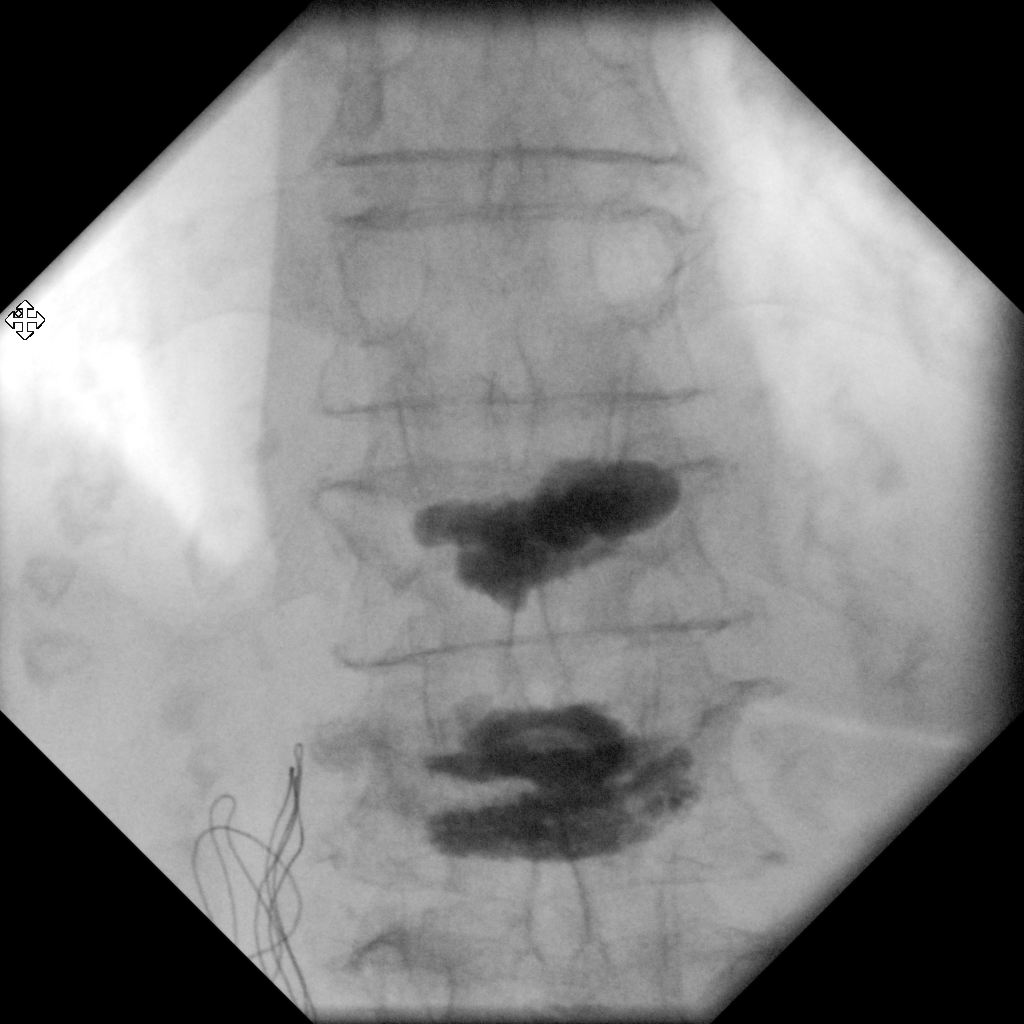

[1 of 1 positions shown; findings below may reference images not displayed]

FINDINGS: Prior vertebroplasties. Methylmethacrylate noted within the treated
vertebral bodies. Anatomic alignment.
IMPRESSION: Prior vertebroplasties.

## 2020-08-16 ENCOUNTER — Other Ambulatory Visit: Payer: Medicare Other | Admitting: Student

## 2020-08-16 ENCOUNTER — Telehealth: Payer: Self-pay | Admitting: Student

## 2020-08-16 ENCOUNTER — Other Ambulatory Visit: Payer: Self-pay

## 2020-08-16 NOTE — Telephone Encounter (Signed)
Palliative NP spoke with daughter [name] to confirm afternoon. Daughter asks to reschedule due to conflict in schedule. Appointment rescheduled for Monday 8/22 at 230pm.

## 2020-08-22 ENCOUNTER — Other Ambulatory Visit: Payer: Medicare Other | Admitting: Student

## 2020-08-22 ENCOUNTER — Other Ambulatory Visit: Payer: Self-pay

## 2020-08-22 DIAGNOSIS — Z515 Encounter for palliative care: Secondary | ICD-10-CM | POA: Diagnosis not present

## 2020-08-22 DIAGNOSIS — R0602 Shortness of breath: Secondary | ICD-10-CM | POA: Diagnosis not present

## 2020-08-22 DIAGNOSIS — R630 Anorexia: Secondary | ICD-10-CM

## 2020-08-22 DIAGNOSIS — F0390 Unspecified dementia without behavioral disturbance: Secondary | ICD-10-CM | POA: Diagnosis not present

## 2020-08-22 DIAGNOSIS — M80029A Age-related osteoporosis with current pathological fracture, unspecified humerus, initial encounter for fracture: Secondary | ICD-10-CM | POA: Diagnosis not present

## 2020-08-22 NOTE — Progress Notes (Signed)
Leslie Johnston Consult Note Telephone: [phone]  Fax: [phone]    Date of encounter: 08/22/20 2:40 PM PATIENT NAME: Leslie Johnston [address] [place] [address]   [phone] (home)  DOB: 08-18-1925 MRN: [id number] PRIMARY CARE PROVIDER:    System, Provider Not In,  No address on file None  REFERRING PROVIDER:   No referring provider defined for this encounter. N/A  RESPONSIBLE PARTY:    Contact Information     Name Relation Home Work Mobile   Leslie Johnston Daughter   [phone]   Leslie Johnston Daughter   [phone]        I met face to face with patient and family in the home. Palliative Care was asked to follow this patient by consultation request of PCP to address advance care planning and complex medical decision making. This is a follow up visit.                                   ASSESSMENT AND PLAN / RECOMMENDATIONS:   Advance Care Planning/Goals of Care: Goals include to maximize quality of life and symptom management.   CODE STATUS: DNR  Symptom Management/Plan:  Dementia-continue to reorient/redirect as needed. Monitor for falls/safety. Family to continue supportive care.   Osteoporosis-continue daily vitamin D and calcium supplement. Weight bearing exercises as tolerated.   Shortness of breath-patient wearing oxygen at night at 2 lpm. Discussed wearing PRN during the day to keeps sats 92% or greater at 2 lpm PRN.  Appetite-continue mirtazapine as directed; offer foods patient enjoys.   Follow up Palliative Care Visit: Palliative care will continue to follow for complex medical decision making, advance care planning, and clarification of goals. Return in 6-8 weeks or prn.  I spent 60 minutes providing this consultation. More than 50% of the time in this consultation was spent in counseling and care coordination.    PPS: 50%  HOSPICE ELIGIBILITY/DIAGNOSIS: TBD  Chief Complaint:  Palliative Medicine follow up visit.   HISTORY OF PRESENT ILLNESS:  Leslie Johnston is a 87 y.o. year old female  with dementia, osteoporosis without current pathological fracture, chronic lower back pain, hypertension, CKD 3, atrial fibrillation, allergic rhinitis, GERD, overactive bladder. Hx of compression fracture to thoracic spine, hx of pathological fracture of humerus.  Patient reports doing well. She states she is having little back pain, this has mostly improved. She does report occasional shortness of breath; wears oxygen at night at 2 lpm. Daughter reports patient having one episode in past 3 weeks during the day, where patient became short of breath. She is usually able to do deep breathing to help regulate breathing, but required oxygen being applied for short time to return to baseline. She denies any recent falls or injury. She has family with her around the clock. She states she is able to dress herself, perform adl's. She endorses a good appetite; her daughter Leslie Johnston states patient will often state she is not hungry. Her weight varies between 123 to 130 pounds. Patient was seen by Endocrinology on 08/02/20. She had DEXA scan on 08/10/20.  Patient received resting in recliner. She has pleasant affect. She does repeat herself throughout visit. Daughter Leslie Johnston states she has been doing this more. A 10-point review of systems is negative, except for the pertinent positives and negatives detailed in the HPI.   History obtained from review of EMR, discussion with primary team, and  interview with family, facility staff/caregiver and/or [name].  I reviewed available labs, medications, imaging, studies and related documents from the EMR.  Records reviewed and summarized above.    Physical Exam: Pulse 78, resp 16, blood pressure 132/78, sats 94% on room air Constitutional: NAD General: frail appearing, thin EYES: anicteric sclera, lids intact, no discharge  ENMT: intact hearing, oral  mucous membranes moist, dentition intact CV: S1S2, RRR, no LE edema Pulmonary: LCTA, no increased work of breathing, no cough Abdomen:normo-active BS + 4 quadrants, soft and non tender GU: deferred MSK: sarcopenia, moves all extremities, ambulatory Skin: warm and dry, no rashes or wounds on visible skin Neuro:  no generalized weakness, A & O x 2, forgetful Psych: non-anxious affect, pleasant mood/affect Hem/lymph/immuno: no widespread bruising   Thank you for the opportunity to participate in the care of Leslie Johnston.  The palliative care team will continue to follow. Please call our office at [phone] if we can be of additional assistance.   Ezekiel Slocumb, NP   COVID-19 PATIENT SCREENING TOOL Asked and negative response unless otherwise noted:   Have you had symptoms of covid, tested positive or been in contact with someone with symptoms/positive test in the past 5-10 days? No

## 2020-08-26 DIAGNOSIS — M810 Age-related osteoporosis without current pathological fracture: Secondary | ICD-10-CM | POA: Diagnosis not present

## 2020-09-12 ENCOUNTER — Ambulatory Visit (INDEPENDENT_AMBULATORY_CARE_PROVIDER_SITE_OTHER): Payer: Medicare Other | Admitting: Family Medicine

## 2020-09-12 ENCOUNTER — Other Ambulatory Visit: Payer: Self-pay

## 2020-09-12 ENCOUNTER — Encounter: Payer: Self-pay | Admitting: Family Medicine

## 2020-09-12 VITALS — BP 142/78 | HR 80 | Wt 129.0 lb

## 2020-09-12 DIAGNOSIS — M545 Low back pain, unspecified: Secondary | ICD-10-CM | POA: Diagnosis not present

## 2020-09-12 DIAGNOSIS — I700 Atherosclerosis of aorta: Secondary | ICD-10-CM | POA: Diagnosis not present

## 2020-09-12 DIAGNOSIS — M80029A Age-related osteoporosis with current pathological fracture, unspecified humerus, initial encounter for fracture: Secondary | ICD-10-CM | POA: Diagnosis not present

## 2020-09-12 DIAGNOSIS — G8929 Other chronic pain: Secondary | ICD-10-CM

## 2020-09-12 DIAGNOSIS — Z9889 Other specified postprocedural states: Secondary | ICD-10-CM

## 2020-09-12 NOTE — Patient Instructions (Addendum)
Thank you for coming to the office today.  Oxygen test today for during the day oxygen.  If successful and no longer need, keep on hand for few weeks and can avoid wearing at night as well.  Check oxygen periodically overnight and/or daytime, let me know if less than 88%.  If need assistance on discontinuation order / for oxygen supply pick up - let me know.  Please schedule a Follow-up Appointment to: Return in about 8 months (around 05/12/2021) for 8 month follow-up Yearly Medicare Checkup, fasting lab AFTER.  If you have any other questions or concerns, please feel free to call the office or send a message through [facility]. You may also schedule an earlier appointment if necessary.  Additionally, you may be receiving a survey about your experience at our office within a few days to 1 week by e-mail or mail. We value your feedback.  Nobie Putnam, DO [place]

## 2020-09-12 NOTE — Progress Notes (Signed)
Subjective:    Patient ID: Leslie Johnston, female    DOB: January 31, 1925, 87 y.o.   MRN: [id number]  Leslie Johnston is a 87 y.o. female presenting on 09/12/2020 for Back Pain   HPI  Osteoporosis / Wedge Compression Frx Hx Endocrinology Dr Gabriel Carina Completed infusion 08/26/20  She followed with PM&R Dr Alba Destine with injection therapy. No further treatment options at this time. On Tylenol regular dosing PRN with some relief. Some pain with transition with position from seated to laying and from laying to seated/getting up  Palliative Care Nurse She was discharged from hospital with supplemental oxygen They recommended trying oxygen as needed during day and at night  Question if should be on Oxygen long term  Atherosclerosis Aorta Identified on prior imaging CT 05/2020 Not on statin therapy. On blood thinner anticoagulant currently.   Depression screen [facility] 2/9 09/12/2020 04/15/2019 04/15/2019  Decreased Interest 1 0 0  Down, Depressed, Hopeless 1 0 0  PHQ - 2 Score 2 0 0  Altered sleeping 0 0 -  Tired, decreased energy 1 0 -  Change in appetite 1 0 -  Feeling bad or failure about yourself  0 0 -  Trouble concentrating 3 0 -  Moving slowly or fidgety/restless 0 0 -  Suicidal thoughts 0 0 -  PHQ-9 Score 7 0 -  Difficult doing work/chores Extremely dIfficult Not difficult at all -    Social History   Tobacco Use   Smoking status: Never   Smokeless tobacco: Never  Vaping Use   Vaping Use: Never used  Substance Use Topics   Alcohol use: No    Alcohol/week: 0.0 standard drinks   Drug use: No    Review of Systems Per HPI unless specifically indicated above     Objective:     BP (!) 142/78 (BP Location: Left Arm, Patient Position: Sitting, Cuff Size: Normal)   Pulse 80   Wt 129 lb (58.5 kg)   SpO2 95%   BMI 20.82 kg/m   Wt Readings from Last 3 Encounters:  09/12/20 129 lb (58.5 kg)  06/08/20 126 lb (57.2 kg)  05/17/20 118 lb 13.3 oz (53.9 kg)    Physical  Exam Vitals and nursing note reviewed.  Constitutional:      General: She is not in acute distress.    Appearance: Normal appearance. She is well-developed. She is not diaphoretic.     Comments: Well-appearing, comfortable, cooperative  HENT:     Head: Normocephalic and atraumatic.  Eyes:     General:        Right eye: No discharge.        Left eye: No discharge.     Conjunctiva/sclera: Conjunctivae normal.  Cardiovascular:     Rate and Rhythm: Normal rate and regular rhythm.     Pulses: Normal pulses.     Heart sounds: No murmur heard. Pulmonary:     Effort: Pulmonary effort is normal. No respiratory distress.     Breath sounds: Normal breath sounds. No stridor. No wheezing, rhonchi or rales.  Skin:    General: Skin is warm and dry.     Findings: No erythema or rash.  Neurological:     Mental Status: She is alert and oriented to person, place, and time.  Psychiatric:        Mood and Affect: Mood normal.        Behavior: Behavior normal.        Thought Content: Thought content normal.  Comments: Well groomed, good eye contact, normal speech and thoughts   I have personally reviewed the radiology report from 05/2020 CT.   CLINICAL DATA:  Upper abdominal pain that radiates to the back since Friday   EXAM: CT ABDOMEN AND PELVIS WITHOUT CONTRAST   TECHNIQUE: Multidetector CT imaging of the abdomen and pelvis was performed following the standard protocol without IV contrast.   COMPARISON:  February 28, 2020   FINDINGS: Lower chest: Scarring versus atelectasis in the lung bases. Moderate hiatal hernia.   Hepatobiliary: Unremarkable noncontrast appearance of the hepatic parenchyma. Calcified gallstones without findings of acute cholecystitis. No biliary ductal dilation.   Pancreas: Within normal limits.   Spleen: Peripheral calcifications within a 1.2 cm hypodense splenic lesion, likely sequela prior infection or trauma.   Adrenals/Urinary Tract: Bilateral  adrenal glands are unremarkable. Bilateral hypodense renal lesions measuring up to 3.1 cm on the right and 2.3 cm on the left the incompletely evaluated on this single-phase study but favored represent cysts. No hydronephrosis. No nephrolithiasis. Urinary bladder is grossly unremarkable for degree of distension.   Stomach/Bowel: Moderate hiatal hernia otherwise the stomach is grossly unremarkable for degree of distension. Normal positioning of the duodenum/ligament of Treitz. Duodenal diverticulum. No pathologic dilation of small bowel. The appendix is not definitely visualized however there is no pericecal inflammation. Colonic diverticulosis without findings of acute diverticulitis.   Vascular/Lymphatic: Aortic and branch vessel atherosclerosis. No abdominal aortic aneurysm. No pathologically enlarged abdominal or pelvic lymph nodes.   Reproductive: Uterine leiomyoma.  No suspicious adnexal lesions.   Other: No abdominopelvic ascites.  No pneumoperitoneum.   Musculoskeletal: Diffuse demineralization of bone. No rest of lytic or blastic lesions of bone. Multilevel degenerative changes spine. T12 and L1 kyphoplasty. No acute osseous abnormality.   IMPRESSION: 1. No acute findings in the abdomen or pelvis. 2. Cholelithiasis without findings of acute cholecystitis. 3. Moderate hiatal hernia. 4. Colonic diverticulosis without findings of acute diverticulitis. 5. Uterine leiomyoma. 6. Chronic appearing multilevel vertebral body height loss of the visualized lower thoracic and lumbar spine with prior T12 and L1 kyphoplasties.   Aortic Atherosclerosis (ICD10-I70.0).     Electronically Signed   By: Dahlia Bailiff MD   On: 05/15/2020 19:09   Results for orders placed or performed during the hospital encounter of 05/15/20  SARS CORONAVIRUS 2 (TAT 6-24 HRS) Nasopharyngeal Nasopharyngeal Swab   Specimen: Nasopharyngeal Swab  Result Value Ref Range   SARS Coronavirus 2 NEGATIVE  NEGATIVE  CBC  Result Value Ref Range   WBC 8.4 4.0 - 10.5 K/uL   RBC 4.56 3.87 - 5.11 MIL/uL   Hemoglobin 12.6 12.0 - 15.0 g/dL   HCT 40.2 36.0 - 46.0 %   MCV 88.2 80.0 - 100.0 fL   MCH 27.6 26.0 - 34.0 pg   MCHC 31.3 30.0 - 36.0 g/dL   RDW 13.6 11.5 - 15.5 %   Platelets 222 150 - 400 K/uL   nRBC 0.0 0.0 - 0.2 %  Urinalysis, Complete w Microscopic  Result Value Ref Range   Color, Urine STRAW (A) YELLOW   APPearance CLEAR (A) CLEAR   Specific Gravity, Urine 1.004 (L) 1.005 - 1.030   pH 8.0 5.0 - 8.0   Glucose, UA NEGATIVE NEGATIVE mg/dL   Hgb urine dipstick NEGATIVE NEGATIVE   Bilirubin Urine NEGATIVE NEGATIVE   Ketones, ur NEGATIVE NEGATIVE mg/dL   Protein, ur NEGATIVE NEGATIVE mg/dL   Nitrite NEGATIVE NEGATIVE   Leukocytes,Ua NEGATIVE NEGATIVE   RBC / HPF 0-5  0 - 5 RBC/hpf   WBC, UA 0-5 0 - 5 WBC/hpf   Bacteria, UA NONE SEEN NONE SEEN   Squamous Epithelial / LPF 0-5 0 - 5  Comprehensive metabolic panel  Result Value Ref Range   Sodium 138 135 - 145 mmol/L   Potassium 4.3 3.5 - 5.1 mmol/L   Chloride 105 98 - 111 mmol/L   CO2 26 22 - 32 mmol/L   Glucose, Bld 116 (H) 70 - 99 mg/dL   BUN 15 8 - 23 mg/dL   Creatinine, Ser 0.75 0.44 - 1.00 mg/dL   Calcium 9.3 8.9 - 10.3 mg/dL   Total Protein 6.5 6.5 - 8.1 g/dL   Albumin 3.5 3.5 - 5.0 g/dL   AST 24 15 - 41 U/L   ALT 12 0 - 44 U/L   Alkaline Phosphatase 72 38 - 126 U/L   Total Bilirubin 0.6 0.3 - 1.2 mg/dL   GFR, Estimated >60 >60 mL/min   Anion gap 7 5 - 15  Lipase, blood  Result Value Ref Range   Lipase 33 11 - 51 U/L  Magnesium  Result Value Ref Range   Magnesium 1.9 1.7 - 2.4 mg/dL  Lipase, blood  Result Value Ref Range   Lipase 54 (H) 11 - 51 U/L  Magnesium  Result Value Ref Range   Magnesium 2.3 1.7 - 2.4 mg/dL  Comprehensive metabolic panel  Result Value Ref Range   Sodium 140 135 - 145 mmol/L   Potassium 4.2 3.5 - 5.1 mmol/L   Chloride 107 98 - 111 mmol/L   CO2 28 22 - 32 mmol/L   Glucose, Bld 104  (H) 70 - 99 mg/dL   BUN 14 8 - 23 mg/dL   Creatinine, Ser 0.71 0.44 - 1.00 mg/dL   Calcium 9.3 8.9 - 10.3 mg/dL   Total Protein 6.3 (L) 6.5 - 8.1 g/dL   Albumin 3.3 (L) 3.5 - 5.0 g/dL   AST 18 15 - 41 U/L   ALT 11 0 - 44 U/L   Alkaline Phosphatase 69 38 - 126 U/L   Total Bilirubin 0.7 0.3 - 1.2 mg/dL   GFR, Estimated >60 >60 mL/min   Anion gap 5 5 - 15  CBC  Result Value Ref Range   WBC 6.7 4.0 - 10.5 K/uL   RBC 4.38 3.87 - 5.11 MIL/uL   Hemoglobin 12.2 12.0 - 15.0 g/dL   HCT 39.0 36.0 - 46.0 %   MCV 89.0 80.0 - 100.0 fL   MCH 27.9 26.0 - 34.0 pg   MCHC 31.3 30.0 - 36.0 g/dL   RDW 13.4 11.5 - 15.5 %   Platelets 209 150 - 400 K/uL   nRBC 0.0 0.0 - 0.2 %  Lactate dehydrogenase, isoenzymes  Result Value Ref Range   LDH '1 26 17 '$ - 32 %   LDH 2 35 25 - 40 %   LDH '3 20 17 '$ - 27 %   LDH '4 11 5 '$ - 13 %   LDH '5 8 4 '$ - 20 %   LDH Isoenzymes, Total 171 119 - 226 IU/L  Troponin I (High Sensitivity)  Result Value Ref Range   Troponin I (High Sensitivity) 5 <18 ng/L  Troponin I (High Sensitivity)  Result Value Ref Range   Troponin I (High Sensitivity) 6 <18 ng/L      Assessment & Plan:   Problem List Items Addressed This Visit     Postmenopausal osteoporosis with pathological fracture of humerus - Primary  Chronic bilateral low back pain without sciatica   Atherosclerosis of aorta ([place])   Other Visit Diagnoses     History of kyphoplasty           6 min walk test performed She did not qualify for oxygen supplemental at this time. >95% with ambulation At rest room air 95% SpO2 Ambulation 6 min walk remained >95% SpO2 Advised can DC home supplemental oxygen when ready notify us if need DC order or company info.  Osteoporosis Working with Endocrinology, s/p infusion therapy already Follow current plan no further treatment.  Chronic back pain Likely related to deconditioning of muscles with transitions of position. Otherwise functioning well. Continue current therapy Has  done spinal injections already Continue TYlenol PRN as advised  Atherosclerosis Aorta Identified on CT imaging Continue anticoagulant Not on statin.  No orders of the defined types were placed in this encounter.     Follow up plan: Return in about 8 months (around 05/12/2021) for 8 month follow-up Yearly Medicare Checkup, fasting lab AFTER.   Nobie Putnam, [facility] 09/12/2020, 2:42 PM

## 2020-09-20 ENCOUNTER — Ambulatory Visit: Payer: Medicare Other

## 2020-09-26 ENCOUNTER — Other Ambulatory Visit: Payer: Medicare Other | Admitting: Student

## 2020-09-26 ENCOUNTER — Other Ambulatory Visit: Payer: Self-pay

## 2020-09-26 DIAGNOSIS — F0390 Unspecified dementia without behavioral disturbance: Secondary | ICD-10-CM | POA: Diagnosis not present

## 2020-09-26 DIAGNOSIS — R52 Pain, unspecified: Secondary | ICD-10-CM

## 2020-09-26 DIAGNOSIS — Z515 Encounter for palliative care: Secondary | ICD-10-CM | POA: Diagnosis not present

## 2020-09-26 DIAGNOSIS — R0602 Shortness of breath: Secondary | ICD-10-CM | POA: Diagnosis not present

## 2020-09-26 NOTE — Progress Notes (Signed)
[facility] Consult Note Telephone: [phone]  Fax: [phone]    Date of encounter: 09/26/20 11:31 AM PATIENT NAME: Leslie Johnston [address] [place] [address]   [phone] (home)  DOB: Jul 08, 1925 MRN: [id number] PRIMARY CARE PROVIDER:    Dr. Parks Ranger REFERRING PROVIDER:     RESPONSIBLE PARTY:    Contact Information     Name Relation Home Work Mobile   Sykes,Kay Daughter   [phone]   [name] Daughter   [phone]        I met face to face with patient and family in the home. Palliative Care was asked to follow this patient by consultation request of  Dr. Parks Ranger to address advance care planning and complex medical decision making. This is a follow up visit.                                   ASSESSMENT AND PLAN / RECOMMENDATIONS:   Advance Care Planning/Goals of Care: Goals include to maximize quality of life and symptom management.  CODE STATUS: DNR  Symptom Management/Plan:  Dementia-continue to reorient/redirect as needed. Monitor for falls/safety. Family to continue supportive care.    Osteoporosis-continue daily vitamin D and calcium supplement. Weight bearing exercises as tolerated.   Back pain-continue acetaminophen 650mg  BID.    Shortness of breath-patient wearing oxygen at night at 2 lpm. Family is attempting to wean down. Recommend continue to wear at 2 lpm PRN to keep sats 92% or greater.   Follow up Palliative Care Visit: Palliative care will continue to follow for complex medical decision making, advance care planning, and clarification of goals. Return 6-8 weeks or prn.  I spent 40 minutes providing this consultation. More than 50% of the time in this consultation was spent in counseling and care coordination.   PPS: 40%  HOSPICE ELIGIBILITY/DIAGNOSIS: TBD  Chief Complaint: Palliative Medicine follow up visit.   HISTORY OF PRESENT ILLNESS:  Leslie Johnston is a  87 y.o. year old female  with dementia, osteoporosis without current pathological fracture, chronic lower back pain, hypertension, CKD 3, atrial fibrillation, allergic rhinitis, GERD, overactive bladder. Hx of compression fracture to thoracic spine, hx of pathological fracture of humerus.   Patient reports doing well.  She does endorse occasional back pain.  She is taking acetaminophen as needed, which provides relief.  She does not endorse occasional shortness of breath. Daughter Leslie Johnston states that patient has experienced some shortness of breath at night. She denies any nausea or constipation. She does report post nasal drip, runny nose; takes loratadine with effectiveness. She states she is sleeping well at night. No recent falls or injuries reported. Patient ambulates with a cane. No recent infections, no ED visits or hospitalizations. A 10-point review of systems is negative, except for the pertinent positives and negatives detailed in the HPI.   Patient received in her living room. She has pleasant affect. She engages in conversation.  Patient does repeat herself throughout visit.  Daughter Leslie Johnston states patient does sometimes fixate on certain situations. NP ambulated patient about her home; her sats went from 95-96% at rest, down to 91% with exertion. After deep breathing, her sats rose back up to 95%.    History obtained from review of EMR, discussion with primary team, and interview with family, facility staff/caregiver and/or Leslie Johnston.  I reviewed available labs, medications, imaging, studies and related documents from the EMR.  Records reviewed and summarized above.    Physical Exam:  Pulse 80, resp 20, b/p 130/60, sats 95%-96%, drops to 91% on exertion. Constitutional: NAD General: frail appearing, thin EYES: anicteric sclera, lids intact, no discharge  ENMT: intact hearing, oral mucous membranes moist CV: S1S2, RRR, no LE edema Pulmonary: LCTA, no increased work of breathing, no  cough, room air Abdomen: normo-active BS + 4 quadrants, soft and non tender GU: deferred MSK: moves all extremities, ambulatory with cane Skin: warm and dry, no rashes or wounds on visible skin Neuro: generalized weakness, A & O x 2, forgetful Psych: non-anxious affect, pleasant Hem/lymph/immuno: no widespread bruising   Thank you for the opportunity to participate in the care of Ms. Iverson.  The palliative care team will continue to follow. Please call our office at [phone] if we can be of additional assistance.   Ezekiel Slocumb, NP   COVID-19 PATIENT SCREENING TOOL Asked and negative response unless otherwise noted:   Have you had symptoms of covid, tested positive or been in contact with someone with symptoms/positive test in the past 5-10 days? No

## 2020-09-28 ENCOUNTER — Encounter: Payer: Self-pay | Admitting: Family Medicine

## 2020-10-11 ENCOUNTER — Other Ambulatory Visit: Payer: Self-pay | Admitting: Family Medicine

## 2020-10-11 DIAGNOSIS — F341 Dysthymic disorder: Secondary | ICD-10-CM

## 2020-10-11 DIAGNOSIS — F02818 Dementia in other diseases classified elsewhere, unspecified severity, with other behavioral disturbance: Secondary | ICD-10-CM

## 2020-10-11 NOTE — Telephone Encounter (Signed)
Requested medications are due for refill today.  yes  Requested medications are on the active medications list.  yes  Last refill. 04/19/2020  Future visit scheduled.   no  Notes to clinic.  No pcp listed.

## 2020-10-12 NOTE — Telephone Encounter (Signed)
Is she your patient?

## 2020-10-28 ENCOUNTER — Ambulatory Visit: Payer: Medicare Other | Admitting: Cardiovascular Disease

## 2020-11-18 ENCOUNTER — Telehealth: Payer: Self-pay | Admitting: Student

## 2020-11-18 NOTE — Telephone Encounter (Signed)
Np returned call to patient's daughter [name]. She is requesting SW assistance regarding if patient has Medicaid and she also states patient received EBT for a period, but this has stopped. Request sent to Palliative SW. Palliative f/u visit scheduled for December 5th at 330pm.

## 2020-11-23 ENCOUNTER — Encounter: Payer: Self-pay | Admitting: Cardiovascular Disease

## 2020-11-23 ENCOUNTER — Ambulatory Visit (INDEPENDENT_AMBULATORY_CARE_PROVIDER_SITE_OTHER): Payer: Medicare Other | Admitting: Cardiovascular Disease

## 2020-11-23 ENCOUNTER — Other Ambulatory Visit: Payer: Self-pay

## 2020-11-23 VITALS — BP 140/92 | HR 101 | Ht 63.0 in | Wt 122.1 lb

## 2020-11-23 DIAGNOSIS — I129 Hypertensive chronic kidney disease with stage 1 through stage 4 chronic kidney disease, or unspecified chronic kidney disease: Secondary | ICD-10-CM | POA: Diagnosis not present

## 2020-11-23 DIAGNOSIS — I10 Essential (primary) hypertension: Secondary | ICD-10-CM | POA: Diagnosis not present

## 2020-11-23 DIAGNOSIS — I480 Paroxysmal atrial fibrillation: Secondary | ICD-10-CM | POA: Diagnosis not present

## 2020-11-23 DIAGNOSIS — N183 Chronic kidney disease, stage 3 unspecified: Secondary | ICD-10-CM

## 2020-11-23 DIAGNOSIS — I700 Atherosclerosis of aorta: Secondary | ICD-10-CM | POA: Diagnosis not present

## 2020-11-23 MED ORDER — ALBUTEROL SULFATE HFA 108 (90 BASE) MCG/ACT IN AERS: 1.0000 | INHALATION_SPRAY | Freq: Four times a day (QID) | RESPIRATORY_TRACT | 0 refills | Status: DC | PRN

## 2020-11-23 MED ORDER — DILTIAZEM HCL ER COATED BEADS 180 MG PO CP24: 180.0000 mg | ORAL_CAPSULE | Freq: Every day | ORAL | 3 refills | Status: DC

## 2020-11-23 MED ORDER — ALBUTEROL SULFATE HFA 108 (90 BASE) MCG/ACT IN AERS: 1.0000 | INHALATION_SPRAY | Freq: Four times a day (QID) | RESPIRATORY_TRACT | 1 refills | Status: DC | PRN

## 2020-11-23 NOTE — Patient Instructions (Addendum)
Medication Instructions:  Please INCREASE Diltiazem ER up to 180 mg daily  Albuterol inhaler PRN  If you need a refill on your cardiac medications before your next appointment, please call your pharmacy.   Lab work: No new labs needed  Testing/Procedures: No new testing needed  Follow-Up: At [facility], you and your health needs are our priority.  As part of our continuing mission to provide you with exceptional heart care, we have created designated Provider Care Teams.  These Care Teams include your primary Cardiologist (physician) and Advanced Practice Providers (APPs -  Physician Assistants and Nurse Practitioners) who all work together to provide you with the care you need, when you need it.  You will need a follow up appointment in 12 months  Providers on your designated Care Team:   Murray Hodgkins, NP Christell Faith, PA-C Cadence Kathlen Mody, [place]  COVID-19 Vaccine Information can be found at: [email or web address].uk For questions related to vaccine distribution or appointments, please email [email or web address] or call [phone].

## 2020-11-23 NOTE — Progress Notes (Signed)
Cardiology Office Note  Date:  11/23/2020   ID:  MALAISHA Johnston, DOB 01/17/1925, MRN [id number]  PCP:  Olin Hauser, DO   Chief Complaint  Patient presents with   6 month follow up     Patient c/o shortness of breath with little to no exertion. Medications reviewed by the patient's daughter( DEE) verbally.    HPI:  Leslie Johnston is a 87 y.o. female with a hx of  HTN, overactive bladder, osteoporosis,  dementia,  Afib  who presents for follow-up for afib.   Last seen in clinic April 2022 Presents today with family Hard of hearing, she reports that she is doing well with no complaints Family reports that she is sedentary, no regular exercise program She reports her legs are strong enough Denies any falls  On discussion of medications of blood pressure, BP 130-140/70- 85 at home Pulse 60 to 110,   avg 90  Denies any chest pain or shortness of breath concerning for angina Elevated heart rate on today's visit, appears relaxed, not anxious  wheezy today, unsure if she is getting sick  EKG personally reviewed by myself on todays visit Normal sinus rhythm/sinus tachycardia rate 101 bpm poor R wave progression to the anterior precordial leads, left axis deviation, unable to exclude old inferior MI  Other past medical history reviewed  admitted 2/9-2/11 with chest pain and abdominal pain.   found to be in  onset Afib with rates up into the 170s.   Labs were unremarkable.  Patient converted to SR with IV diltiazem.   Echo showed LVEF 60-65%, no WMA, G1DD, normal RV function.   Diltiazem was consolidated to 120mg  daily.   started on Eliquis 2.5mg  BID (age and weight)    PMH:   has a past medical history of A-fib ([place]), Allergic rhinitis, Allergy, Chronic low back pain, GERD (gastroesophageal reflux disease), and Hypertension.  PSH:    Past Surgical History:  Procedure Laterality Date   EYE SURGERY     cataract extraction- Right   KYPHOPLASTY N/A  11/26/2017   Procedure: [id number],L9;  Surgeon: Hessie Knows, MD;  Location: [facility] ORS;  Service: Orthopedics;  Laterality: N/A;    Current Outpatient Medications  Medication Sig Dispense Refill   acetaminophen (TYLENOL) 650 MG CR tablet Take 650 mg by mouth every 8 (eight) hours as needed for pain.     apixaban (ELIQUIS) 2.5 MG TABS tablet Take 1 tablet (2.5 mg total) by mouth 2 (two) times daily. 180 tablet 3   diltiazem (CARDIZEM CD) 120 MG 24 hr capsule Take 1 capsule (120 mg total) by mouth daily. 90 capsule 3   lisinopril (ZESTRIL) 20 MG tablet Take 1 tablet (20 mg total) by mouth daily. 90 tablet 3   loratadine (CLARITIN) 10 MG tablet Take 10 mg by mouth daily.     Multiple Vitamin (MULTIVITAMIN WITH MINERALS) TABS tablet Take 1 tablet by mouth daily with lunch.      Multiple Vitamins-Minerals (PRESERVISION AREDS 2+MULTI VIT PO) Take by mouth.     pantoprazole (PROTONIX) 40 MG tablet Take 1 tablet (40 mg total) by mouth daily before breakfast. 90 tablet 3   alum & mag hydroxide-simeth (MAALOX/MYLANTA) 200-200-20 MG/5ML suspension Take by mouth every 6 (six) hours as needed for indigestion or heartburn. (Patient not taking: Reported on 11/23/2020)     mirtazapine (REMERON) 7.5 MG tablet TAKE 1 TABLET(7.5 MG) BY MOUTH AT BEDTIME (Patient not taking: Reported on 11/23/2020) 90 tablet 1   oxyCODONE-acetaminophen (PERCOCET/ROXICET)  5-325 MG tablet Take 1 tablet by mouth every 6 (six) hours as needed for moderate pain. (Patient not taking: Reported on 11/23/2020) 30 tablet 0   No current facility-administered medications for this visit.     Allergies:   Amlodipine, Losartan, Penicillins, and Tramadol   Social History:  The patient  reports that she has never smoked. She has never used smokeless tobacco. She reports that she does not drink alcohol and does not use drugs.   Family History:   family history includes Arthritis in her maternal grandmother; Asthma in her maternal  grandfather; Bladder Cancer in her daughter; Breast cancer in her daughter; Leukemia in her maternal grandfather; Pancreatic cancer in her sister.    Review of Systems: Review of Systems  Constitutional: Negative.   HENT: Negative.    Respiratory: Negative.    Cardiovascular: Negative.   Gastrointestinal: Negative.   Musculoskeletal: Negative.   Neurological: Negative.   Psychiatric/Behavioral: Negative.    All other systems reviewed and are negative.  PHYSICAL EXAM: VS:  BP (!) 140/92 (BP Location: Left Arm, Patient Position: Sitting, Cuff Size: Normal)   Pulse (!) 101   Ht 5\' 3"  (1.6 m)   Wt 122 lb 2 oz (55.4 kg)   SpO2 92%   BMI 21.63 kg/m  , BMI Body mass index is 21.63 kg/m. Constitutional:  oriented to person, place, and time. No distress.  HENT:  Head: Grossly normal Eyes:  no discharge. No scleral icterus.  Neck: No JVD, no carotid bruits  Cardiovascular: Regular rate and rhythm, no murmurs appreciated Pulmonary/Chest: Clear to auscultation bilaterally,  Wheezing appreciated anteriorly and posteriorly Abdominal: Soft.  no distension.  no tenderness.  Musculoskeletal: Normal range of motion Neurological:  normal muscle tone. Coordination normal. No atrophy Skin: Skin warm and dry Psychiatric: normal affect, pleasant   Recent Labs: 02/10/2020: TSH 0.797 05/16/2020: ALT 11; BUN 14; Creatinine, Ser 0.71; Hemoglobin 12.2; Magnesium 2.3; Platelets 209; Potassium 4.2; Sodium 140    Lipid Panel Lab Results  Component Value Date   CHOL 142 02/12/2020   HDL 45 02/12/2020   LDLCALC 81 02/12/2020   TRIG 79 02/12/2020      Wt Readings from Last 3 Encounters:  11/23/20 122 lb 2 oz (55.4 kg)  09/12/20 129 lb (58.5 kg)  06/08/20 126 lb (57.2 kg)      ASSESSMENT AND PLAN:  Problem List Items Addressed This Visit       Cardiology Problems   Atherosclerosis of aorta (HCC)   Benign hypertension with CKD (chronic kidney disease) stage III   Other Visit Diagnoses      Paroxysmal A-fib ([place])    -  Primary   Essential hypertension       Stage 3 chronic kidney disease, unspecified whether stage 3a or 3b CKD (HCC)         Atrial fib, paroxysmal Maintaining normal sinus rhythm,  with Eliquis 2.5 twice daily Given elevated heart rate and mildly elevated blood pressure, recommend she increase diltiazem ER up to 180 daily  Essential hypertension Increased diltiazem as above, other medications we will keep the same including lisinopril  Abdominal pain Stable  Wheezing Unclear if she is getting sick, mild to moderate wheezing on exam Albuterol inhaler prescribed in case symptoms get worse Also recommended she contact primary care for symptoms concerning for bronchitis   Total encounter time more than 25 minutes  Greater than 50% was spent in counseling and coordination of care with the patient    Signed,  Esmond Plants, M.D., Ph.D. [place], [place]

## 2020-12-05 ENCOUNTER — Other Ambulatory Visit: Payer: Self-pay

## 2020-12-05 ENCOUNTER — Other Ambulatory Visit: Payer: Medicare Other | Admitting: Student

## 2020-12-05 DIAGNOSIS — Z515 Encounter for palliative care: Secondary | ICD-10-CM | POA: Diagnosis not present

## 2020-12-05 DIAGNOSIS — R630 Anorexia: Secondary | ICD-10-CM | POA: Diagnosis not present

## 2020-12-05 DIAGNOSIS — G8929 Other chronic pain: Secondary | ICD-10-CM

## 2020-12-05 DIAGNOSIS — J302 Other seasonal allergic rhinitis: Secondary | ICD-10-CM

## 2020-12-05 DIAGNOSIS — M545 Low back pain, unspecified: Secondary | ICD-10-CM

## 2020-12-05 DIAGNOSIS — F0390 Unspecified dementia without behavioral disturbance: Secondary | ICD-10-CM

## 2020-12-05 NOTE — Progress Notes (Signed)
[facility] Pall[facility] Consult Note Telephone: [phone]  Fax: [phone]    Date of encounter: 12/05/20 3:43 PM PATIENT NAME: Leslie Johnston [address] [place] [address]   [phone] (home)  DOB: 11/12/1925 MRN: [id number] PRIMARY CARE PROVIDER:    Olin Hauser, DO,  [place] [address] [phone]  REFERRING PROVIDER:   Olin Hauser, DO [address] [place],  [place] [address] [phone]  RESPONSIBLE PARTY:    Contact Information     Name Relation Home Work Mobile   [place] Daughter   [phone]   [name] Daughter   [phone]        I met face to face with patient and family in the home. Palliative Care was asked to follow this patient by consultation request of  Nobie Putnam * to address advance care planning and complex medical decision making. This is a follow up visit.                                   ASSESSMENT AND PLAN / RECOMMENDATIONS:   Advance Care Planning/Goals of Care: Goals include to maximize quality of life and symptom management. Our advance care planning conversation included a discussion about:    The value and importance of advance care planning  Experiences with loved ones who have been seriously ill or have died  Exploration of personal, cultural or spiritual beliefs that might influence medical decisions  Exploration of goals of care in the event of a sudden injury or illness  CODE STATUS: DNR  Symptom Management/Plan:  Dementia-continue to reorient/redirect as needed. Monitor for falls/safety. Family to continue supportive care. We discussed options as additional family support, day programs that patient can attend.   Decline in appetite, weight loss-patient eating 50% of meals. She has lost 8 pounds since June. Recommend continuing mirtazapine 7.5 mg QHS, eat foods patient enjoys, offer snacks throughout the day. Trial nutritional  supplements, such as shakes or puddings, add 1-2 supplements in daily.  Back pain-continue acetaminophen 650mg  BID.   Allergic rhinitis-symptoms consistent with allergies. Recommend: Stop loratadine, start allegra 180 mg daily.     Follow up Palliative Care Visit: Palliative care will continue to follow for complex medical decision making, advance care planning, and clarification of goals. Return in 6-8 weeks or prn.  I spent 40 minutes providing this consultation. More than 50% of the time in this consultation was spent in counseling and care coordination.   PPS: 40%  HOSPICE ELIGIBILITY/DIAGNOSIS: TBD  Chief Complaint: Palliative Medicine follow up visit.   HISTORY OF PRESENT ILLNESS:  Leslie Johnston is a 87 y.o. year old female  with dementia, osteoporosis without current pathological fracture, chronic lower back pain, hypertension, CKD 3, atrial fibrillation, allergic rhinitis, GERD, overactive bladder. Hx of compression fracture to thoracic spine, hx of pathological fracture of humerus.   Patient resides at home with family support. She reports doing well. Family expresses patient with increased forgetfulness. She has not had any recent functional declines. She uses cane for ambulation most of the time. No recent falls or injury. She denies pain at present; endorses occasional back pain or headaches. Family report patient sleeping around 12 hours a day. Patient endorses a good appetite, although family states she is eating around 50% of meals now. She has had some weight loss. She is taking mirtazapine 7.5 mg QHS. Patient is  taking Claritin daily for her allergies, still has clear runny nose, itchy eyes. No other symptoms. No recent infections, ED visits or hospitalizations. Family does express some caregiver fatigue. A 10-point review of systems is negative, except for the pertinent positives and negatives detailed in the HPI.    History obtained from review of EMR, discussion with  primary team, and interview with family, facility staff/caregiver and/or Leslie Johnston.  I reviewed available labs, medications, imaging, studies and related documents from the EMR.  Records reviewed and summarized above.    Physical Exam: Current weight: 122 pounds, 130 pounds 06/2020 Pulse 80, resp 16, b/p 112/58, sats 93% on room air Constitutional: NAD General: frail appearing, thin EYES: anicteric sclera, lids intact, no discharge  ENMT: intact hearing, oral mucous membranes moist, dentition intact CV: S1S2, RRR, no LE edema Pulmonary: LCTA, no increased work of breathing, no cough, room air Abdomen: normo-active BS + 4 quadrants, soft and non tender, no ascites GU: deferred MSK: moves all extremities, ambulatory with cane Skin: warm and dry, no rashes or wounds on visible skin Neuro:  no generalized weakness, A & O x 2, forgetful Psych: non-anxious affect, pleasant Hem/lymph/immuno: no widespread bruising   Thank you for the opportunity to participate in the care of Leslie Johnston.  The palliative care team will continue to follow. Please call our office at [phone] if we can be of additional assistance.   Ezekiel Slocumb, NP   COVID-19 PATIENT SCREENING TOOL Asked and negative response unless otherwise noted:   Have you had symptoms of covid, tested positive or been in contact with someone with symptoms/positive test in the past 5-10 days? No

## 2020-12-12 ENCOUNTER — Telehealth: Payer: Self-pay

## 2020-12-12 NOTE — Telephone Encounter (Signed)
11/23/20 @1030am : PC SW outreached patients daughter, [name], x2  per [facility] NP - L. Rivers, to assess needs and discuss resources.   Calls unsuccessful. SW LVM with contact info. Awaiting return call

## 2021-02-02 ENCOUNTER — Other Ambulatory Visit: Payer: Medicare Other | Admitting: Student

## 2021-02-02 ENCOUNTER — Other Ambulatory Visit: Payer: Self-pay

## 2021-02-02 DIAGNOSIS — F0390 Unspecified dementia without behavioral disturbance: Secondary | ICD-10-CM

## 2021-02-02 DIAGNOSIS — Z515 Encounter for palliative care: Secondary | ICD-10-CM

## 2021-02-02 DIAGNOSIS — R52 Pain, unspecified: Secondary | ICD-10-CM

## 2021-02-02 DIAGNOSIS — R630 Anorexia: Secondary | ICD-10-CM

## 2021-02-02 NOTE — Progress Notes (Signed)
[facility] Pall[facility] Consult Note Telephone: [phone]  Fax: [phone]    Date of encounter: 02/02/21 3:00 PM PATIENT NAME: Leslie Leslie Johnston [address] [place] [address]   [phone] (home)  DOB: 04/23/1925 MRN: [id number] PRIMARY CARE PROVIDER:    Olin Hauser, DO,  [place] [address] [phone]  REFERRING PROVIDER:   Olin Hauser, DO [address] [place],  [place] [address] [phone]  RESPONSIBLE PARTY:    Contact Information     Name Relation Home Work Mobile   [place] Daughter   [phone]   [name] Daughter   [phone]        I met face to face with patient and family in the home. Palliative Care was asked to follow this patient by consultation request of  Leslie Leslie Johnston * to address advance care planning and complex medical decision making. This is a follow up visit.                                   ASSESSMENT AND PLAN / RECOMMENDATIONS:   Advance Care Planning/Goals of Care: Goals include to maximize quality of life and symptom management. Patient/health care surrogate gave his/her permission to discuss. Our advance care planning conversation included a discussion about:    The value and importance of advance care planning  Experiences with loved ones who have been seriously ill or have died  Exploration of personal, cultural or spiritual beliefs that might influence medical decisions  Exploration of goals of care in the event of a sudden injury or illness  CODE STATUS: DNR  Symptom Management/Plan:  Dementia-patient with increased forgetfulness. Continue to reorient/redirect as needed. Monitor for falls/safety. Family to continue supportive care.  Appetite-fair overall. She has gained 2 pounds; current weight 124 pounds. Recommend continuing mirtazapine 7.5 mg QHS, eat foods patient enjoys, offer snacks throughout the day. Continue nutritional  supplements.   Pain-patient with chronic back pain; continue acetaminophen 660m BID. She also is now having occasional foot pain at night, which sounds more neuropathic from her description. Pain last only a few minutes; we discussed capsaicin cream PRN as pain is short duration and intermittent.    Follow up Palliative Care Visit: Palliative care will continue to follow for complex medical decision making, advance care planning, and clarification of goals. Return in 8 weeks or prn.  This visit was coded based on medical decision making (MDM).  PPS: 40%  HOSPICE ELIGIBILITY/DIAGNOSIS: TBD  Chief Complaint: Palliative Medicine follow up visit.   HISTORY OF PRESENT ILLNESS:  Leslie HISEis a 88y.o. year old female  with  dementia, osteoporosis without current pathological fracture, chronic lower back pain, hypertension, CKD 3, atrial fibrillation, allergic rhinitis, GERD, overactive bladder. Hx of compression fracture to thoracic spine, hx of pathological fracture of humerus.  Patient reports doing well. Daughter is present; states patient has been stable except she has started complaining of her "feet burning" just before she goes to sleep. Sensation lasts a couple of minutes and patient goes to sleep. She denies back pain at present; endorses occasional back pain. No shortness of breath, nausea, constipation. Her appetite is fair; she is eating foods she enjoys and is taking in supplements. She is sleeping well at night; sleeps late into the morning. No recent falls reported. No recent infections. Daughter was concerned that patient may have been getting sick  when she last saw cardiologist as she had some wheezing and albuterol inhaler was ordered PRN. A 10-point review of systems is negative, except for the pertinent positives and negatives detailed in the HPI. HPI and ROS gathered from both patient and family due to impaired cognition.   History obtained from review of EMR, discussion  with primary team, and interview with family, facility staff/caregiver and/or Leslie Leslie Johnston.  I reviewed available labs, medications, imaging, studies and related documents from the EMR.  Records reviewed and summarized above.    Physical Exam: Weight: 124 pounds Pulse 68, resp 16, b/p 130/80 Constitutional: NAD General: frail appearing, thin EYES: anicteric sclera, lids intact, no discharge  ENMT: intact hearing, oral mucous membranes moist, dentition intact CV: S1S2, RRR, no LE edema Pulmonary: LCTA, no increased work of breathing, no cough, room air Abdomen: normo-active BS + 4 quadrants, soft and non tender, no ascites GU: deferred MSK: moves all extremities, ambulatory Skin: warm and dry, no rashes or wounds on visible skin Neuro:  no generalized weakness, A & O x 2, forgetful Psych: non-anxious affect, pleasant Hem/lymph/immuno: no widespread bruising   Thank you for the opportunity to participate in the care of Leslie Leslie Johnston.  The palliative care team will continue to follow. Please call our office at [phone] if we can be of additional assistance.   Leslie Slocumb, NP   COVID-19 PATIENT SCREENING TOOL Asked and negative response unless otherwise noted:   Have you had symptoms of covid, tested positive or been in contact with someone with symptoms/positive test in the past 5-10 days? No

## 2021-03-01 ENCOUNTER — Telehealth: Payer: Self-pay | Admitting: Family Medicine

## 2021-03-01 NOTE — Telephone Encounter (Signed)
Pt's daughter called in wants oxygen to be discontinued for pt ?

## 2021-03-01 NOTE — Telephone Encounter (Signed)
Patient's daughter [name] called to discuss the discontinuing oxygen, she says her sister [name] was the one who called. I called Dee on cell phone and her mom's home phone, left messages on both to return the call to the office to speak to a nurse about the oxygen. Will need additional clarification on why she wants the oxygen discontinued for her mom. ?

## 2021-03-01 NOTE — Telephone Encounter (Signed)
Typically in order to discontinue Oxygen, patients are required to be seen to document their Oxygen saturation on room air at rest and with walking. There is a standard protocol. ? ?Her palliative provider should be able to do this in the home if possible - I can reach out to [facility] NP today as well. ? ?If they need an order from me as PCP that is fine, but usually we need confirmation to discontinue first. ? ?Nobie Putnam, DO ?[facility] ?03/01/2021, 1:19 PM ? ?

## 2021-03-01 NOTE — Telephone Encounter (Signed)
Dee calling:  ?Leslie Johnston reports they had discussed discontinuing O2 at previous appointment- but because mother had change in Palative nurse- she requested they wait until she became familiar with patient and care. Patient does not use O2 anymore- the pain medications she was taking previously decreased her O2 sat and now that she is not on them anymore- she has not needed the O2. ?[employer] will need order to discontinue O2 and daughter is requesting copy of order be  ?e-mailed to her so she can return the tank- [email or web address]. Advised the order may have to be faxed directly to company- will let her know PCP response. Contact [phone] ?

## 2021-03-01 NOTE — Telephone Encounter (Signed)
Hello, ? ?Yes, I can do this on her next palliative f/u visit. I will reach out to her daughter. ? ?Thanks, ? ?Phineas Real

## 2021-03-02 ENCOUNTER — Encounter: Payer: Self-pay | Admitting: Family Medicine

## 2021-03-09 ENCOUNTER — Other Ambulatory Visit: Payer: Self-pay | Admitting: Medical

## 2021-03-09 NOTE — Telephone Encounter (Signed)
Prescription refill request for Eliquis received. ?Indication:Afib ?Last office visit:11/22 ?Scr:0.7 ?Age: 88 ?Weight:55.4 kg ? ?Prescription refilled ? ?

## 2021-03-16 ENCOUNTER — Telehealth: Payer: Self-pay | Admitting: Family Medicine

## 2021-03-16 NOTE — Telephone Encounter (Signed)
Left message for patient to call back and schedule the Medicare Annual Wellness Visit (AWV) virtually or by telephone. ? ?Last AWV 08/26/18 ? ?Please schedule at anytime with [facility]. ? ?40 minute appointment ? ?Any questions, please call me at [phone]  ?

## 2021-03-16 NOTE — Telephone Encounter (Signed)
Left message for patient to call back and schedule the Medicare Annual Wellness Visit (AWV) virtually or by telephone. ? ?Last AWV 08/26/18 ? ?Please schedule at anytime with [facility]. ? ?40 minute appointment ? ?Any questions, please call me at [phone]  ?

## 2021-03-24 ENCOUNTER — Ambulatory Visit (INDEPENDENT_AMBULATORY_CARE_PROVIDER_SITE_OTHER): Payer: Medicare Other

## 2021-03-24 VITALS — Wt 122.0 lb

## 2021-03-24 DIAGNOSIS — Z0000 Encounter for general adult medical examination without abnormal findings: Secondary | ICD-10-CM | POA: Diagnosis not present

## 2021-03-24 NOTE — Progress Notes (Signed)
?Virtual Visit via Telephone Note ? ?I connected with  Leslie Johnston on 03/24/21 at  3:20 PM EDT by telephone and verified that I am speaking with the correct person using two identifiers. ? ?Location: ?Patient: home ?Provider: [facility] ?Persons participating in the virtual visit: patient/Nurse Health Advisor ?  ?I discussed the limitations, risks, security and privacy concerns of performing an evaluation and management service by telephone and the availability of in person appointments. The patient expressed understanding and agreed to proceed. ? ?Interactive audio and video telecommunications were attempted between this nurse and patient, however failed, due to patient having technical difficulties OR patient did not have access to video capability.  We continued and completed visit with audio only. ? ?Some vital signs may be absent or patient reported.  ? ?Leslie David, LPN ? ?Subjective:  ? Leslie Johnston is a 88 y.o. female who presents for Medicare Annual (Subsequent) preventive examination. ? ?Review of Systems    ? ?  ? ?   ?Objective:  ?  ?There were no vitals filed for this visit. ?There is no height or weight on file to calculate BMI. ? ? ?  05/15/2020  ? 10:36 PM 05/15/2020  ?  4:59 PM 02/28/2020  ?  6:45 PM 02/11/2020  ? 11:09 AM 02/10/2020  ?  8:20 PM 08/26/2018  ?  2:34 PM 11/26/2017  ?  8:14 AM  ?Advanced Directives  ?Does Patient Have a Medical Advance Directive? Yes No No No No No No  ?Type of Scientist, forensic Power of Attorney        ?Does patient want to make changes to medical advance directive? No - Patient declined        ?Would patient like information on creating a medical advance directive?    No - Patient declined No - Patient declined    ? ? ?Current Medications (verified) ?Outpatient Encounter Medications as of 03/24/2021  ?Medication Sig  ? acetaminophen (TYLENOL) 650 MG CR tablet Take 650 mg by mouth every 8 (eight) hours as needed for pain.  ? albuterol (VENTOLIN HFA) 108  (90 Base) MCG/ACT inhaler Inhale 1-2 puffs into the lungs every 6 (six) hours as needed for wheezing or shortness of breath.  ? apixaban (ELIQUIS) 2.5 MG TABS tablet TAKE 1 TABLET(2.5 MG) BY MOUTH TWICE DAILY  ? diltiazem (CARDIZEM CD) 180 MG 24 hr capsule Take 1 capsule (180 mg total) by mouth daily.  ? lisinopril (ZESTRIL) 20 MG tablet Take 1 tablet (20 mg total) by mouth daily.  ? loratadine (CLARITIN) 10 MG tablet Take 10 mg by mouth daily.  ? mirtazapine (REMERON) 7.5 MG tablet Take 7.5 mg by mouth at bedtime.  ? Multiple Vitamin (MULTIVITAMIN WITH MINERALS) TABS tablet Take 1 tablet by mouth daily with lunch.   ? Multiple Vitamins-Minerals (PRESERVISION AREDS 2+MULTI VIT PO) Take by mouth.  ? pantoprazole (PROTONIX) 40 MG tablet Take 1 tablet (40 mg total) by mouth daily before breakfast.  ? ?No facility-administered encounter medications on file as of 03/24/2021.  ? ? ?Allergies (verified) ?Amlodipine, Losartan, Penicillins, and Tramadol  ? ?History: ?Past Medical History:  ?Diagnosis Date  ? A-fib ([place])   ? Allergic rhinitis   ? Allergy   ? Chronic low back pain   ? GERD (gastroesophageal reflux disease)   ? Hypertension   ? ?Past Surgical History:  ?Procedure Laterality Date  ? EYE SURGERY    ? cataract extraction- Right  ? KYPHOPLASTY N/A 11/26/2017  ? Procedure:  KYPHOPLASTY-T12,L1;  Surgeon: Hessie Knows, MD;  Location: [facility] ORS;  Service: Orthopedics;  Laterality: N/A;  ? ?Family History  ?Problem Relation Age of Onset  ? Pancreatic cancer Sister   ? Arthritis Maternal Grandmother   ? Leukemia Maternal Grandfather   ? Asthma Maternal Grandfather   ? Breast cancer Daughter   ? Bladder Cancer Daughter   ? ?Social History  ? ?Socioeconomic History  ? Marital status: Widowed  ?  Spouse name: Not on file  ? Number of children: Not on file  ? Years of education: Not on file  ? Highest education level: Not on file  ?Occupational History  ? Not on file  ?Tobacco Use  ? Smoking status: Never  ? Smokeless tobacco:  Never  ?Vaping Use  ? Vaping Use: Never used  ?Substance and Sexual Activity  ? Alcohol use: No  ?  Alcohol/week: 0.0 standard drinks  ? Drug use: No  ? Sexual activity: Not on file  ?Other Topics Concern  ? Not on file  ?Social History Narrative  ? Not on file  ? ?Social Determinants of Health  ? ?Financial Resource Strain: Not on file  ?Food Insecurity: Not on file  ?Transportation Needs: Not on file  ?Physical Activity: Not on file  ?Stress: Not on file  ?Social Connections: Not on file  ? ? ?Tobacco Counseling ?Counseling given: Not Answered ? ? ?Clinical Intake: ? ?Pre-visit preparation completed: Yes ? ?Pain : No/denies pain ? ?  ? ?Nutritional Risks: None ?Diabetes: No ? ?How often do you need to have someone help you when you read instructions, pamphlets, or other written materials from your doctor or pharmacy?: 1 - Never ? ?Diabetic?no ? ?Interpreter Needed?: No ? ?Information entered by :: Kirke Shaggy, LPN ? ? ?Activities of Daily Living ? ?  05/15/2020  ? 10:36 PM  ?In your present state of health, do you have any difficulty performing the following activities:  ?Hearing? 1  ?Vision? 0  ?Difficulty concentrating or making decisions? 1  ?Walking or climbing stairs? 0  ?Dressing or bathing? 1  ?Doing errands, shopping? 1  ? ? ?Patient Care Team: ?Olin Hauser, DO as PCP - General (Family Medicine) ?Minna Merritts, MD as PCP - Cardiology (Cardiology) ? ?Indicate any recent Medical Services you may have received from other than [facility] providers in the past year (date may be approximate). ? ?   ?Assessment:  ? This is a routine wellness examination for Leslie Johnston. ? ?Hearing/Vision screen ?No results found. ? ?Dietary issues and exercise activities discussed: ?  ? ? Goals Addressed   ?None ?  ? ?Depression Screen ? ?  09/12/2020  ?  2:27 PM 04/15/2019  ?  2:48 PM 04/15/2019  ?  2:19 PM 08/26/2018  ?  2:33 PM 10/07/2017  ?  8:52 AM 07/09/2017  ? 11:29 AM 09/11/2016  ? 11:34 AM  ?PHQ 2/9 Scores  ?PHQ - 2  Score 2 0 0 0  0 0  ?PHQ- 9 Score 7 0       ?Exception Documentation     Patient refusal    ?  ?Fall Risk ? ?  09/12/2020  ?  2:27 PM 06/08/2020  ?  2:58 PM 04/15/2019  ?  2:19 PM 08/26/2018  ?  2:33 PM 07/09/2017  ? 11:29 AM  ?Fall Risk   ?Falls in the past year? 0 0 0 0 Yes  ?Number falls in past yr: 0 0 0  1  ?Injury with Fall?  0 0 0  Yes  ?Risk Factor Category      High Fall Risk  ?Follow up Falls evaluation completed Falls evaluation completed Falls evaluation completed  Falls prevention discussed  ? ? ?FALL RISK PREVENTION PERTAINING TO THE HOME: ? ?Any stairs in or around the home? Yes  ?If so, are there any without handrails? No  ?Home free of loose throw rugs in walkways, pet beds, electrical cords, etc? Yes  ?Adequate lighting in your home to reduce risk of falls? Yes  ? ?ASSISTIVE DEVICES UTILIZED TO PREVENT FALLS: ? ?Life alert? No  ?Use of a cane, walker or w/c? Yes  ?Grab bars in the bathroom? Yes  ?Shower chair or bench in shower? Yes  ?Elevated toilet seat or a handicapped toilet? No  ? ?Cognitive Function: ? ?  07/27/2014  ?  3:25 PM  ?MMSE - Mini Mental State Exam  ?Orientation to time 5  ?Orientation to Place 5  ?Registration 3  ?Attention/ Calculation 5  ?Recall 3  ?Language- name 2 objects 2  ?Language- repeat 1  ?Language- follow 3 step command 3  ?Language- read & follow direction 1  ?Write a sentence 1  ?Copy design 1  ?Total score 30  ? ?  ? ?  07/09/2017  ? 11:31 AM 06/19/2016  ? 10:25 AM  ?6CIT Screen  ?What Year? 0 points 0 points  ?What month? 0 points 0 points  ?What time? 0 points 0 points  ?Count back from 20 0 points 0 points  ?Months in reverse 0 points 0 points  ?Repeat phrase 2 points 0 points  ?Total Score 2 points 0 points  ? ? ?Immunizations ?Immunization History  ?Administered Date(s) Administered  ? Pneumococcal Conjugate-13 04/25/2013  ? Pneumococcal Polysaccharide-23 06/19/2016  ? ? ?TDAP status: Up to date ? ?Flu Vaccine status: Up to date ? ?Pneumococcal vaccine status: Up to  date ? ?Covid-19 vaccine status: Completed vaccines ? ?Qualifies for Shingles Vaccine? Yes   ?Zostavax completed Yes   ?Shingrix Completed?: No.    Education has been provided regarding the importance of this

## 2021-03-24 NOTE — Patient Instructions (Addendum)
Ms. Danek , ?Thank you for taking time to come for your Medicare Wellness Visit. I appreciate your ongoing commitment to your health goals. Please review the following plan we discussed and let me know if I can assist you in the future.  ? ?Screening recommendations/referrals: ?Colonoscopy: aged out ?Mammogram: aged out ?Bone Density: aged out ?Recommended yearly ophthalmology/optometry visit for glaucoma screening and checkup ?Recommended yearly dental visit for hygiene and checkup ? ?Vaccinations: ?Influenza vaccine: n/d ?Pneumococcal vaccine: 06/19/16 ?Tdap vaccine: 01/02/11 ?Shingles vaccine: Zostavax 07/27/14   ?Covid-19:07/24/19, 08/14/19 ? ?Advanced directives: yes, requested copy ? ?Conditions/risks identified: none ? ?Next appointment: Follow up in one year for your annual wellness visit 03/30/22 @ 3pm by phone ? ? ?Preventive Care 86 Years and Older, Female ?Preventive care refers to lifestyle choices and visits with your health care provider that can promote health and wellness. ?What does preventive care include? ?A yearly physical exam. This is also called an annual well check. ?Dental exams once or twice a year. ?Routine eye exams. Ask your health care provider how often you should have your eyes checked. ?Personal lifestyle choices, including: ?Daily care of your teeth and gums. ?Regular physical activity. ?Eating a healthy diet. ?Avoiding tobacco and drug use. ?Limiting alcohol use. ?Practicing safe sex. ?Taking low-dose aspirin every day. ?Taking vitamin and mineral supplements as recommended by your health care provider. ?What happens during an annual well check? ?The services and screenings done by your health care provider during your annual well check will depend on your age, overall health, lifestyle risk factors, and family history of disease. ?Counseling  ?Your health care provider may ask you questions about your: ?Alcohol use. ?Tobacco use. ?Drug use. ?Emotional well-being. ?Home and relationship  well-being. ?Sexual activity. ?Eating habits. ?History of falls. ?Memory and ability to understand (cognition). ?Work and work Statistician. ?Reproductive health. ?Screening  ?You may have the following tests or measurements: ?Height, weight, and BMI. ?Blood pressure. ?Lipid and cholesterol levels. These may be checked every 5 years, or more frequently if you are over 41 years old. ?Skin check. ?Lung cancer screening. You may have this screening every year starting at age 86 if you have a 30-pack-year history of smoking and currently smoke or have quit within the past 86 years. ?Fecal occult blood test (FOBT) of the stool. You may have this test every year starting at age 86. ?Flexible sigmoidoscopy or colonoscopy. You may have a sigmoidoscopy every 5 years or a colonoscopy every 10 years starting at age 86. ?Hepatitis C blood test. ?Hepatitis B blood test. ?Sexually transmitted disease (STD) testing. ?Diabetes screening. This is done by checking your blood sugar (glucose) after you have not eaten for a while (fasting). You may have this done every 1-3 years. ?Bone density scan. This is done to screen for osteoporosis. You may have this done starting at age 86. ?Mammogram. This may be done every 1-2 years. Talk to your health care provider about how often you should have regular mammograms. ?Talk with your health care provider about your test results, treatment options, and if necessary, the need for more tests. ?Vaccines  ?Your health care provider may recommend certain vaccines, such as: ?Influenza vaccine. This is recommended every year. ?Tetanus, diphtheria, and acellular pertussis (Tdap, Td) vaccine. You may need a Td booster every 10 years. ?Zoster vaccine. You may need this after age 86. ?Pneumococcal 13-valent conjugate (PCV13) vaccine. One dose is recommended after age 86. ?Pneumococcal polysaccharide (PPSV23) vaccine. One dose is recommended after age 86. ?Talk  to your health care provider about which  screenings and vaccines you need and how often you need them. ?This information is not intended to replace advice given to you by your health care provider. Make sure you discuss any questions you have with your health care provider. ?Document Released: 01/14/2015 Document Revised: 09/07/2015 Document Reviewed: 10/19/2014 ?Elsevier Interactive Patient Education ? 2017 [place]. ? ?Fall Prevention in the Home ?Falls can cause injuries. They can happen to people of all ages. There are many things you can do to make your home safe and to help prevent falls. ?What can I do on the outside of my home? ?Regularly fix the edges of walkways and driveways and fix any cracks. ?Remove anything that might make you trip as you walk through a door, such as a raised step or threshold. ?Trim any bushes or trees on the path to your home. ?Use bright outdoor lighting. ?Clear any walking paths of anything that might make someone trip, such as rocks or tools. ?Regularly check to see if handrails are loose or broken. Make sure that both sides of any steps have handrails. ?Any raised decks and porches should have guardrails on the edges. ?Have any leaves, snow, or ice cleared regularly. ?Use sand or salt on walking paths during winter. ?Clean up any spills in your garage right away. This includes oil or grease spills. ?What can I do in the bathroom? ?Use night lights. ?Install grab bars by the toilet and in the tub and shower. Do not use towel bars as grab bars. ?Use non-skid mats or decals in the tub or shower. ?If you need to sit down in the shower, use a plastic, non-slip stool. ?Keep the floor dry. Clean up any water that spills on the floor as soon as it happens. ?Remove soap buildup in the tub or shower regularly. ?Attach bath mats securely with double-sided non-slip rug tape. ?Do not have throw rugs and other things on the floor that can make you trip. ?What can I do in the bedroom? ?Use night lights. ?Make sure that you have a  light by your bed that is easy to reach. ?Do not use any sheets or blankets that are too big for your bed. They should not hang down onto the floor. ?Have a firm chair that has side arms. You can use this for support while you get dressed. ?Do not have throw rugs and other things on the floor that can make you trip. ?What can I do in the kitchen? ?Clean up any spills right away. ?Avoid walking on wet floors. ?Keep items that you use a lot in easy-to-reach places. ?If you need to reach something above you, use a strong step stool that has a grab bar. ?Keep electrical cords out of the way. ?Do not use floor polish or wax that makes floors slippery. If you must use wax, use non-skid floor wax. ?Do not have throw rugs and other things on the floor that can make you trip. ?What can I do with my stairs? ?Do not leave any items on the stairs. ?Make sure that there are handrails on both sides of the stairs and use them. Fix handrails that are broken or loose. Make sure that handrails are as long as the stairways. ?Check any carpeting to make sure that it is firmly attached to the stairs. Fix any carpet that is loose or worn. ?Avoid having throw rugs at the top or bottom of the stairs. If you do have throw rugs,  attach them to the floor with carpet tape. ?Make sure that you have a light switch at the top of the stairs and the bottom of the stairs. If you do not have them, ask someone to add them for you. ?What else can I do to help prevent falls? ?Wear shoes that: ?Do not have high heels. ?Have rubber bottoms. ?Are comfortable and fit you well. ?Are closed at the toe. Do not wear sandals. ?If you use a stepladder: ?Make sure that it is fully opened. Do not climb a closed stepladder. ?Make sure that both sides of the stepladder are locked into place. ?Ask someone to hold it for you, if possible. ?Clearly mark and make sure that you can see: ?Any grab bars or handrails. ?First and last steps. ?Where the edge of each step  is. ?Use tools that help you move around (mobility aids) if they are needed. These include: ?Canes. ?Walkers. ?Scooters. ?Crutches. ?Turn on the lights when you go into a dark area. Replace any light bulbs as

## 2021-03-30 ENCOUNTER — Other Ambulatory Visit: Payer: Medicare Other | Admitting: Student

## 2021-03-30 DIAGNOSIS — R638 Other symptoms and signs concerning food and fluid intake: Secondary | ICD-10-CM

## 2021-03-30 DIAGNOSIS — F0390 Unspecified dementia without behavioral disturbance: Secondary | ICD-10-CM | POA: Diagnosis not present

## 2021-03-30 DIAGNOSIS — R52 Pain, unspecified: Secondary | ICD-10-CM

## 2021-03-30 DIAGNOSIS — Z515 Encounter for palliative care: Secondary | ICD-10-CM

## 2021-03-30 NOTE — Progress Notes (Signed)
? ? ?Manufacturing engineer ?Community Palliative Care Consult Note ?Telephone: [phone]  ?Fax: [phone]  ? ? ?Date of encounter: 03/30/21 ?3:10 PM ?PATIENT NAME: Leslie Johnston ?[address] ?[place] [address]   ?[phone] (home)  ?DOB: Jul 24, 1925 ?MRN: [id number] ?PRIMARY CARE PROVIDER:    ?Olin Hauser, DO,  ?[address] ?[place] [address] ?[phone] ? ?REFERRING PROVIDER:   ?Olin Hauser, DO ?[address] ?[place],  [place] [address] ?[phone] ? ?RESPONSIBLE PARTY:    ?Contact Information   ? ? Name Relation Home Work Mobile  ? Britt Boozer Daughter   [phone]  ? Murphy,Dee Daughter   [phone]  ? ?  ? ? ? ?I met face to face with patient and family in the home. Palliative Care was asked to follow this patient by consultation request of  Nobie Putnam * to address advance care planning and complex medical decision making. This is a follow up visit. ? ?                                 ASSESSMENT AND PLAN / RECOMMENDATIONS:  ? ?Advance Care Planning/Goals of Care: Goals include to maximize quality of life and symptom management. Patient/health care surrogate gave his/her permission to discuss. ?CODE STATUS: DNR ? ?Palliative medicine will continue to provide supportive care, symptom management.  ? ?Symptom Management/Plan: ? ?Dementia-Continue to reorient/redirect as needed. Monitor for falls/safety. Family to continue supportive care. ? ?Appetite-patient's appetite has improved. No weight loss reported. Continue mirtazapine 7.5 mg QHS. Encourage foods patient enjoys, offer snacks and nutritional supplements throughout the day.  ? ?Pain-patient with chronic back pain; continue acetaminophen 664m BID ?  ? ?Follow up Palliative Care Visit: Palliative care will continue to follow for complex medical decision making, advance care planning, and clarification of goals. Return in 8 weeks or prn. ? ? ?This visit was coded based on medical decision making  (MDM). ? ?PPS: 40% ? ?HOSPICE ELIGIBILITY/DIAGNOSIS: TBD ? ?Chief Complaint: Palliative Medicine follow up visit.  ? ?HISTORY OF PRESENT ILLNESS:  CEDYNN GILLOCKis a 88y.o. year old female  with dementia, osteoporosis without current pathological fracture, chronic lower back pain, hypertension, CKD 3, atrial fibrillation, allergic rhinitis, GERD, overactive bladder. Hx of compression fracture to thoracic spine, hx of pathological fracture of humerus.  ? ?Patient reports doing well; she or family deny any acute changes. She denies pain, shortness of breath or wheezing, nausea, constipation. She does sleep around 11 hours, arises late in the morning. No falls reported. Her appetite has improved; no weight loss reported.  Patient's oxygen was removed from the home; she had not been using it. A 10-point review of systems is negative, except for the pertinent positives and negatives detailed in the HPI. HPI and ROS gathered from both patient and family due to impaired cognition.  ? ?History obtained from review of EMR, discussion with primary team, and interview with family, facility staff/caregiver and/or Ms. Kubin.  ?I reviewed available labs, medications, imaging, studies and related documents from the EMR.  Records reviewed and summarized above.  ? ? ?Physical Exam: ?Pulse 88, resp 20, 132/88, sats 97% on room air ?Constitutional: NAD ?General: frail appearing, thin ?EYES: anicteric sclera, lids intact, no discharge  ?ENMT: intact hearing, oral mucous membranes moist, dentition intact ?CV: S1S2, RRR, no LE edema ?Pulmonary: LCTA, no increased work of breathing, no cough, room air ?Abdomen:  normo-active BS +  4 quadrants, soft and non tender, no ascites ?GU: deferred ?MSK: + sarcopenia, moves all extremities, ambulatory ?Skin: warm and dry, no rashes or wounds on visible skin ?Neuro:  no generalized weakness ?Psych: non-anxious affect, A and O x 2, forgetful, pleasant ?Hem/lymph/immuno: no widespread  bruising ? ? ?Thank you for the opportunity to participate in the care of Ms. Coppens.  The palliative care team will continue to follow. Please call our office at [phone] if we can be of additional assistance.  ? ?Ezekiel Slocumb, NP  ? ?COVID-19 PATIENT SCREENING TOOL ?Asked and negative response unless otherwise noted:  ? ?Have you had symptoms of covid, tested positive or been in contact with someone with symptoms/positive test in the past 5-10 days? No ? ?

## 2021-05-04 ENCOUNTER — Other Ambulatory Visit: Payer: Self-pay | Admitting: Family Medicine

## 2021-05-04 DIAGNOSIS — R630 Anorexia: Secondary | ICD-10-CM

## 2021-05-04 NOTE — Telephone Encounter (Signed)
Requested medication (s) are due for refill today:   Not sure ? ?Requested medication (s) are on the active medication list:   Listed as historical medication without any dates or information ? ?Future visit scheduled:   No ? ? ?Last ordered: Don't know ? ?Returned because this is being requested however it's from a historical provider.    ? ?Requested Prescriptions  ?Pending Prescriptions Disp Refills  ? mirtazapine (REMERON) 7.5 MG tablet [Pharmacy Med Name: MIRTAZAPINE 7.'5MG'$  TABLETS] 90 tablet   ?  Sig: TAKE 1 TABLET(7.5 MG) BY MOUTH AT BEDTIME  ?  ? Psychiatry: Antidepressants - mirtazapine Passed - 05/04/2021 11:46 AM  ?  ?  Passed - Completed PHQ-2 or PHQ-9 in the last 360 days  ?  ?  Passed - Valid encounter within last 6 months  ?  Recent Outpatient Visits   ? ?      ? 7 months ago Postmenopausal osteoporosis with pathological fracture of humerus  ? [facility] Olin Hauser, DO  ? 11 months ago Compression fracture of thoracic vertebra, unspecified thoracic vertebral level, sequela  ? [place], DO  ? 1 year ago Chronic bilateral low back pain without sciatica  ? [place], DO  ? 2 years ago Benign hypertension with CKD (chronic kidney disease) stage III  ? [place], DO  ? 3 years ago Left hip pain  ? [facility] Merrilyn Puma, Jerrel Ivory, NP  ? ?  ?  ? ? ?  ?  ?  ? ?

## 2021-05-19 ENCOUNTER — Other Ambulatory Visit: Payer: Self-pay | Admitting: Family Medicine

## 2021-05-19 DIAGNOSIS — K219 Gastro-esophageal reflux disease without esophagitis: Secondary | ICD-10-CM

## 2021-05-19 NOTE — Telephone Encounter (Signed)
Requested Prescriptions  Pending Prescriptions Disp Refills  . pantoprazole (PROTONIX) 40 MG tablet [Pharmacy Med Name: PANTOPRAZOLE '40MG'$  TABLETS] 90 tablet 3    Sig: TAKE 1 TABLET BY MOUTH DAILY BEFORE BREAKFAST     Gastroenterology: Proton Pump Inhibitors Passed - 05/19/2021 11:44 AM      Passed - Valid encounter within last 12 months    Recent Outpatient Visits          8 months ago Postmenopausal osteoporosis with pathological fracture of humerus   [facility], Devonne Doughty, DO   11 months ago Compression fracture of thoracic vertebra, unspecified thoracic vertebral level, sequela   [place], DO   1 year ago Chronic bilateral low back pain without sciatica   [place], DO   2 years ago Benign hypertension with CKD (chronic kidney disease) stage III   [place], DO   3 years ago Left hip pain   [name], NP

## 2021-06-02 ENCOUNTER — Other Ambulatory Visit: Payer: Medicare Other | Admitting: Student

## 2021-06-02 DIAGNOSIS — R638 Other symptoms and signs concerning food and fluid intake: Secondary | ICD-10-CM | POA: Diagnosis not present

## 2021-06-02 DIAGNOSIS — F0390 Unspecified dementia without behavioral disturbance: Secondary | ICD-10-CM

## 2021-06-02 DIAGNOSIS — Z515 Encounter for palliative care: Secondary | ICD-10-CM | POA: Diagnosis not present

## 2021-06-02 NOTE — Progress Notes (Signed)
[facility] Consult Note Telephone: [phone]  Fax: [phone]    Date of encounter: 06/02/21 2:05 PM PATIENT NAME: Leslie Johnston [address] [place] [address]   [phone] (home)  DOB: 1925-12-03 MRN: [id number] PRIMARY CARE PROVIDER:    Olin Hauser, DO,  [place] [address] [phone]  REFERRING PROVIDER:   Olin Hauser, DO [address] [place],   [address] [phone]  RESPONSIBLE PARTY:    Contact Information     Name Relation Home Work Mobile   [place] Daughter   [phone]   [name] Daughter   [phone]        I met face to face with patient and family in the home. Palliative Care was asked to follow this patient by consultation request of  Nobie Putnam * to address advance care planning and complex medical decision making. This is a follow up visit.                                   ASSESSMENT AND PLAN / RECOMMENDATIONS:   Advance Care Planning/Goals of Care: Goals include to maximize quality of life and symptom management. Patient/health care surrogate gave his/her permission to discuss. Our advance care planning conversation included a discussion about:    The value and importance of advance care planning  Experiences with loved ones who have been seriously ill or have died  Exploration of personal, cultural or spiritual beliefs that might influence medical decisions  Exploration of goals of care in the event of a sudden injury or illness  CODE STATUS: DNR  Palliative medicine will continue to provide supportive care, symptom management.   Symptom Management/Plan:  Dementia-patient with increased forgetfulness; she is repeating herself more. Continue to reorient/redirect as needed. Monitor for falls/safety. Family to continue supportive care. Encourage daily routine.   Appetite-patient with fair appetite overall. She has gained 2  pounds since last visit. Continue mirtazapine 7.5 mg QHS. Encourage foods she enjoys, nutritional supplements PRN.   Follow up Palliative Care Visit: Palliative care will continue to follow for complex medical decision making, advance care planning, and clarification of goals. Return IN 6-8 weeks or prn.   This visit was coded based on medical decision making (MDM).  PPS: 40%  HOSPICE ELIGIBILITY/DIAGNOSIS: TBD  Chief Complaint: Palliative Medicine follow up visit.   HISTORY OF PRESENT ILLNESS:  Leslie Johnston is a 88 y.o. year old female  with dementia, osteoporosis without current pathological fracture, chronic lower back pain, hypertension, CKD 3, atrial fibrillation, allergic rhinitis, GERD, overactive bladder. Hx of compression fracture to thoracic spine, hx of pathological fracture of humerus.    Patient has been doing well. Daughter is present. She denies pain, shortness of breath, nausea, constipation. Appetite is good; she has gained 2 pounds, up to 126 pounds. Drinking nutritional supplements as well. She is sleeping in late, until around 1030-11am. She is repeating herself more. A 10-point review of systems is negative, except for the pertinent positives and negatives detailed in the HPI. HPI and ROS gathered from both patient and family due to impaired cognition.   History obtained from review of EMR, discussion with primary team, and interview with family, facility staff/caregiver and/or [name].  I reviewed available labs, medications, imaging, studies and related documents from the EMR.  Records reviewed and summarized above.   Physical Exam Weight: 126 pounds  Pulse 64, resp 16, b/p 110/62, sats 97% on room air Constitutional: NAD General: frail appearing, thin EYES: anicteric sclera, lids intact, no discharge  ENMT: intact hearing, oral mucous membranes moist, dentition intact CV: S1S2, RRR, no LE edema Pulmonary: LCTA, no increased work of breathing, no cough, room  air Abdomen: intake 100%, normo-active BS + 4 quadrants, soft and non tender, no ascites GU: deferred MSK: moves all extremities, ambulatory Skin: warm and dry, no rashes or wounds on visible skin Neuro:  no generalized weakness,   Psych: non-anxious affect, A and O x 2, forgetful, pleasant Hem/lymph/immuno: no widespread bruising   Thank you for the opportunity to participate in the care of [name].  The palliative care team will continue to follow. Please call our office at [phone] if we can be of additional assistance.   Ezekiel Slocumb, NP   COVID-19 PATIENT SCREENING TOOL Asked and negative response unless otherwise noted:   Have you had symptoms of covid, tested positive or been in contact with someone with symptoms/positive test in the past 5-10 days? No

## 2021-07-04 ENCOUNTER — Other Ambulatory Visit: Payer: Self-pay | Admitting: Family Medicine

## 2021-07-04 DIAGNOSIS — N183 Chronic kidney disease, stage 3 unspecified: Secondary | ICD-10-CM

## 2021-07-05 NOTE — Telephone Encounter (Signed)
Requested medication (s) are due for refill today: yes  Requested medication (s) are on the active medication list: yes    Last refill: 06/08/20  #90  3 refills  Future visit scheduled yes 03/30/22  Notes to clinic:Failed due to labs, please review. Thank you.  Requested Prescriptions  Pending Prescriptions Disp Refills   lisinopril (ZESTRIL) 20 MG tablet [Pharmacy Med Name: LISINOPRIL '20MG'$  TABLETS] 90 tablet 3    Sig: TAKE 1 TABLET(20 MG) BY MOUTH DAILY     Cardiovascular:  ACE Inhibitors Failed - 07/04/2021 11:16 AM      Failed - Cr in normal range and within 180 days    Creat  Date Value Ref Range Status  04/23/2019 0.91 (H) 0.60 - 0.88 mg/dL Final    Comment:    For patients >61 years of age, the reference limit for Creatinine is approximately 13% higher for people identified as African-American. .    Creatinine, Ser  Date Value Ref Range Status  05/16/2020 0.71 0.44 - 1.00 mg/dL Final         Failed - K in normal range and within 180 days    Potassium  Date Value Ref Range Status  05/16/2020 4.2 3.5 - 5.1 mmol/L Final         Failed - Last BP in normal range    BP Readings from Last 1 Encounters:  11/23/20 (!) 140/92         Passed - Patient is not pregnant      Passed - Valid encounter within last 6 months    Recent Outpatient Visits           9 months ago Postmenopausal osteoporosis with pathological fracture of humerus   [place], DO   1 year ago Compression fracture of thoracic vertebra, unspecified thoracic vertebral level, sequela   [place], DO   1 year ago Chronic bilateral low back pain without sciatica   [place], DO   2 years ago Benign hypertension with CKD (chronic kidney disease) stage III   [place], DO   3 years ago Left hip pain   [facility] [name], NP

## 2021-08-01 ENCOUNTER — Other Ambulatory Visit: Payer: Self-pay | Admitting: Family Medicine

## 2021-08-01 DIAGNOSIS — R630 Anorexia: Secondary | ICD-10-CM

## 2021-08-02 NOTE — Telephone Encounter (Signed)
Requested Prescriptions  Pending Prescriptions Disp Refills  . mirtazapine (REMERON) 7.5 MG tablet [Pharmacy Med Name: MIRTAZAPINE 7.'5MG'$  TABLETS] 90 tablet 1    Sig: TAKE 1 TABLET(7.5 MG) BY MOUTH AT BEDTIME     Psychiatry: Antidepressants - mirtazapine Passed - 08/01/2021  8:09 AM      Passed - Completed PHQ-2 or PHQ-9 in the last 360 days      Passed - Valid encounter within last 6 months    Recent Outpatient Visits          10 months ago Postmenopausal osteoporosis with pathological fracture of humerus   [place], DO   1 year ago Compression fracture of thoracic vertebra, unspecified thoracic vertebral level, sequela   [place], DO   1 year ago Chronic bilateral low back pain without sciatica   [place], DO   2 years ago Benign hypertension with CKD (chronic kidney disease) stage III   [place], DO   3 years ago Left hip pain   [facility] [name], NP

## 2021-08-03 ENCOUNTER — Other Ambulatory Visit: Payer: Medicare Other | Admitting: Student

## 2021-08-03 DIAGNOSIS — R638 Other symptoms and signs concerning food and fluid intake: Secondary | ICD-10-CM

## 2021-08-03 DIAGNOSIS — R062 Wheezing: Secondary | ICD-10-CM

## 2021-08-03 DIAGNOSIS — F0390 Unspecified dementia without behavioral disturbance: Secondary | ICD-10-CM

## 2021-08-03 DIAGNOSIS — Z515 Encounter for palliative care: Secondary | ICD-10-CM | POA: Diagnosis not present

## 2021-08-03 NOTE — Progress Notes (Signed)
[facility] Consult Note Telephone: [phone]  Fax: [phone]    Date of encounter: 08/03/21 3:05 PM PATIENT NAME: Leslie Johnston [address] [place] [address]   [phone] (home)  DOB: September 09, 1925 MRN: [id number] PRIMARY CARE PROVIDER:    Olin Hauser, DO,  [place] [address] [phone]  REFERRING PROVIDER:   Olin Hauser, DO [address] [place],  [place] [address] [phone]  RESPONSIBLE PARTY:    Contact Information     Name Relation Home Work Mobile   [place] Daughter   [phone]   [name] Daughter   [phone]        I met face to face with patient and family in the home. Palliative Care was asked to follow this patient by consultation request of  Nobie Putnam * to address advance care planning and complex medical decision making. This is a follow up visit.                                   ASSESSMENT AND PLAN / RECOMMENDATIONS:   Advance Care Planning/Goals of Care: Goals include to maximize quality of life and symptom management. Patient/health care surrogate gave his/her permission to discuss. CODE STATUS: DNR  Education provided on Palliative Medicine. Will continue to provide ongoing support, symptom management as needed.  Symptom Management/Plan:  Dementia-patient with increased forgetfulness. Continue to reorient and redirect as needed, maintain daily schedule. Supportive care from family. Monitor for falls/safety. Encourage use of cane with ambulation.   Wheezing-patient with inspiratory wheezing today upon auscultation; improved after albuterol. Recommend albuterol 1-2 puffs every 6 hours PRN. Script sent for spacer.  Appetite-fair appetite; encourage nutritional supplements, increase protein intake. Offer foods patient enjoys. Continue mirtazapine 7.5 mg QHS.  Follow up Palliative Care Visit: Palliative care will continue to follow  for complex medical decision making, advance care planning, and clarification of goals. Return in weeks or prn.  This visit was coded based on medical decision making (MDM).  PPS: 40%  HOSPICE ELIGIBILITY/DIAGNOSIS: TBD  Chief Complaint: Palliative Medicine follow up visit.   HISTORY OF PRESENT ILLNESS:  Leslie Johnston is a 88 y.o. year old female  with dementia, osteoporosis without current pathological fracture, chronic lower back pain, hypertension, CKD 3, atrial fibrillation, allergic rhinitis, GERD, overactive bladder. Hx of compression fracture to thoracic spine, hx of pathological fracture of humerus.    Patient reports doing well. She denies pain, shortness of breath, nausea, constipation. She continues to sleep in late each morning, occasional naps during the day. No recent infections, no falls. Fair appetite reported per family; will occasionally drink supplements.   Patient received in living room. Pleasant affect. She does repeat herself during visit. Daughter's [name] and Hassan Rowan are present. A 10-point review of systems is negative, except for the pertinent positives and negatives detailed in the HPI. HPI and ROS gathered from both patient and family due to impaired cognition.   History obtained from review of EMR, discussion with primary team, and interview with family, facility staff/caregiver and/or Ms. Tolleson.  I reviewed available labs, medications, imaging, studies and related documents from the EMR.  Records reviewed and summarized above.    Physical Exam: Weight: 124.4 pounds Pulse 60, resp 20, b/p 120/68, sats 95% on room air Constitutional: NAD General: frail appearing, thin EYES: anicteric sclera, lids intact, no discharge  ENMT: intact hearing, oral  mucous membranes moist, dentition intact CV: S1S2, RRR, no LE edema Pulmonary: inspiratory wheeze right lobes. No increased work of breathing, no cough, room air Abdomen:normo-active BS + 4 quadrants, soft and non  tender, no ascites GU: deferred MSK: moves all extremities, ambulatory Skin: warm and dry, no rashes or wounds on visible skin Neuro: generalized weakness, A & O x 2, forgetful Psych: non-anxious affect Hem/lymph/immuno: no widespread bruising   Thank you for the opportunity to participate in the care of Ms. Burggraf.  The palliative care team will continue to follow. Please call our office at [phone] if we can be of additional assistance.   Ezekiel Slocumb, NP   COVID-19 PATIENT SCREENING TOOL Asked and negative response unless otherwise noted:   Have you had symptoms of covid, tested positive or been in contact with someone with symptoms/positive test in the past 5-10 days? No

## 2021-08-14 ENCOUNTER — Emergency Department
Admission: EM | Admit: 2021-08-14 | Discharge: 2021-08-14 | Disposition: A | Discharge status: Home / Self Care | Payer: Medicare Other | Attending: Emergency Medicine | Admitting: Emergency Medicine

## 2021-08-14 ENCOUNTER — Emergency Department: Payer: Medicare Other

## 2021-08-14 ENCOUNTER — Other Ambulatory Visit: Payer: Self-pay

## 2021-08-14 DIAGNOSIS — K5730 Diverticulosis of large intestine without perforation or abscess without bleeding: Secondary | ICD-10-CM | POA: Diagnosis not present

## 2021-08-14 DIAGNOSIS — K8020 Calculus of gallbladder without cholecystitis without obstruction: Secondary | ICD-10-CM | POA: Diagnosis not present

## 2021-08-14 DIAGNOSIS — N281 Cyst of kidney, acquired: Secondary | ICD-10-CM | POA: Diagnosis not present

## 2021-08-14 DIAGNOSIS — I129 Hypertensive chronic kidney disease with stage 1 through stage 4 chronic kidney disease, or unspecified chronic kidney disease: Secondary | ICD-10-CM | POA: Diagnosis not present

## 2021-08-14 DIAGNOSIS — N183 Chronic kidney disease, stage 3 unspecified: Secondary | ICD-10-CM | POA: Diagnosis not present

## 2021-08-14 DIAGNOSIS — R1084 Generalized abdominal pain: Secondary | ICD-10-CM | POA: Insufficient documentation

## 2021-08-14 LAB — CBC WITH DIFFERENTIAL/PLATELET
Abs Immature Granulocytes: 0.02 10*3/uL (ref 0.00–0.07)
Basophils Absolute: 0 10*3/uL (ref 0.0–0.1)
Basophils Relative: 0 %
Eosinophils Absolute: 0.1 10*3/uL (ref 0.0–0.5)
Eosinophils Relative: 1 %
HCT: 42.8 % (ref 36.0–46.0)
Hemoglobin: 13.3 g/dL (ref 12.0–15.0)
Immature Granulocytes: 0 %
Lymphocytes Relative: 18 %
Lymphs Abs: 1.2 10*3/uL (ref 0.7–4.0)
MCH: 27.6 pg (ref 26.0–34.0)
MCHC: 31.1 g/dL (ref 30.0–36.0)
MCV: 88.8 fL (ref 80.0–100.0)
Monocytes Absolute: 0.4 10*3/uL (ref 0.1–1.0)
Monocytes Relative: 6 %
Neutro Abs: 5 10*3/uL (ref 1.7–7.7)
Neutrophils Relative %: 75 %
Platelets: 219 10*3/uL (ref 150–400)
RBC: 4.82 MIL/uL (ref 3.87–5.11)
RDW: 13.2 % (ref 11.5–15.5)
WBC: 6.8 10*3/uL (ref 4.0–10.5)
nRBC: 0 % (ref 0.0–0.2)

## 2021-08-14 LAB — COMPREHENSIVE METABOLIC PANEL
ALT: 11 U/L (ref 0–44)
AST: 19 U/L (ref 15–41)
Albumin: 4.2 g/dL (ref 3.5–5.0)
Alkaline Phosphatase: 56 U/L (ref 38–126)
Anion gap: 7 (ref 5–15)
BUN: 18 mg/dL (ref 8–23)
CO2: 25 mmol/L (ref 22–32)
Calcium: 10 mg/dL (ref 8.9–10.3)
Chloride: 106 mmol/L (ref 98–111)
Creatinine, Ser: 0.92 mg/dL (ref 0.44–1.00)
GFR, Estimated: 58 mL/min — ABNORMAL LOW (ref >60)
Glucose, Bld: 110 mg/dL — ABNORMAL HIGH (ref 70–99)
Potassium: 4.8 mmol/L (ref 3.5–5.1)
Sodium: 138 mmol/L (ref 135–145)
Total Bilirubin: 0.6 mg/dL (ref 0.3–1.2)
Total Protein: 7.3 g/dL (ref 6.5–8.1)

## 2021-08-14 LAB — LIPASE, BLOOD: Lipase: 33 U/L (ref 11–51)

## 2021-08-14 LAB — URINALYSIS, ROUTINE W REFLEX MICROSCOPIC
Bilirubin Urine: NEGATIVE
Glucose, UA: NEGATIVE mg/dL
Hgb urine dipstick: NEGATIVE
Ketones, ur: NEGATIVE mg/dL
Nitrite: NEGATIVE
Protein, ur: NEGATIVE mg/dL
Specific Gravity, Urine: 1.005 (ref 1.005–1.030)
pH: 5 (ref 5.0–8.0)

## 2021-08-14 MED ORDER — IOHEXOL 300 MG/ML  SOLN
100.0000 mL | Freq: Once | INTRAMUSCULAR | Status: AC | PRN
  Administered 2021-08-14: 100 mL via INTRAVENOUS

## 2021-08-14 MED ORDER — ACETAMINOPHEN 500 MG PO TABS
1000.0000 mg | ORAL_TABLET | Freq: Once | ORAL | Status: AC
  Administered 2021-08-14: 1000 mg via ORAL
  Filled 2021-08-14: qty 2

## 2021-08-14 MED ORDER — ALUM & MAG HYDROXIDE-SIMETH 200-200-20 MG/5ML PO SUSP
30.0000 mL | Freq: Once | ORAL | Status: AC
  Administered 2021-08-14: 30 mL via ORAL
  Filled 2021-08-14: qty 30

## 2021-08-14 NOTE — ED Triage Notes (Signed)
Pt presents to ER with c/o generalized abd pain that started today and has been easing off since then.  Pt denies n/v/d and states last BM was today and somewhat loose in nature.  Pt states pain at this time is in left side more than all over.  Pt denies any abd problems or surgeries in the past.  Pt is A&O x4 at this time in NAD.

## 2021-08-14 NOTE — ED Notes (Signed)
Pt tolerated PO challenge.  MD notified.  Pt to be discharged.

## 2021-08-14 NOTE — ED Provider Triage Note (Signed)
Emergency Medicine Provider Triage Evaluation Note  Leslie Johnston , a 88 y.o. female  was evaluated in triage.  Pt complains of low abdominal pain that is intermittent, but got worse today.  No N/V/D. Normal BM at 430pm. No dysuria. Points to her LLQ    Review of Systems  Positive: Abd pain Negative: N/v/d, dysuria  Physical Exam  BP (!) 189/77 (BP Location: Left Arm)   Pulse 73   Temp 98.7 F (37.1 C) (Oral)   Resp 18   Ht '5\' 6"'$  (1.676 m)   Wt 56.7 kg   SpO2 96%   BMI 20.18 kg/m  Gen:   Awake, no distress   Resp:  Normal effort  MSK:   Moves extremities without difficulty  Other:    Medical Decision Making  Medically screening exam initiated at 7:30 PM.  Appropriate orders placed.  [name] was informed that the remainder of the evaluation will be completed by another provider, this initial triage assessment does not replace that evaluation, and the importance of remaining in the ED until their evaluation is complete.     Emeline Gins 08/14/21 1934

## 2021-08-14 NOTE — ED Notes (Signed)
Pt given sandwich tray and drink to PO challenge.

## 2021-08-14 NOTE — Discharge Instructions (Addendum)
Your laboratory work and CAT scan were reassuring today.  You can take Tylenol for any recurrent abdominal pain.  Please follow-up with your primary care provider.  If your pain returns and is not improving or you are unable to eat or drink or develop fevers please return to the emergency department.

## 2021-08-15 ENCOUNTER — Encounter: Payer: Self-pay | Admitting: Family Medicine

## 2021-08-15 NOTE — ED Provider Notes (Signed)
[facility] Provider Note    Event Date/Time   First MD Initiated Contact with Patient 08/14/21 2234     (approximate)   History   Abdominal Pain   HPI  Leslie Johnston is a 88 y.o. female past medical history of atrial fibrillation, GERD, hypertension who presents with abdominal pain.  Symptoms started earlier today pain is located in lower abdomen is diffuse.  Not associated constipation nausea vomiting.  Patient had normal bowel movement this morning.  Denies fevers chills or urinary symptoms.  Patient's family notes that she has had several episodes in the last several weeks that are similar has not come to the hospital for them because they resolved on their own.  She denies chest pain.    Past Medical History:  Diagnosis Date   A-fib [facility])    Allergic rhinitis    Allergy    Chronic low back pain    GERD (gastroesophageal reflux disease)    Hypertension     Patient Active Problem List   Diagnosis Date Noted   Atherosclerosis of aorta ([place]) 09/12/2020   Radiculopathy, lumbar region 05/19/2020   Spinal stenosis of lumbar region 05/19/2020   Unspecified dementia, unspecified severity, without behavioral disturbance, psychotic disturbance, mood disturbance, and anxiety ([place]) 05/19/2020   Acute epigastric pain 05/15/2020   A-fib ([place])    GERD (gastroesophageal reflux disease)    Hypertension    Chronic bilateral low back pain without sciatica 03/08/2020   Atrial fibrillation with RVR ([place]) 02/10/2020   OAB (overactive bladder) 04/15/2019   Dysthymia 04/15/2019   Dependence on supplemental oxygen 01/01/2018   Long-term current use of opiate analgesic 01/01/2018   Postmenopausal osteoporosis with pathological fracture of humerus 09/13/2016   Humerus fracture 08/29/2016   Chronic seasonal allergic rhinitis 03/20/2016   Unspecified urinary incontinence 01/02/2016   Abnormal chest x-ray 09/19/2015   Benign hypertension with CKD (chronic kidney  disease) stage III 07/27/2014     Physical Exam  Triage Vital Signs: ED Triage Vitals  Enc Vitals Group     BP 08/14/21 1926 (!) 189/77     Pulse Rate 08/14/21 1926 73     Resp 08/14/21 1926 18     Temp 08/14/21 1926 98.7 F (37.1 C)     Temp Source 08/14/21 1926 Oral     SpO2 08/14/21 1926 96 %     Weight 08/14/21 1926 125 lb (56.7 kg)     Height 08/14/21 1926 '5\' 6"'$  (1.676 m)     Head Circumference --      Peak Flow --      Pain Score 08/14/21 1933 8     Pain Loc --      Pain Edu? --      Excl. in [place]? --     Most recent vital signs: Vitals:   08/14/21 1926 08/14/21 2249  BP: (!) 189/77 (!) 185/85  Pulse: 73 73  Resp: 18 16  Temp: 98.7 F (37.1 C) 97.7 F (36.5 C)  SpO2: 96% 97%     General: Awake, no distress.  CV:  Good peripheral perfusion.  Resp:  Normal effort.  Abd:  No distention.  Mild tenderness in bilateral lower quadrants but no guarding Neuro:             Awake, Alert, Oriented x 3  Other:     ED Results / Procedures / Treatments  Labs (all labs ordered are listed, but only abnormal results are displayed) Labs Reviewed  COMPREHENSIVE METABOLIC PANEL - Abnormal; Notable for the following components:      Result Value   Glucose, Bld 110 (*)    GFR, Estimated 58 (*)    All other components within normal limits  URINALYSIS, ROUTINE W REFLEX MICROSCOPIC - Abnormal; Notable for the following components:   Color, Urine STRAW (*)    APPearance CLEAR (*)    Leukocytes,Ua SMALL (*)    Bacteria, UA RARE (*)    All other components within normal limits  CBC WITH DIFFERENTIAL/PLATELET  LIPASE, BLOOD     EKG     RADIOLOGY CT abdomen pelvis interpreted myself is negative for acute process   PROCEDURES:  Critical Care performed: No  Procedures {  MEDICATIONS ORDERED IN ED: Medications  iohexol (OMNIPAQUE) 300 MG/ML solution 100 mL (100 mLs Intravenous Contrast Given 08/14/21 2030)  acetaminophen (TYLENOL) tablet 1,000 mg (1,000 mg Oral  Given 08/14/21 2337)  alum & mag hydroxide-simeth (MAALOX/MYLANTA) 200-200-20 MG/5ML suspension 30 mL (30 mLs Oral Given 08/14/21 2337)     IMPRESSION / MDM / ASSESSMENT AND PLAN / ED COURSE  I reviewed the triage vital signs and the nursing notes.                              Patient's presentation is most consistent with acute presentation with potential threat to life or bodily function.  Differential diagnosis includes, but is not limited to, SBO, volvulus, constipation, appendicitis, pancreatitis  Patient is a 88 year old female presenting with lower abdominal pain x1 day.  Has had similar episodes in the past several weeks that resolved on her own it is not associate with nausea vomiting diarrhea fevers or urinary symptoms.  Patient is hypertensive but vitals are otherwise within normal limits.  At time my evaluation 6 hours into patient's ED visit she is feeling significantly improved and is hungry.  Abdomen is mildly tender in the bilateral lower quadrants but exam overall is benign.  CT abdomen pelvis ordered from triage is negative for acute surgical process in the abdomen.  Labs are also reassuring including no leukocytosis normal CMP and lipase UA 6-10 WBCs patient is not having urinary symptoms feel UTI is unlikely.  Patient given Tylenol and GI cocktail.  Ultimately she tolerated p.o.  Think with improving symptoms tolerating p.o. and being hungry with negative CT and reassuring labs she is appropriate for outpatient discharge and follow-up.       FINAL CLINICAL IMPRESSION(S) / ED DIAGNOSES   Final diagnoses:  Generalized abdominal pain     Rx / DC Orders   ED Discharge Orders     None        Note:  This document was prepared using Dragon voice recognition software and may include unintentional dictation errors.   Rada Hay, MD 08/15/21 Benancio Deeds

## 2021-08-21 ENCOUNTER — Other Ambulatory Visit: Payer: Self-pay | Admitting: Cardiovascular Disease

## 2021-09-06 ENCOUNTER — Ambulatory Visit: Payer: Self-pay

## 2021-09-06 DIAGNOSIS — R1030 Lower abdominal pain, unspecified: Secondary | ICD-10-CM

## 2021-09-06 NOTE — Telephone Encounter (Signed)
I was aware of this problem earlier in August when they sent a message to request HFU apt.  I have again reviewed her Hospital ED Visit from 08/14/21.  CT was unremarkable, except a stable known moderate hiatal hernia, gallstones, and diverticulosis of colon.  At this time, I do not have a clear diagnosis for her lower abdominal pain.  Her apt with me is not until next week for HFU.  I am willing to refer given the frequency of occurrences and her age.  I will place urgent referral to [facility] for further management.  Nobie Putnam, [facility] 09/06/2021, 3:41 PM

## 2021-09-06 NOTE — Telephone Encounter (Signed)
   Chief Complaint: Lower abdominal pain. Seen in ED. Asking for GI referral. Asking to be worked in sooner than 09/13/21. Symptoms: Pain comes and goes. Frequency: 3 weeks ago Pertinent Negatives: Patient denies  Disposition: '[]'$ ED /'[]'$ Urgent Care (no appt availability in office) / '[]'$ Appointment(In office/virtual)/ '[]'$  [facility]/ '[]'$ Home Care/ '[]'$ Refused Recommended Disposition /'[]'$  Mobile Bus/ '[x]'$  Follow-up with PCP Additional Notes: Please advise daughter.  Answer Assessment - Initial Assessment Questions 1. LOCATION: "Where does it hurt?"      Lower 2. RADIATION: "Does the pain shoot anywhere else?" (e.g., chest, back)     No 3. ONSET: "When did the pain begin?" (e.g., minutes, hours or days ago)      3 weeks ago 4. SUDDEN: "Gradual or sudden onset?"     Sudden 5. PATTERN "Does the pain come and go, or is it constant?"    - If it comes and goes: "How long does it last?" "Do you have pain now?"     (Note: Comes and goes means the pain is intermittent. It goes away completely between bouts.)    - If constant: "Is it getting better, staying the same, or getting worse?"      (Note: Constant means the pain never goes away completely; most serious pain is constant and gets worse.)      Comes and goes 6. SEVERITY: "How bad is the pain?"  (e.g., Scale 1-10; mild, moderate, or severe)    - MILD (1-3): Doesn't interfere with normal activities, abdomen soft and not tender to touch.     - MODERATE (4-7): Interferes with normal activities or awakens from sleep, abdomen tender to touch.     - SEVERE (8-10): Excruciating pain, doubled over, unable to do any normal activities.       Severe at times 7. RECURRENT SYMPTOM: "Have you ever had this type of stomach pain before?" If Yes, ask: "When was the last time?" and "What happened that time?"      No 8. CAUSE: "What do you think is causing the stomach pain?"     Unsure 9. RELIEVING/AGGRAVATING FACTORS: "What makes it better or  worse?" (e.g., antacids, bending or twisting motion, bowel movement)     No 10. OTHER SYMPTOMS: "Do you have any other symptoms?" (e.g., back pain, diarrhea, fever, urination pain, vomiting)       No 11. PREGNANCY: "Is there any chance you are pregnant?" "When was your last menstrual period?"       No  Protocols used: Abdominal Pain - [facility]

## 2021-09-12 DIAGNOSIS — K219 Gastro-esophageal reflux disease without esophagitis: Secondary | ICD-10-CM | POA: Diagnosis not present

## 2021-09-12 DIAGNOSIS — R1084 Generalized abdominal pain: Secondary | ICD-10-CM | POA: Diagnosis not present

## 2021-09-12 DIAGNOSIS — K449 Diaphragmatic hernia without obstruction or gangrene: Secondary | ICD-10-CM | POA: Diagnosis not present

## 2021-09-12 DIAGNOSIS — R109 Unspecified abdominal pain: Secondary | ICD-10-CM | POA: Diagnosis not present

## 2021-09-12 DIAGNOSIS — K5901 Slow transit constipation: Secondary | ICD-10-CM | POA: Diagnosis not present

## 2021-09-13 ENCOUNTER — Telehealth (INDEPENDENT_AMBULATORY_CARE_PROVIDER_SITE_OTHER): Payer: Medicare Other | Admitting: Family Medicine

## 2021-09-13 ENCOUNTER — Encounter: Payer: Self-pay | Admitting: Family Medicine

## 2021-09-13 VITALS — Ht 66.0 in | Wt 125.0 lb

## 2021-09-13 DIAGNOSIS — K5900 Constipation, unspecified: Secondary | ICD-10-CM

## 2021-09-13 DIAGNOSIS — R1030 Lower abdominal pain, unspecified: Secondary | ICD-10-CM

## 2021-09-13 NOTE — Patient Instructions (Addendum)
   Please schedule a Follow-up Appointment to: Return if symptoms worsen or fail to improve.  If you have any other questions or concerns, please feel free to call the office or send a message through MyChart. You may also schedule an earlier appointment if necessary.  Additionally, you may be receiving a survey about your experience at our office within a few days to 1 week by e-mail or mail. We value your feedback.  Harini Dearmond, DO [facility], [facility] 

## 2021-09-13 NOTE — Progress Notes (Signed)
Subjective:    Patient ID: Leslie Johnston, female    DOB: 05/09/1925, 88 y.o.   MRN: [id number]  Leslie Johnston is a 88 y.o. female presenting on 09/13/2021 for Abdominal Pain  Virtual / Telehealth Encounter - Video Visit via MyChart The purpose of this virtual visit is to provide medical care while limiting exposure to the novel coronavirus (COVID19) for both patient and office staff.  Consent was obtained for remote visit:  Yes.   Answered questions that patient had about telehealth interaction:  Yes.   I discussed the limitations, risks, security and privacy concerns of performing an evaluation and management service by video/telephone. I also discussed with the patient that there may be a patient responsible charge related to this service. The patient expressed understanding and agreed to proceed.  Patient Location: Home Provider Location: [facility] (Office)  Participants in virtual visit: - Patient: KIMBLERY DIOP, daughter [place] - CMA: Orinda Kenner, CMA - Provider: Dr Parks Ranger   HPI  Recurrent Chronic Abdominal Pain / Constipation  Recent ED visit 1 month ago, Hospital ED Visit from 08/14/21 at [facility]. Reviewed. She had imaging CT was unremarkable, except a stable known moderate hiatal hernia, gallstones, and diverticulosis of colon. She was discharged and recommended follow up, she was referred to [facility] and they saw her yesterday 09/12/21 evaluation determined likely constipation as cause, she had X-ray that was reassuring, and they discussed other causes of symptoms with sedentary reduced circulation to bowels contributing to symptoms and constipation. She was given recommendation of miralax half dose daily for maintenance. Now she has no symptoms and no abdominal pain feeling better. Has had some sporadic symptoms since ED. No new concerns today      09/13/2021    4:40 PM 03/24/2021    3:27 PM 09/12/2020    2:27 PM  Depression screen  PHQ 2/9  Decreased Interest 0 0 1  Down, Depressed, Hopeless '1 1 1  '$ PHQ - 2 Score '1 1 2  '$ Altered sleeping 0  0  Tired, decreased energy 1  1  Change in appetite 1  1  Feeling bad or failure about yourself  0  0  Trouble concentrating 3  3  Moving slowly or fidgety/restless 0  0  Suicidal thoughts 0  0  PHQ-9 Score 6  7  Difficult doing work/chores Not difficult at all  Extremely dIfficult    Social History   Tobacco Use   Smoking status: Never   Smokeless tobacco: Never  Vaping Use   Vaping Use: Never used  Substance Use Topics   Alcohol use: No    Alcohol/week: 0.0 standard drinks of alcohol   Drug use: No    Review of Systems Per HPI unless specifically indicated above     Objective:    Ht '5\' 6"'$  (1.676 m)   Wt 125 lb (56.7 kg)   BMI 20.18 kg/m   Wt Readings from Last 3 Encounters:  09/13/21 125 lb (56.7 kg)  08/14/21 125 lb (56.7 kg)  03/24/21 122 lb (55.3 kg)    Physical Exam  Note examination was completely remotely via video observation objective data only  Gen - well-appearing, no acute distress or apparent pain, comfortable HEENT - eyes appear clear without discharge or redness Heart/Lungs - cannot examine virtually - observed no evidence of coughing or labored breathing. Abd - cannot examine virtually  Skin - face visible today- no rash Neuro - awake, alert, oriented Psych -  not anxious appearing  I have personally reviewed the radiology report from 08/14/21 on CT Abd.  CLINICAL DATA:  Left lower quadrant abdominal pain.   EXAM: CT ABDOMEN AND PELVIS WITH CONTRAST   TECHNIQUE: Multidetector CT imaging of the abdomen and pelvis was performed using the standard protocol following bolus administration of intravenous contrast.   RADIATION DOSE REDUCTION: This exam was performed according to the departmental dose-optimization program which includes automated exposure control, adjustment of the mA and/or kV according to patient size and/or use of  iterative reconstruction technique.   CONTRAST:  168m OMNIPAQUE IOHEXOL 300 MG/ML  SOLN   COMPARISON:  CT abdomen and pelvis 05/18/2020   FINDINGS: Lower chest: There is a 7 mm nodular density in the right lung base which is unchanged.   Hepatobiliary: Gallstones are again noted. Evaluation of gallbladder is limited secondary to motion artifact. No definite biliary ductal dilatation. No focal liver lesions are seen.   Pancreas: Grossly within normal limits.   Spleen: Normal in size without focal abnormality.   Adrenals/Urinary Tract: Limited evaluation secondary to motion artifact. Bilateral renal cysts are unchanged measuring up to 3 cm on the right. There is no hydronephrosis. The adrenal glands and bladder are within normal limits.   Stomach/Bowel: No evidence of bowel wall thickening, distention, or inflammatory changes. The appendix is not seen. There is sigmoid colon diverticulosis. Moderate-sized hiatal hernia is again noted. Stomach is otherwise within normal limits.   Vascular/Lymphatic: Aortic atherosclerosis. No enlarged abdominal or pelvic lymph nodes.   Reproductive: Calcified uterine fibroid again noted measuring 1 cm. Adnexa are unremarkable.   Other: No abdominal wall hernia or abnormality. No abdominopelvic ascites.   Musculoskeletal: Vertebroplasty changes at T12 and L1 are unchanged. Mild compression deformities of L2 and L3 are unchanged. The bones are diffusely osteopenic.   IMPRESSION: 1. No acute localizing process in the abdomen or pelvis. 2. Stable moderate-sized hiatal hernia. 3. Cholelithiasis. 4. Colonic diverticulosis. 5. 3 cm right Bosniak 1 benign simple cyst. No follow-up imaging is recommended. JACR 2018 Feb; 264-273, Management of the Incidental RenalMass on CT, RadioGraphics 2021; 814-848, Bosniak Classification of Cystic Renal Masses, Version 2019. 6. Stable 7 mm right lower lobe nodule. Non-contrast chest CT at 6-12 months is  recommended. If the nodule is stable at time of repeat CT, then future CT at 18-24 months (from today's scan) is considered optional for low-risk patients, but is recommended for high-risk patients. This recommendation follows the consensus statement: Guidelines for Management of Incidental Pulmonary Nodules Detected on CT Images: From the Fleischner Society 2017; Radiology 2017; 284:228-243. 7. Aortic Atherosclerosis (ICD10-I70.0).     Electronically Signed   By: ARonney AstersM.D.   On: 08/14/2021 20:57   Results for orders placed or performed during the hospital encounter of 08/14/21  CBC with Differential  Result Value Ref Range   WBC 6.8 4.0 - 10.5 K/uL   RBC 4.82 3.87 - 5.11 MIL/uL   Hemoglobin 13.3 12.0 - 15.0 g/dL   HCT 42.8 36.0 - 46.0 %   MCV 88.8 80.0 - 100.0 fL   MCH 27.6 26.0 - 34.0 pg   MCHC 31.1 30.0 - 36.0 g/dL   RDW 13.2 11.5 - 15.5 %   Platelets 219 150 - 400 K/uL   nRBC 0.0 0.0 - 0.2 %   Neutrophils Relative % 75 %   Neutro Abs 5.0 1.7 - 7.7 K/uL   Lymphocytes Relative 18 %   Lymphs Abs 1.2 0.7 - 4.0 K/uL  Monocytes Relative 6 %   Monocytes Absolute 0.4 0.1 - 1.0 K/uL   Eosinophils Relative 1 %   Eosinophils Absolute 0.1 0.0 - 0.5 K/uL   Basophils Relative 0 %   Basophils Absolute 0.0 0.0 - 0.1 K/uL   Immature Granulocytes 0 %   Abs Immature Granulocytes 0.02 0.00 - 0.07 K/uL  Comprehensive metabolic panel  Result Value Ref Range   Sodium 138 135 - 145 mmol/L   Potassium 4.8 3.5 - 5.1 mmol/L   Chloride 106 98 - 111 mmol/L   CO2 25 22 - 32 mmol/L   Glucose, Bld 110 (H) 70 - 99 mg/dL   BUN 18 8 - 23 mg/dL   Creatinine, Ser 0.92 0.44 - 1.00 mg/dL   Calcium 10.0 8.9 - 10.3 mg/dL   Total Protein 7.3 6.5 - 8.1 g/dL   Albumin 4.2 3.5 - 5.0 g/dL   AST 19 15 - 41 U/L   ALT 11 0 - 44 U/L   Alkaline Phosphatase 56 38 - 126 U/L   Total Bilirubin 0.6 0.3 - 1.2 mg/dL   GFR, Estimated 58 (L) >60 mL/min   Anion gap 7 5 - 15  Lipase, blood  Result Value  Ref Range   Lipase 33 11 - 51 U/L  Urinalysis, Routine w reflex microscopic  Result Value Ref Range   Color, Urine STRAW (A) YELLOW   APPearance CLEAR (A) CLEAR   Specific Gravity, Urine 1.005 1.005 - 1.030   pH 5.0 5.0 - 8.0   Glucose, UA NEGATIVE NEGATIVE mg/dL   Hgb urine dipstick NEGATIVE NEGATIVE   Bilirubin Urine NEGATIVE NEGATIVE   Ketones, ur NEGATIVE NEGATIVE mg/dL   Protein, ur NEGATIVE NEGATIVE mg/dL   Nitrite NEGATIVE NEGATIVE   Leukocytes,Ua SMALL (A) NEGATIVE   RBC / HPF 0-5 0 - 5 RBC/hpf   WBC, UA 6-10 0 - 5 WBC/hpf   Bacteria, UA RARE (A) NONE SEEN   Squamous Epithelial / LPF 0-5 0 - 5   Mucus PRESENT       Assessment & Plan:   Problem List Items Addressed This Visit   None Visit Diagnoses     Recurrent lower abdominal pain    -  Primary   Constipation, unspecified constipation type           Reassurance Reviewed ED Visit and GI consultation note from [facility] 09/12/21 and prior imaging. Agree with nutritional diet and lifestyle changes Continue low dose Miralax maintenance half cap dosing daily Follow up as needed  No orders of the defined types were placed in this encounter.     Follow up plan: Return if symptoms worsen or fail to improve.   Patient verbalizes understanding with the above medical recommendations including the limitation of remote medical advice.  Specific follow-up and call-back criteria were given for patient to follow-up or seek medical care more urgently if needed.  Total duration of direct patient care provided via video conference: 10 minutes   Nobie Putnam, [facility] 09/13/2021, 4:32 PM

## 2021-10-12 ENCOUNTER — Other Ambulatory Visit: Payer: Medicare Other | Admitting: Student

## 2021-10-12 DIAGNOSIS — Z515 Encounter for palliative care: Secondary | ICD-10-CM | POA: Diagnosis not present

## 2021-10-12 DIAGNOSIS — F03B0 Unspecified dementia, moderate, without behavioral disturbance, psychotic disturbance, mood disturbance, and anxiety: Secondary | ICD-10-CM

## 2021-10-12 DIAGNOSIS — R062 Wheezing: Secondary | ICD-10-CM

## 2021-10-12 DIAGNOSIS — R1030 Lower abdominal pain, unspecified: Secondary | ICD-10-CM | POA: Diagnosis not present

## 2021-10-12 DIAGNOSIS — R630 Anorexia: Secondary | ICD-10-CM | POA: Diagnosis not present

## 2021-10-12 NOTE — Progress Notes (Signed)
Therapist, nutritional Palliative Care Consult Note Telephone: [phone]  Fax: [phone]    Date of encounter: 10/12/21 3:07 PM PATIENT NAME: Leslie Johnston [address] [place] [address]   [phone] (home)  DOB: [birth year] MRN: [id number] PRIMARY CARE PROVIDER:    Smitty Cords, DO,  [address] [place] [address] [phone]  REFERRING PROVIDER:   Smitty Cords, DO [address] [place],  [place] [address] [phone]  RESPONSIBLE PARTY:    Contact Information     Name Relation Home Work Mobile   Leslie Johnston Daughter   [phone]   Darreld Mclean Daughter   [phone]        I met face to face with patient and family in the home. Palliative Care was asked to follow this patient by consultation request of  Saralyn Pilar * to address advance care planning and complex medical decision making. This is a follow up visit.                                   ASSESSMENT AND PLAN / RECOMMENDATIONS:   Advance Care Planning/Goals of Care: Goals include to maximize quality of life and symptom management. Patient/health care surrogate gave his/her permission to discuss. CODE STATUS: DNR  Symptom Management/Plan:  Dementia- patient has been stable; no further cognitive decline. Family to continue reorienting and redirecting as needed. Maintain a routine schedule. Monitor for falls/safety. Supportive care from family.   Abdominal pain- seen in ED on 8/14; CT scan negative for any acute processes. Patient started on Miralax QOD. No further episodes reported. Encourage adequate fluids and fiber to promote regular bowel movements.   Appetite-appetite has been fair overall. Continue foods patient enjoys, nutritional supplements. Continue mirtazapine 7.5 mg QHS. Weight has been stable.   Wheeze- patient with inspiratory wheeze; continue albuterol 1-2 puffs every 6 hours PRN.   Follow up Palliative Care Visit: Palliative  care will continue to follow for complex medical decision making, advance care planning, and clarification of goals. Return prn.   This visit was coded based on medical decision making (MDM).  PPS: 40%  HOSPICE ELIGIBILITY/DIAGNOSIS: TBD  Chief Complaint: Palliative Medicine follow up visit.   HISTORY OF PRESENT ILLNESS:  Leslie Johnston is a 88 y.o. year old female  with dementia, osteoporosis without current pathological fracture, chronic lower back pain, hypertension, CKD 3, atrial fibrillation, allergic rhinitis, GERD, overactive bladder. Hx of compression fracture to thoracic spine, hx of pathological fracture of humerus.     Patient has been doing well. She denies pain, shortness of breath. Her appetite has been fair; weight is stable. She is sleeping well. No recent falls or injury. She did have an ED visit on 08/14/21 due to abdominal pain. She denies further episodes. She was started on miralax every other day. Patient received in her living room. She has pleasant affect. She does repeat herself throughout visit; engages in conversation. HPI and ROS gathered from both patient and family due to impaired cognition.   History obtained from review of EMR, discussion with primary team, and interview with family, facility staff/caregiver and/or Leslie Johnston.  I reviewed available labs, medications, imaging, studies and related documents from the EMR.  Records reviewed and summarized above.   Physical Exam: Weight: 124. 8 pounds Pulse 76, resp 18, b/p 110/64, sats 96% on room air Constitutional: NAD General: frail appearing, thin EYES: anicteric sclera, lids  intact, no discharge  ENMT: intact hearing, oral mucous membranes moist CV: S1S2, RRR, no LE edema Pulmonary: faint inspiratory wheeze,  no increased work of breathing, no cough, room air Abdomen:  normo-active BS + 4 quadrants, soft and non tender, no ascites GU: deferred MSK: moves all extremities, ambulatory Skin: warm and dry,  no rashes or wounds on visible skin Neuro:  no generalized weakness,  + cognitive impairment Psych: non-anxious affect, A and O x 2 Hem/lymph/immuno: no widespread bruising   Thank you for the opportunity to participate in the care of Leslie Johnston.  The palliative care team will continue to follow. Please call our office at [phone] if we can be of additional assistance.   Ezekiel Slocumb, NP   COVID-19 PATIENT SCREENING TOOL Asked and negative response unless otherwise noted:   Have you had symptoms of covid, tested positive or been in contact with someone with symptoms/positive test in the past 5-10 days? No

## 2021-10-26 ENCOUNTER — Other Ambulatory Visit: Payer: Self-pay | Admitting: Medical

## 2021-10-27 NOTE — Telephone Encounter (Signed)
Prescription refill request for Eliquis received.  Indication: afib  Last office visit: [facility], 11/23/2020 Scr: 0.92, 08/14/2021 Age: 88 yo Weight: 56.7 kg   Pt is scheduled to see Dr. Rockey Situ on 12/01/2021

## 2021-11-04 ENCOUNTER — Other Ambulatory Visit: Payer: Self-pay | Admitting: Family Medicine

## 2021-11-04 DIAGNOSIS — R630 Anorexia: Secondary | ICD-10-CM

## 2021-11-21 ENCOUNTER — Other Ambulatory Visit: Payer: Self-pay | Admitting: Family Medicine

## 2021-11-21 NOTE — Telephone Encounter (Signed)
Requested medications are due for refill today.  Unsure  Requested medications are on the active medications list.  yes  Last refill. 11/23/2020  Future visit scheduled.   no  Notes to clinic.  Reported 03/24/2021 pt not taking. Rx signed by Ida Rogue.    Requested Prescriptions  Pending Prescriptions Disp Refills   albuterol (VENTOLIN HFA) 108 (90 Base) MCG/ACT inhaler [Pharmacy Med Name: ALBUTEROL HFA INH (200 PUFFS) 6.7GM] 6.7 g     Sig: INHALE 1 TO 2 PUFFS INTO THE LUNGS EVERY 6 HOURS AS NEEDED FOR WHEEZING OR SHORTNESS OF BREATH     There is no refill protocol information for this order

## 2021-11-28 NOTE — Progress Notes (Signed)
Cardiology Office Note  Date:  12/01/2021   ID:  Leslie Johnston, DOB [birth year], MRN [id number]  PCP:  Olin Hauser, DO   Chief Complaint  Patient presents with   12 month follow up     "Doing well." Medications reviewed by the patient verbally.     HPI:  Leslie Johnston is a 88 y.o. female with a hx of  HTN, overactive bladder, osteoporosis,  dementia,  Afib  who presents for follow-up for afib.   Last seen in clinic April 2022 Presents today with family Continues to live independently but family rotates through living with her to maintain she is safe at home Presents today in a wheelchair Some gait stability but no recent falls  Seen in the emergency room May 2022 abdominal and back pain  Seen in the emergency room August 2023 for abdominal pain Blood pressure elevated on that visit 762 systolic CT scan no acute findings  Lots of TV , sedentary, sitting for much of the day No regular exercise program  Good appetite, weight stable Hard of hearing, family reports some confusion at times  Denies any tachypalpitations concerning for arrhythmia Initial blood pressure elevated, improved on recheck  EKG personally reviewed by myself on todays visit Normal sinus rhythm with rate 72 bpm left axis deviation, no significant ST-T wave changes  Other past medical history reviewed  admitted 2/9-2/11 with chest pain and abdominal pain.   found to be in  onset Afib with rates up into the 170s.   Labs were unremarkable.  Patient converted to SR with IV diltiazem.   Echo showed LVEF 60-65%, no WMA, G1DD, normal RV function.   Diltiazem was consolidated to '120mg'$  daily.   started on Eliquis 2.'5mg'$  BID (age and weight)    PMH:   has a past medical history of A-fib ([facility]), Allergic rhinitis, Allergy, Chronic low back pain, GERD (gastroesophageal reflux disease), and Hypertension.  PSH:    Past Surgical History:  Procedure Laterality Date   EYE SURGERY      cataract extraction- Right   KYPHOPLASTY N/A 11/26/2017   Procedure: [id number],L3;  Surgeon: Hessie Knows, MD;  Location: [facility] ORS;  Service: Orthopedics;  Laterality: N/A;    Current Outpatient Medications  Medication Sig Dispense Refill   acetaminophen (TYLENOL) 650 MG CR tablet Take 650 mg by mouth every 8 (eight) hours as needed for pain.     albuterol (VENTOLIN HFA) 108 (90 Base) MCG/ACT inhaler INHALE 1 TO 2 PUFFS INTO THE LUNGS EVERY 6 HOURS AS NEEDED FOR WHEEZING OR SHORTNESS OF BREATH 6.7 g 2   apixaban (ELIQUIS) 2.5 MG TABS tablet TAKE 1 TABLET(2.5 MG) BY MOUTH TWICE DAILY 180 tablet 0   diltiazem (CARDIZEM CD) 180 MG 24 hr capsule TAKE 1 CAPSULE(180 MG) BY MOUTH DAILY 90 capsule 3   lisinopril (ZESTRIL) 20 MG tablet TAKE 1 TABLET(20 MG) BY MOUTH DAILY 90 tablet 3   loratadine (CLARITIN) 10 MG tablet Take 10 mg by mouth daily.     mirtazapine (REMERON) 7.5 MG tablet TAKE 1 TABLET(7.5 MG) BY MOUTH AT BEDTIME 90 tablet 0   Multiple Vitamin (MULTIVITAMIN WITH MINERALS) TABS tablet Take 1 tablet by mouth daily with lunch.      Multiple Vitamins-Minerals (PRESERVISION AREDS 2+MULTI VIT PO) Take by mouth.     pantoprazole (PROTONIX) 40 MG tablet TAKE 1 TABLET BY MOUTH DAILY BEFORE BREAKFAST 90 tablet 3   No current facility-administered medications for this visit.    Allergies:  Amlodipine, Losartan, Penicillins, and Tramadol   Social History:  The patient  reports that she has never smoked. She has never used smokeless tobacco. She reports that she does not drink alcohol and does not use drugs.   Family History:   family history includes Arthritis in her maternal grandmother; Asthma in her maternal grandfather; Bladder Cancer in her daughter; Breast cancer in her daughter; Leukemia in her maternal grandfather; Pancreatic cancer in her sister.    Review of Systems: Review of Systems  Constitutional: Negative.   HENT: Negative.    Respiratory: Negative.    Cardiovascular:  Negative.   Gastrointestinal: Negative.   Musculoskeletal: Negative.   Neurological: Negative.   Psychiatric/Behavioral: Negative.    All other systems reviewed and are negative.   PHYSICAL EXAM: VS:  BP (!) 150/60 (BP Location: Left Arm, Patient Position: Sitting, Cuff Size: Normal)   Pulse 72   Ht '5\' 4"'$  (1.626 m)   Wt 126 lb (57.2 kg)   BMI 21.63 kg/m  , BMI Body mass index is 21.63 kg/m. Constitutional:  oriented to person, place, and time. No distress.  HENT:  Head: Grossly normal Eyes:  no discharge. No scleral icterus.  Neck: No JVD, no carotid bruits  Cardiovascular: Regular rate and rhythm, no murmurs appreciated Pulmonary/Chest: Clear to auscultation bilaterally,  Wheezing appreciated anteriorly and posteriorly Abdominal: Soft.  no distension.  no tenderness.  Musculoskeletal: Normal range of motion Neurological:  normal muscle tone. Coordination normal. No atrophy Skin: Skin warm and dry Psychiatric: normal affect, pleasant   Recent Labs: 08/14/2021: ALT 11; BUN 18; Creatinine, Ser 0.92; Hemoglobin 13.3; Platelets 219; Potassium 4.8; Sodium 138    Lipid Panel Lab Results  Component Value Date   CHOL 142 02/12/2020   HDL 45 02/12/2020   LDLCALC 81 02/12/2020   TRIG 79 02/12/2020      Wt Readings from Last 3 Encounters:  12/01/21 126 lb (57.2 kg)  09/13/21 125 lb (56.7 kg)  08/14/21 125 lb (56.7 kg)      ASSESSMENT AND PLAN:  Problem List Items Addressed This Visit       Cardiology Problems   Atherosclerosis of aorta ([place])   Other Visit Diagnoses     Paroxysmal A-fib ([place])    -  Primary   Essential hypertension       Stage 3 chronic kidney disease, unspecified whether stage 3a or 3b CKD (H[facility])         Atrial fib, paroxysmal Maintaining normal sinus rhythm,  with Eliquis 2.5 twice daily based on age and weight less than 60 kg Recommend we continue diltiazem ER 180 daily  Essential hypertension Blood pressure is well controlled on today's  visit. No changes made to the medications.  Abdominal pain Stable, none recently     Total encounter time more than 30 minutes  Greater than 50% was spent in counseling and coordination of care with the patient    Signed, Esmond Plants, M.D., Ph.D. [place], [place]

## 2021-12-01 ENCOUNTER — Ambulatory Visit: Payer: Medicare Other | Attending: Cardiovascular Disease | Admitting: Cardiovascular Disease

## 2021-12-01 ENCOUNTER — Encounter: Payer: Self-pay | Admitting: Cardiovascular Disease

## 2021-12-01 VITALS — BP 135/80 | HR 72 | Ht 64.0 in | Wt 126.0 lb

## 2021-12-01 DIAGNOSIS — I700 Atherosclerosis of aorta: Secondary | ICD-10-CM

## 2021-12-01 DIAGNOSIS — I480 Paroxysmal atrial fibrillation: Secondary | ICD-10-CM | POA: Diagnosis not present

## 2021-12-01 DIAGNOSIS — I10 Essential (primary) hypertension: Secondary | ICD-10-CM | POA: Diagnosis not present

## 2021-12-01 DIAGNOSIS — N183 Chronic kidney disease, stage 3 unspecified: Secondary | ICD-10-CM

## 2021-12-01 NOTE — Patient Instructions (Signed)
Medication Instructions:  No changes  If you need a refill on your cardiac medications before your next appointment, please call your pharmacy.   Lab work: No new labs needed  Testing/Procedures: No new testing needed  Follow-Up: At [facility], you and your health needs are our priority.  As part of our continuing mission to provide you with exceptional heart care, we have created designated Provider Care Teams.  These Care Teams include your primary Cardiologist (physician) and Advanced Practice Providers (APPs -  Physician Assistants and Nurse Practitioners) who all work together to provide you with the care you need, when you need it.  You will need a follow up appointment in 12 months  Providers on your designated Care Team:   Christopher Berge, NP Ryan Dunn, PA-C Cadence Furth, PA-C  COVID-19 Vaccine Information can be found at: [email or web address] For questions related to vaccine distribution or appointments, please email [email or web address] or call [phone].   

## 2022-01-21 ENCOUNTER — Other Ambulatory Visit: Payer: Self-pay | Admitting: Medical

## 2022-01-22 ENCOUNTER — Telehealth: Payer: Self-pay | Admitting: Cardiovascular Disease

## 2022-01-22 MED ORDER — METOPROLOL TARTRATE 25 MG PO TABS: ORAL_TABLET | ORAL | 1 refills | Status: DC

## 2022-01-22 NOTE — Telephone Encounter (Signed)
I spoke with the patient's daughter, [name] (ok per DPR).  She advised that the patient has had a cough at night for the last several days. She is taking Coricidin HBP.  They are checking her Oxygen levels due to the cough and have noticed heart rates of 80-140's over the last week.  O2 sats run "low" at times.  HR today has been 92-124 bpm. BP has been 145/85-127/79.  Per [place], there is no visible lower extremity edema. The patient will c/o some SOB at night before bed, but her O2 sats are noted to be normal at that time.  I have advised [name] that the patient does appear to be in A-fib at this time. Confirmed with [name] that the patient is still taking Diltiazem 180 mg once daily & Eliquis 2.5 mg BID.   She is aware I will review with Dr. Rockey Situ and call her back.

## 2022-01-22 NOTE — Telephone Encounter (Signed)
STAT if HR is under 50 or over 120 (normal HR is 60-100 beats per minute)  What is your heart rate? Up and down from 80-140 for bout a week now.   Do you have a log of your heart rate readings (document readings)?    Do you have any other symptoms?  Nothing outside of the normal

## 2022-01-22 NOTE — Telephone Encounter (Signed)
Prescription refill request for Eliquis received.  Indication: afib  Last office visit: 12/01/2021, [facility]  Scr: 0.92, 08/14/2021 Age: 89 yo  Weight: 57.2 kg   Refill sent.

## 2022-01-22 NOTE — Telephone Encounter (Signed)
Reviewed the patient's symptoms with Dr. Rockey Situ. Orders received to: 1) START Metoprolol tartrate 25 mg BID 2) Continue to monitor HR/ BP at home 3) Call the office if HR are below 60 bpm or remain elevated after 1 week/ or if the patient develops lower extremity edema/ increased SOB.  I have spoken with [facility] regarding the above MD recommendations. She voices understanding and is agreeable.  Per [name]- send RX for metoprolol to [name] in [place].

## 2022-01-23 NOTE — Telephone Encounter (Signed)
Daughter calling back to say the patient feet is swollen. Please advise

## 2022-01-23 NOTE — Telephone Encounter (Signed)
Pt's daughter is returning call to see if there are any updates. Requesting return call.

## 2022-01-23 NOTE — Telephone Encounter (Signed)
Pt daughter called stating this morning they've notice bilateral feet swelling She reported pt does have some SOB, but isn't new. No significant weight increase. However she reported pt weighed 126 before Christmas and now 130. Daughter stated they will try leg elevations today but wanted to reach out for any further recommendations.  Will forward to MD

## 2022-01-23 NOTE — Telephone Encounter (Signed)
I spoke with the patient's daughter, [name] (ok per DPR).  She called back to inquire if any further recommendations have been received from Dr. Rockey Situ per the previous message from earlier today.   I advised Dee that Dr. Rockey Situ has just finished in clinic to day and that he has not had a chance to review his messages.  I inquired if the patient's symptoms have worsened since this morning.  Per Scott, the patient's HR"s are still variable with readings up to 120-125 bpm. She developed some swelling this morning, which she did not have previously, but this has improved some as the day has gone on.  The patient was started on metoprolol tartrate 25 mg BID yesterday (she has only had 2 doses).  I have advised Dee to continue metoprolol at this time. Will forward to Dr. Rockey Situ for any additional feedback.  [name] is aware to call in the next couple of days if symptoms worsen or she feels the patient needs to be seen. She voices understanding and is agreeable.

## 2022-01-24 ENCOUNTER — Encounter: Payer: Self-pay | Admitting: *Deleted

## 2022-01-24 ENCOUNTER — Other Ambulatory Visit: Payer: Self-pay

## 2022-01-24 ENCOUNTER — Emergency Department: Payer: Medicare Other

## 2022-01-24 ENCOUNTER — Inpatient Hospital Stay
Admission: EM | Admit: 2022-01-24 | Discharge: 2022-01-29 | DRG: 291 | Disposition: A | Discharge status: Home Health | Payer: Medicare Other | Attending: Internal Medicine | Admitting: Internal Medicine

## 2022-01-24 DIAGNOSIS — I5031 Acute diastolic (congestive) heart failure: Secondary | ICD-10-CM | POA: Diagnosis present

## 2022-01-24 DIAGNOSIS — Z803 Family history of malignant neoplasm of breast: Secondary | ICD-10-CM

## 2022-01-24 DIAGNOSIS — R54 Age-related physical debility: Secondary | ICD-10-CM | POA: Diagnosis present

## 2022-01-24 DIAGNOSIS — K219 Gastro-esophageal reflux disease without esophagitis: Secondary | ICD-10-CM | POA: Diagnosis present

## 2022-01-24 DIAGNOSIS — Z8052 Family history of malignant neoplasm of bladder: Secondary | ICD-10-CM

## 2022-01-24 DIAGNOSIS — Z7901 Long term (current) use of anticoagulants: Secondary | ICD-10-CM

## 2022-01-24 DIAGNOSIS — Z825 Family history of asthma and other chronic lower respiratory diseases: Secondary | ICD-10-CM

## 2022-01-24 DIAGNOSIS — N183 Chronic kidney disease, stage 3 unspecified: Secondary | ICD-10-CM | POA: Diagnosis present

## 2022-01-24 DIAGNOSIS — Z800 Family history of malignant neoplasm of digestive organs: Secondary | ICD-10-CM | POA: Diagnosis not present

## 2022-01-24 DIAGNOSIS — I5033 Acute on chronic diastolic (congestive) heart failure: Secondary | ICD-10-CM | POA: Diagnosis present

## 2022-01-24 DIAGNOSIS — F0390 Unspecified dementia without behavioral disturbance: Secondary | ICD-10-CM | POA: Diagnosis present

## 2022-01-24 DIAGNOSIS — M810 Age-related osteoporosis without current pathological fracture: Secondary | ICD-10-CM | POA: Diagnosis present

## 2022-01-24 DIAGNOSIS — I110 Hypertensive heart disease with heart failure: Secondary | ICD-10-CM | POA: Diagnosis not present

## 2022-01-24 DIAGNOSIS — E871 Hypo-osmolality and hyponatremia: Secondary | ICD-10-CM | POA: Diagnosis present

## 2022-01-24 DIAGNOSIS — R0600 Dyspnea, unspecified: Secondary | ICD-10-CM | POA: Diagnosis not present

## 2022-01-24 DIAGNOSIS — I10 Essential (primary) hypertension: Secondary | ICD-10-CM | POA: Diagnosis not present

## 2022-01-24 DIAGNOSIS — I4891 Unspecified atrial fibrillation: Secondary | ICD-10-CM | POA: Diagnosis not present

## 2022-01-24 DIAGNOSIS — J90 Pleural effusion, not elsewhere classified: Secondary | ICD-10-CM | POA: Diagnosis not present

## 2022-01-24 DIAGNOSIS — J9601 Acute respiratory failure with hypoxia: Secondary | ICD-10-CM | POA: Diagnosis present

## 2022-01-24 DIAGNOSIS — I130 Hypertensive heart and chronic kidney disease with heart failure and stage 1 through stage 4 chronic kidney disease, or unspecified chronic kidney disease: Principal | ICD-10-CM | POA: Diagnosis present

## 2022-01-24 DIAGNOSIS — Z806 Family history of leukemia: Secondary | ICD-10-CM

## 2022-01-24 DIAGNOSIS — N3281 Overactive bladder: Secondary | ICD-10-CM | POA: Diagnosis present

## 2022-01-24 DIAGNOSIS — I252 Old myocardial infarction: Secondary | ICD-10-CM | POA: Diagnosis not present

## 2022-01-24 DIAGNOSIS — Z79899 Other long term (current) drug therapy: Secondary | ICD-10-CM

## 2022-01-24 DIAGNOSIS — I4819 Other persistent atrial fibrillation: Secondary | ICD-10-CM | POA: Diagnosis not present

## 2022-01-24 DIAGNOSIS — Z66 Do not resuscitate: Secondary | ICD-10-CM | POA: Diagnosis present

## 2022-01-24 DIAGNOSIS — I5023 Acute on chronic systolic (congestive) heart failure: Secondary | ICD-10-CM | POA: Diagnosis not present

## 2022-01-24 DIAGNOSIS — R079 Chest pain, unspecified: Secondary | ICD-10-CM | POA: Diagnosis not present

## 2022-01-24 DIAGNOSIS — I509 Heart failure, unspecified: Principal | ICD-10-CM

## 2022-01-24 LAB — BRAIN NATRIURETIC PEPTIDE: B Natriuretic Peptide: 419.8 pg/mL — ABNORMAL HIGH (ref 0.0–100.0)

## 2022-01-24 LAB — CBC
HCT: 36.9 % (ref 36.0–46.0)
Hemoglobin: 11.4 g/dL — ABNORMAL LOW (ref 12.0–15.0)
MCH: 27.3 pg (ref 26.0–34.0)
MCHC: 30.9 g/dL (ref 30.0–36.0)
MCV: 88.3 fL (ref 80.0–100.0)
Platelets: 258 10*3/uL (ref 150–400)
RBC: 4.18 MIL/uL (ref 3.87–5.11)
RDW: 13.2 % (ref 11.5–15.5)
WBC: 8.6 10*3/uL (ref 4.0–10.5)
nRBC: 0 % (ref 0.0–0.2)

## 2022-01-24 LAB — BASIC METABOLIC PANEL
Anion gap: 10 (ref 5–15)
BUN: 20 mg/dL (ref 8–23)
CO2: 19 mmol/L — ABNORMAL LOW (ref 22–32)
Calcium: 9.4 mg/dL (ref 8.9–10.3)
Chloride: 99 mmol/L (ref 98–111)
Creatinine, Ser: 0.86 mg/dL (ref 0.44–1.00)
GFR, Estimated: >60 mL/min (ref >60)
Glucose, Bld: 122 mg/dL — ABNORMAL HIGH (ref 70–99)
Potassium: 4.4 mmol/L (ref 3.5–5.1)
Sodium: 128 mmol/L — ABNORMAL LOW (ref 135–145)

## 2022-01-24 LAB — TROPONIN I (HIGH SENSITIVITY): Troponin I (High Sensitivity): 9 ng/L (ref <18)

## 2022-01-24 MED ORDER — MAGNESIUM HYDROXIDE 400 MG/5ML PO SUSP: 30.0000 mL | Freq: Every day | ORAL | Status: DC | PRN

## 2022-01-24 MED ORDER — TRAZODONE HCL 50 MG PO TABS
25.0000 mg | ORAL_TABLET | Freq: Every evening | ORAL | Status: DC | PRN
  Administered 2022-01-27: 25 mg via ORAL
  Filled 2022-01-24 (×2): qty 1

## 2022-01-24 MED ORDER — ONDANSETRON HCL 4 MG PO TABS: 4.0000 mg | ORAL_TABLET | Freq: Four times a day (QID) | ORAL | Status: DC | PRN

## 2022-01-24 MED ORDER — DILTIAZEM HCL 25 MG/5ML IV SOLN
10.0000 mg | Freq: Once | INTRAVENOUS | Status: AC
  Administered 2022-01-24: 10 mg via INTRAVENOUS
  Filled 2022-01-24: qty 5

## 2022-01-24 MED ORDER — APIXABAN 2.5 MG PO TABS
2.5000 mg | ORAL_TABLET | Freq: Two times a day (BID) | ORAL | Status: DC
  Administered 2022-01-25 – 2022-01-29 (×9): 2.5 mg via ORAL
  Filled 2022-01-24 (×10): qty 1

## 2022-01-24 MED ORDER — ACETAMINOPHEN 325 MG PO TABS
650.0000 mg | ORAL_TABLET | Freq: Four times a day (QID) | ORAL | Status: DC | PRN
  Administered 2022-01-24 – 2022-01-28 (×7): 650 mg via ORAL
  Filled 2022-01-24 (×8): qty 2

## 2022-01-24 MED ORDER — DILTIAZEM HCL ER COATED BEADS 180 MG PO CP24
180.0000 mg | ORAL_CAPSULE | Freq: Every day | ORAL | Status: DC
  Administered 2022-01-25 – 2022-01-29 (×5): 180 mg via ORAL
  Filled 2022-01-24 (×5): qty 1

## 2022-01-24 MED ORDER — ONDANSETRON HCL 4 MG/2ML IJ SOLN: 4.0000 mg | Freq: Four times a day (QID) | INTRAMUSCULAR | Status: DC | PRN

## 2022-01-24 MED ORDER — FUROSEMIDE 10 MG/ML IJ SOLN
40.0000 mg | Freq: Once | INTRAMUSCULAR | Status: AC
  Administered 2022-01-24: 40 mg via INTRAVENOUS
  Filled 2022-01-24: qty 4

## 2022-01-24 MED ORDER — METOPROLOL TARTRATE 25 MG PO TABS
25.0000 mg | ORAL_TABLET | Freq: Two times a day (BID) | ORAL | Status: DC
  Administered 2022-01-25 – 2022-01-26 (×4): 25 mg via ORAL
  Filled 2022-01-24 (×4): qty 1

## 2022-01-24 MED ORDER — ACETAMINOPHEN 650 MG RE SUPP: 650.0000 mg | Freq: Four times a day (QID) | RECTAL | Status: DC | PRN

## 2022-01-24 MED ORDER — FUROSEMIDE 10 MG/ML IJ SOLN
20.0000 mg | Freq: Once | INTRAMUSCULAR | Status: AC
  Administered 2022-01-24: 20 mg via INTRAVENOUS
  Filled 2022-01-24: qty 4

## 2022-01-24 MED ORDER — ALBUTEROL SULFATE HFA 108 (90 BASE) MCG/ACT IN AERS: 2.0000 | INHALATION_SPRAY | RESPIRATORY_TRACT | Status: DC | PRN

## 2022-01-24 MED ORDER — ENOXAPARIN SODIUM 40 MG/0.4ML IJ SOSY: 40.0000 mg | PREFILLED_SYRINGE | INTRAMUSCULAR | Status: DC

## 2022-01-24 MED ORDER — FUROSEMIDE 10 MG/ML IJ SOLN
40.0000 mg | Freq: Two times a day (BID) | INTRAMUSCULAR | Status: DC
  Administered 2022-01-25: 40 mg via INTRAVENOUS
  Filled 2022-01-24: qty 4

## 2022-01-24 MED ORDER — ALBUTEROL SULFATE (2.5 MG/3ML) 0.083% IN NEBU: 2.5000 mg | INHALATION_SOLUTION | RESPIRATORY_TRACT | Status: DC | PRN

## 2022-01-24 MED ORDER — MIRTAZAPINE 15 MG PO TABS
7.5000 mg | ORAL_TABLET | Freq: Every day | ORAL | Status: DC
  Administered 2022-01-24 – 2022-01-28 (×5): 7.5 mg via ORAL
  Filled 2022-01-24 (×5): qty 1

## 2022-01-24 MED ORDER — ADULT MULTIVITAMIN W/MINERALS CH
1.0000 | ORAL_TABLET | Freq: Every day | ORAL | Status: DC
  Administered 2022-01-25 – 2022-01-29 (×5): 1 via ORAL
  Filled 2022-01-24 (×5): qty 1

## 2022-01-24 MED ORDER — LORATADINE 10 MG PO TABS: 10.0000 mg | ORAL_TABLET | Freq: Every day | ORAL | Status: DC

## 2022-01-24 MED ORDER — PANTOPRAZOLE SODIUM 40 MG PO TBEC
40.0000 mg | DELAYED_RELEASE_TABLET | Freq: Every day | ORAL | Status: DC
  Administered 2022-01-25 – 2022-01-29 (×5): 40 mg via ORAL
  Filled 2022-01-24 (×6): qty 1

## 2022-01-24 NOTE — ED Triage Notes (Addendum)
Pt brought in by daughter.  Pt has sob and afib.  Pt also has chest pain.  No n/v/   sx for 3-4 days, worse today.   pt alert  , pt is hard of hearing.    Pt placed on 2 liters oxygen in triage and taken to room 18.

## 2022-01-24 NOTE — ED Provider Notes (Signed)
[facility] Provider Note    Event Date/Time   First MD Initiated Contact with Patient 01/24/22 2002     (approximate)   History   Shortness of Breath   HPI  Leslie Johnston is a 89 y.o. female  here with SOB. Pt reports that over the past several days, she has had progressively worsening SOB, leg swelling. Today, pt's sx acutely worsened and she has had significant increased WOB. She had some chest pressure today as well. Denies any recent changes in health, no fevers, no sputum production. No abd pain. No n/v/d.       Physical Exam   Triage Vital Signs: ED Triage Vitals  Enc Vitals Group     BP 01/24/22 1937 (!) 153/105     Pulse Rate 01/24/22 1937 (!) 109     Resp 01/24/22 1937 20     Temp --      Temp src --      SpO2 01/24/22 1937 93 %     Weight 01/24/22 1934 125 lb 10.6 oz (57 kg)     Height 01/24/22 1934 '5\' 4"'$  (1.626 m)     Head Circumference --      Peak Flow --      Pain Score 01/24/22 1934 5     Pain Loc --      Pain Edu? --      Excl. in [place]? --     Most recent vital signs: Vitals:   01/24/22 2041 01/24/22 2124  BP: (!) 144/105 (!) 154/94  Pulse: (!) 108 96  Resp: (!) 26   SpO2: 92%      General: Awake, no distress.  CV:  Good peripheral perfusion. Regular rate and rhythm.  Resp:  Tachypnea noted with bibasilar rales.  Abd:  No distention. No tenderness. Other:  2+ edema bl Le.   ED Results / Procedures / Treatments   Labs (all labs ordered are listed, but only abnormal results are displayed) Labs Reviewed  BASIC METABOLIC PANEL - Abnormal; Notable for the following components:      Result Value   Sodium 128 (*)    CO2 19 (*)    Glucose, Bld 122 (*)    All other components within normal limits  CBC - Abnormal; Notable for the following components:   Hemoglobin 11.4 (*)    All other components within normal limits  BRAIN NATRIURETIC PEPTIDE - Abnormal; Notable for the following components:   B Natriuretic  Peptide 419.8 (*)    All other components within normal limits  BASIC METABOLIC PANEL  CBC  TROPONIN I (HIGH SENSITIVITY)  TROPONIN I (HIGH SENSITIVITY)     EKG Atrial fibrillation< VR 110. QRS 76, QTc 443. No acute st elevations or depressions. No ischemia or infarct.   RADIOLOGY CXR: Interval bilateral pleural effusions, infiltrates; pulmonary hypoinflation   I also independently reviewed and agree with radiologist interpretations.   PROCEDURES:  Critical Care performed: No   .1-3 Lead EKG Interpretation  Performed by: Duffy Bruce, MD Authorized by: Duffy Bruce, MD     Interpretation: non-specific     ECG rate:  90-110   ECG rate assessment: tachycardic     Rhythm: atrial fibrillation     Ectopy: none     Conduction: normal   Comments:     Indication: SOB    MEDICATIONS ORDERED IN ED: Medications  diltiazem (CARDIZEM CD) 24 hr capsule 180 mg (has no administration in time range)  metoprolol  tartrate (LOPRESSOR) tablet 25 mg (has no administration in time range)  mirtazapine (REMERON) tablet 7.5 mg (has no administration in time range)  pantoprazole (PROTONIX) EC tablet 40 mg (has no administration in time range)  apixaban (ELIQUIS) tablet 2.5 mg (has no administration in time range)  multivitamin with minerals tablet 1 tablet (has no administration in time range)  loratadine (CLARITIN) tablet 10 mg (has no administration in time range)  furosemide (LASIX) injection 40 mg (has no administration in time range)  acetaminophen (TYLENOL) tablet 650 mg (has no administration in time range)    Or  acetaminophen (TYLENOL) suppository 650 mg (has no administration in time range)  traZODone (DESYREL) tablet 25 mg (has no administration in time range)  magnesium hydroxide (MILK OF MAGNESIA) suspension 30 mL (has no administration in time range)  ondansetron (ZOFRAN) tablet 4 mg (has no administration in time range)    Or  ondansetron (ZOFRAN) injection 4 mg  (has no administration in time range)  albuterol (PROVENTIL) (2.5 MG/3ML) 0.083% nebulizer solution 2.5 mg (has no administration in time range)  furosemide (LASIX) injection 20 mg (20 mg Intravenous Given 01/24/22 2115)  diltiazem (CARDIZEM) injection 10 mg (10 mg Intravenous Given 01/24/22 2111)  furosemide (LASIX) injection 40 mg (40 mg Intravenous Given 01/24/22 2256)     IMPRESSION / MDM / ASSESSMENT AND PLAN / ED COURSE  I reviewed the triage vital signs and the nursing notes.                              Differential diagnosis includes, but is not limited to, CHF, COPD, PNA, atelectasis, ACS, anemia  Patient's presentation is most consistent with acute presentation with potential threat to life or bodily function.  The patient is on the cardiac monitor to evaluate for evidence of arrhythmia and/or significant heart rate changes.  89 yo F with h/o CHF, AFib here with SOB. Clinically pt appears hypervolemic with suspicion of acute on chronic CHF with symptomatic AFib. Metop, lasix given. EKG nonischemic. Labs show  likely hypervolemic hyponatremia. CBC without leukocytosis. BNP 400. Tro negative. CXR shows b/l pleural effusions.  Will give lasix, admit to medicine.  FINAL CLINICAL IMPRESSION(S) / ED DIAGNOSES   Final diagnoses:  Acute on chronic congestive heart failure, unspecified heart failure type ([place])     Rx / DC Orders   ED Discharge Orders     None        Note:  This document was prepared using Dragon voice recognition software and may include unintentional dictation errors.   Duffy Bruce, MD 01/24/22 [phone]

## 2022-01-24 NOTE — H&P (Signed)
[place]   PATIENT NAME: Leslie Johnston    MR#:  [id number]  DATE OF BIRTH:  04-Aug-1925  DATE OF ADMISSION:  01/24/2022  PRIMARY CARE PHYSICIAN: Olin Hauser, DO   Patient is coming from: Home  REQUESTING/REFERRING PHYSICIAN: Duffy Bruce, MD  CHIEF COMPLAINT:   Chief Complaint  Patient presents with   Shortness of Breath    HISTORY OF PRESENT ILLNESS:  Leslie Johnston is a 89 y.o. Caucasian female with medical history significant for GERD, hypertension, chronic low back pain, atrial fibrillation and allergic rhinitis, who presented to the emergency room with a [place] of worsening dyspnea with associated worsening lower extremity edema.  She has orthopnea without significant worsening and admits to dyspnea on exertion.  No fever or chills.  No worsening cough or wheezing.  No nausea or vomiting or abdominal pain.  No dysuria, oliguria or hematuria or flank pain.  No bleeding diathesis  ED Course: When she came to the ER, BP was 153/105 with heart rate 109 and respiratory rate of 20 and later 23.  BP has been as high as 164/115.  Pulse oxymetry dropped to 82% on room air and was up to 95% on 2 L of O2 by nasal cannula.  Labs revealed hyponatremia 128 and a CO2 of 19 with a glucose of 122.  BNP was 419.8 and high sensitive troponin I was 9 twice.  Hemoglobin was 11.4 with hematocrit 36.9 EKG as reviewed by me : EKG showed atrial fibrillation with RVR of 110 with low voltage QRS and Q waves anteroseptally. Imaging: Portable chest ray showed small bilateral pleural effusions left greater than right with superimposed left basilar atelectasis or infiltrates and pulmonary hypertension. Most recent 2D echo on 02/11/2020 revealed EF of 60 to 65% with grade 1 diastolic dysfunction.  The patient was given total of 60 mg IV Lasix as well as 10 mg of IV Cardizem.  She will be admitted to a cardiac telemetry bed for further evaluation and management.  PAST MEDICAL  HISTORY:   Past Medical History:  Diagnosis Date   A-fib ([place])    Allergic rhinitis    Allergy    Chronic low back pain    GERD (gastroesophageal reflux disease)    Hypertension     PAST SURGICAL HISTORY:   Past Surgical History:  Procedure Laterality Date   EYE SURGERY     cataract extraction- Right   KYPHOPLASTY N/A 11/26/2017   Procedure: [id number],L2;  Surgeon: Hessie Knows, MD;  Location: [facility] ORS;  Service: Orthopedics;  Laterality: N/A;    SOCIAL HISTORY:   Social History   Tobacco Use   Smoking status: Never   Smokeless tobacco: Never  Substance Use Topics   Alcohol use: No    Alcohol/week: 0.0 standard drinks of alcohol    FAMILY HISTORY:   Family History  Problem Relation Age of Onset   Pancreatic cancer Sister    Arthritis Maternal Grandmother    Leukemia Maternal Grandfather    Asthma Maternal Grandfather    Breast cancer Daughter    Bladder Cancer Daughter     DRUG ALLERGIES:   Allergies  Allergen Reactions   Amlodipine Swelling   Losartan Itching   Penicillins Hives and Other (See Comments)    Has patient had a PCN reaction causing immediate rash, facial/tongue/throat swelling, SOB or lightheadedness with hypotension: No Has patient had a PCN reaction causing severe rash involving mucus membranes or skin necrosis: No Has patient had  a PCN reaction that required hospitalization: No Has patient had a PCN reaction occurring within the last 10 years: No If all of the above answers are "NO", then may proceed with Cephalosporin use.    Tramadol Other (See Comments)    Confusion    REVIEW OF SYSTEMS:   ROS As per history of present illness. All pertinent systems were reviewed above. Constitutional, HEENT, cardiovascular, respiratory, GI, GU, musculoskeletal, neuro, psychiatric, endocrine, integumentary and hematologic systems were reviewed and are otherwise negative/unremarkable except for positive findings mentioned above in the  HPI.   MEDICATIONS AT HOME:   Prior to Admission medications   Medication Sig Start Date End Date Taking? Authorizing Provider  acetaminophen (TYLENOL) 650 MG CR tablet Take 650 mg by mouth every 8 (eight) hours as needed for pain.    [provider]  albuterol (VENTOLIN HFA) 108 (90 Base) MCG/ACT inhaler INHALE 1 TO 2 PUFFS INTO THE LUNGS EVERY 6 HOURS AS NEEDED FOR WHEEZING OR SHORTNESS OF BREATH 11/22/21   Karamalegos, Devonne Doughty, DO  apixaban (ELIQUIS) 2.5 MG TABS tablet TAKE 1 TABLET(2.5 MG) BY MOUTH TWICE DAILY 01/22/22   Furth, Cadence H, PA-C  diltiazem (CARDIZEM CD) 180 MG 24 hr capsule TAKE 1 CAPSULE(180 MG) BY MOUTH DAILY 08/21/21   Minna Merritts, MD  lisinopril (ZESTRIL) 20 MG tablet TAKE 1 TABLET(20 MG) BY MOUTH DAILY 07/05/21   Karamalegos, Devonne Doughty, DO  loratadine (CLARITIN) 10 MG tablet Take 10 mg by mouth daily.    [provider]  metoprolol tartrate (LOPRESSOR) 25 MG tablet Take 1 tablet (25 mg) by mouth twice daily 01/22/22   Minna Merritts, MD  mirtazapine (REMERON) 7.5 MG tablet TAKE 1 TABLET(7.5 MG) BY MOUTH AT BEDTIME 11/06/21   Karamalegos, Devonne Doughty, DO  Multiple Vitamin (MULTIVITAMIN WITH MINERALS) TABS tablet Take 1 tablet by mouth daily with lunch.     [provider]  Multiple Vitamins-Minerals (PRESERVISION AREDS 2+MULTI VIT PO) Take by mouth.    [provider]  pantoprazole (PROTONIX) 40 MG tablet TAKE 1 TABLET BY MOUTH DAILY BEFORE BREAKFAST 05/19/21   Karamalegos, Devonne Doughty, DO      VITAL SIGNS:  Blood pressure (!) 161/102, pulse (!) 109, resp. rate (!) 26, height '5\' 5"'$  (1.651 m), weight 65 kg, SpO2 94 %.  PHYSICAL EXAMINATION:  Physical Exam  GENERAL:  89 y.o.-year-old Caucasian female patient lying in the bed with mild respiratory distress with conversational dyspnea. EYES: Pupils equal, round, reactive to light and accommodation. No scleral icterus. Extraocular muscles intact.  HEENT: Head atraumatic,  normocephalic. Oropharynx and nasopharynx clear.  NECK:  Supple, no jugular venous distention. No thyroid enlargement, no tenderness.  LUNGS: Diminished breath sounds with bibasilar rales.. No use of accessory muscles of respiration.  CARDIOVASCULAR: Regular rate and rhythm, S1, S2 normal. No murmurs, rubs, or gallops.  ABDOMEN: Soft, nondistended, nontender. Bowel sounds present. No organomegaly or mass.  EXTREMITIES: 1+ right lower extremity pitting edema, with no cyanosis, or clubbing.  NEUROLOGIC: Cranial nerves II through XII are intact. Muscle strength 5/5 in all extremities. Sensation intact. Gait not checked.  PSYCHIATRIC: The patient is alert and oriented x 3.  Normal affect and good eye contact. SKIN: No obvious rash, lesion, or ulcer.   LABORATORY PANEL:   CBC Recent Labs  Lab 01/24/22 2047  WBC 8.6  HGB 11.4*  HCT 36.9  PLT 258   ------------------------------------------------------------------------------------------------------------------  Chemistries  Recent Labs  Lab 01/24/22 2047  NA 128*  K  4.4  CL 99  CO2 19*  GLUCOSE 122*  BUN 20  CREATININE 0.86  CALCIUM 9.4   ------------------------------------------------------------------------------------------------------------------  Cardiac Enzymes No results for input(s): "TROPONINI" in the last 168 hours. ------------------------------------------------------------------------------------------------------------------  RADIOLOGY:  DG Chest Port 1 View  Result Date: 01/24/2022 CLINICAL DATA:  Dyspnea EXAM: PORTABLE CHEST 1 VIEW COMPARISON:  02/10/2020 FINDINGS: Lung volumes are small and pulmonary insufflation has diminished since prior examination. Small bilateral pleural effusions, left greater than right, have developed. Mild superimposed left basilar atelectasis or infiltrate. Stable chronic interstitial thickening. No pneumothorax. Cardiac size within normal limits. Pulmonary vascularity is normal. No  acute bone abnormality. IMPRESSION: 1. Interval development of small bilateral pleural effusions, left greater than right. Superimposed left basilar atelectasis or infiltrate. 2. Pulmonary hypoinflation. Electronically Signed   By: Fidela Salisbury M.D.   On: 01/24/2022 20:18      IMPRESSION AND PLAN:  Assessment and Plan: * Acute on chronic diastolic CHF (congestive heart failure) ([place]) - The patient was admitted to a cardiac telemetry bed. - Will continue diuresis with IV Lasix. - We will follow serial troponins. - Daily weights and strict I's and O's.  Acute respiratory failure with hypoxia (H[facility]) - This is clearly secondary to #1. - O2 protocol will be followed.   Atrial fibrillation with RVR ([place]) - She responded to IV Cardizem. - We will continue Eliquis. - We will continue her Lopressor.  Essential hypertension - We will continue her antihypertensive therapy.  GERD without esophagitis - We will continue PPI therapy.       DVT prophylaxis: Eliquis. Advanced Care Planning:  Code Status: The patient is DNR/DNI.  This was discussed with her and her daughter. Family Communication:  The plan of care was discussed in details with the patient (and family). I answered all questions. The patient agreed to proceed with the above mentioned plan. Further management will depend upon hospital course. Disposition Plan: Back to previous home environment Consults called: none.  All the records are reviewed and case discussed with ED provider.  Status is: Inpatient    At the time of the admission, it appears that the appropriate admission status for this patient is inpatient.  This is judged to be reasonable and necessary in order to provide the required intensity of service to ensure the patient's safety given the presenting symptoms, physical exam findings and initial radiographic and laboratory data in the context of comorbid conditions.  The patient requires inpatient status due to  high intensity of service, high risk of further deterioration and high frequency of surveillance required.  I certify that at the time of admission, it is my clinical judgment that the patient will require inpatient hospital care extending more than 2 midnights.                            Dispo: The patient is from: Home              Anticipated d/c is to: Home              Patient currently is not medically stable to d/c.              Difficult to place patient: No  Christel Mormon M.D on 01/25/2022 at 3:43 AM  [facility]   From 7 PM-7 AM, contact night-coverage [email or web address]  CC: Primary care physician; Olin Hauser, DO

## 2022-01-24 NOTE — Telephone Encounter (Signed)
Pt's daughter made aware of MD's recommendations. Appointment scheduled for 1/29 for further evaluations.   Minna Merritts, MD  [facility] [place] minutes ago (2:41 PM)    Hard to manage paroxysmal atrial fibrillation from home without being seen Would recommend office visit Unclear if we have option to increase her metoprolol or if antiarrhythmic such as amiodarone might be needed Given her age 89 years old we will try to avoid cardioversion if possible but may be needed if no other options Sometimes atrial fibrillation could contribute to fluid retention and ankle swelling On office visit, we may choose to start diuretic as needed for ankles Thx TGollan

## 2022-01-25 ENCOUNTER — Telehealth: Payer: Self-pay | Admitting: Cardiovascular Disease

## 2022-01-25 DIAGNOSIS — I5033 Acute on chronic diastolic (congestive) heart failure: Secondary | ICD-10-CM | POA: Diagnosis not present

## 2022-01-25 DIAGNOSIS — J9601 Acute respiratory failure with hypoxia: Secondary | ICD-10-CM

## 2022-01-25 DIAGNOSIS — K219 Gastro-esophageal reflux disease without esophagitis: Secondary | ICD-10-CM | POA: Insufficient documentation

## 2022-01-25 DIAGNOSIS — I10 Essential (primary) hypertension: Secondary | ICD-10-CM | POA: Insufficient documentation

## 2022-01-25 LAB — CBC
HCT: 39.3 % (ref 36.0–46.0)
Hemoglobin: 12.4 g/dL (ref 12.0–15.0)
MCH: 27.5 pg (ref 26.0–34.0)
MCHC: 31.6 g/dL (ref 30.0–36.0)
MCV: 87.1 fL (ref 80.0–100.0)
Platelets: 260 10*3/uL (ref 150–400)
RBC: 4.51 MIL/uL (ref 3.87–5.11)
RDW: 13.1 % (ref 11.5–15.5)
WBC: 9.2 10*3/uL (ref 4.0–10.5)
nRBC: 0 % (ref 0.0–0.2)

## 2022-01-25 LAB — BASIC METABOLIC PANEL
Anion gap: 8 (ref 5–15)
BUN: 18 mg/dL (ref 8–23)
CO2: 24 mmol/L (ref 22–32)
Calcium: 9.6 mg/dL (ref 8.9–10.3)
Chloride: 101 mmol/L (ref 98–111)
Creatinine, Ser: 0.95 mg/dL (ref 0.44–1.00)
GFR, Estimated: 56 mL/min — ABNORMAL LOW (ref >60)
Glucose, Bld: 108 mg/dL — ABNORMAL HIGH (ref 70–99)
Potassium: 4 mmol/L (ref 3.5–5.1)
Sodium: 133 mmol/L — ABNORMAL LOW (ref 135–145)

## 2022-01-25 LAB — TROPONIN I (HIGH SENSITIVITY): Troponin I (High Sensitivity): 9 ng/L (ref <18)

## 2022-01-25 MED ORDER — METHOCARBAMOL 500 MG PO TABS
500.0000 mg | ORAL_TABLET | Freq: Three times a day (TID) | ORAL | Status: DC | PRN
  Administered 2022-01-25 – 2022-01-27 (×3): 500 mg via ORAL
  Filled 2022-01-25 (×3): qty 1

## 2022-01-25 MED ORDER — FUROSEMIDE 10 MG/ML IJ SOLN
20.0000 mg | Freq: Two times a day (BID) | INTRAMUSCULAR | Status: DC
  Administered 2022-01-25: 20 mg via INTRAVENOUS
  Filled 2022-01-25 (×2): qty 2

## 2022-01-25 MED ORDER — LORATADINE 10 MG PO TABS
10.0000 mg | ORAL_TABLET | Freq: Every day | ORAL | Status: DC
  Administered 2022-01-25 – 2022-01-29 (×5): 10 mg via ORAL
  Filled 2022-01-25 (×5): qty 1

## 2022-01-25 MED ORDER — LISINOPRIL 20 MG PO TABS
20.0000 mg | ORAL_TABLET | Freq: Every day | ORAL | Status: DC
  Administered 2022-01-25 – 2022-01-26 (×2): 20 mg via ORAL
  Filled 2022-01-25: qty 2
  Filled 2022-01-25: qty 1

## 2022-01-25 MED ORDER — METHOCARBAMOL 500 MG PO TABS
500.0000 mg | ORAL_TABLET | Freq: Once | ORAL | Status: AC
  Administered 2022-01-25: 500 mg via ORAL
  Filled 2022-01-25: qty 1

## 2022-01-25 NOTE — ED Notes (Signed)
Pt c/o L lower leg cramping. No redness, swelling, or excessive warmth to the extremity. Pt has put out 1345m urine since midnight. NP notified. Robaxin ordered

## 2022-01-25 NOTE — Assessment & Plan Note (Signed)
-  We will continue PPI therapy.

## 2022-01-25 NOTE — Assessment & Plan Note (Signed)
-  She responded to IV Cardizem. - We will continue Eliquis. - We will continue her Lopressor.

## 2022-01-25 NOTE — Progress Notes (Signed)
PROGRESS NOTE    Leslie Johnston  DVV:[id number] DOB: [birth year] DOA: 01/24/2022 PCP: Olin Hauser, DO    Brief Narrative:  89 y.o. Caucasian female with medical history significant for GERD, hypertension, chronic low back pain, atrial fibrillation and allergic rhinitis, who presented to the emergency room with a [place] of worsening dyspnea with associated worsening lower extremity edema.  She has orthopnea without significant worsening and admits to dyspnea on exertion.  No fever or chills.  No worsening cough or wheezing.  No nausea or vomiting or abdominal pain.  No dysuria, oliguria or hematuria or flank pain.  No bleeding diathesis   Assessment & Plan:   Principal Problem:   Acute on chronic diastolic CHF (congestive heart failure) (HCC) Active Problems:   Acute respiratory failure with hypoxia (HCC)   Atrial fibrillation with RVR (HCC)   GERD without esophagitis   Essential hypertension  * Acute on chronic diastolic CHF (congestive heart failure) (HCC) Suspect mild diastolic congestive heart failure.  Patient's respiratory status appears to be improving since admission.  Last echocardiogram showed grade 1 diastolic dysfunction with normal ejection fraction Plan: Continue IV Lasix 40 mg twice daily Target net of 1-1.5 L daily Daily weights, strict I's and O's Defer repeat echocardiogram for now   Acute respiratory failure with hypoxia ([place]) Likely related to fluid overload state.  Continue oxygen via nasal cannula.  Wean as tolerated     Atrial fibrillation with RVR (HCC) Continue to p.o. Cardizem, p.o. Lopressor as per home dose.  Continue Eliquis for stroke prophylaxis.   Essential hypertension Home antihypertensive regimen has been resumed   GERD without esophagitis PTA PPI    DVT prophylaxis: Eliquis Code Status: DNR Family Communication: Son at bedside 1/25 Disposition Plan: Status is: Inpatient Remains inpatient appropriate because:  Decompensated heart failure on IV diuresis   Level of care: Telemetry Cardiac  Consultants:  None  Procedures:  None  Antimicrobials: None   Subjective: Seen and examined.  Poor historian but per son at bedside patient has been stable overnight.  Objective: Vitals:   01/25/22 0600 01/25/22 0800 01/25/22 0824 01/25/22 1000  BP: (!) 158/120 (!) 142/96  (!) 145/102  Pulse: 82 (!) 126  (!) 123  Resp:  20    Temp:   97.9 F (36.6 C)   TempSrc:   Oral   SpO2: 92% 95%  96%  Weight:      Height:        Intake/Output Summary (Last 24 hours) at 01/25/2022 1405 Last data filed at 01/25/2022 1108 Gross per 24 hour  Intake --  Output 2250 ml  Net -2250 ml   Filed Weights   01/24/22 1934 01/24/22 2042  Weight: 57 kg 65 kg    Examination:  General exam: NAD.  Frail-appearing Respiratory system: Lung sounds diminished at bases.  Normal work of breathing.  2 L Cardiovascular system: S1-S2, tachycardic, irregular rhythm, no murmurs, trace pedal edema Gastrointestinal system: Thin, soft, NT/ND, normal bowel sounds Central nervous system: Alert.  Oriented x 1.  No focal deficits Extremities: Symmetric 5 x 5 power. Skin: No rashes, lesions or ulcers Psychiatry: Judgement and insight appear impaired. Mood & affect confused.     Data Reviewed: I have personally reviewed following labs and imaging studies  CBC: Recent Labs  Lab 01/24/22 2047 01/25/22 0418  WBC 8.6 9.2  HGB 11.4* 12.4  HCT 36.9 39.3  MCV 88.3 87.1  PLT 258 626   Basic Metabolic Panel: Recent Labs  Lab 01/24/22 2047 01/25/22 0418  NA 128* 133*  K 4.4 4.0  CL 99 101  CO2 19* 24  GLUCOSE 122* 108*  BUN 20 18  CREATININE 0.86 0.95  CALCIUM 9.4 9.6   GFR: Estimated Creatinine Clearance: 32.6 mL/min (by C-G formula based on SCr of 0.95 mg/dL). Liver Function Tests: No results for input(s): "AST", "ALT", "ALKPHOS", "BILITOT", "PROT", "ALBUMIN" in the last 168 hours. No results for input(s):  "LIPASE", "AMYLASE" in the last 168 hours. No results for input(s): "AMMONIA" in the last 168 hours. Coagulation Profile: No results for input(s): "INR", "PROTIME" in the last 168 hours. Cardiac Enzymes: No results for input(s): "CKTOTAL", "CKMB", "CKMBINDEX", "TROPONINI" in the last 168 hours. BNP (last 3 results) No results for input(s): "PROBNP" in the last 8760 hours. HbA1C: No results for input(s): "HGBA1C" in the last 72 hours. CBG: No results for input(s): "GLUCAP" in the last 168 hours. Lipid Profile: No results for input(s): "CHOL", "HDL", "LDLCALC", "TRIG", "CHOLHDL", "LDLDIRECT" in the last 72 hours. Thyroid Function Tests: No results for input(s): "TSH", "T4TOTAL", "FREET4", "T3FREE", "THYROIDAB" in the last 72 hours. Anemia Panel: No results for input(s): "VITAMINB12", "FOLATE", "FERRITIN", "TIBC", "IRON", "RETICCTPCT" in the last 72 hours. Sepsis Labs: No results for input(s): "PROCALCITON", "LATICACIDVEN" in the last 168 hours.  No results found for this or any previous visit (from the past 240 hour(s)).       Radiology Studies: DG Chest Port 1 View  Result Date: 01/24/2022 CLINICAL DATA:  Dyspnea EXAM: PORTABLE CHEST 1 VIEW COMPARISON:  02/10/2020 FINDINGS: Lung volumes are small and pulmonary insufflation has diminished since prior examination. Small bilateral pleural effusions, left greater than right, have developed. Mild superimposed left basilar atelectasis or infiltrate. Stable chronic interstitial thickening. No pneumothorax. Cardiac size within normal limits. Pulmonary vascularity is normal. No acute bone abnormality. IMPRESSION: 1. Interval development of small bilateral pleural effusions, left greater than right. Superimposed left basilar atelectasis or infiltrate. 2. Pulmonary hypoinflation. Electronically Signed   By: Fidela Salisbury M.D.   On: 01/24/2022 20:18        Scheduled Meds:  apixaban  2.5 mg Oral BID   diltiazem  180 mg Oral Daily    furosemide  40 mg Intravenous Q12H   lisinopril  20 mg Oral Daily   loratadine  10 mg Oral Daily   metoprolol tartrate  25 mg Oral BID   mirtazapine  7.5 mg Oral QHS   multivitamin with minerals  1 tablet Oral Q lunch   pantoprazole  40 mg Oral QAC breakfast   Continuous Infusions:   LOS: 1 day       Sidney Ace, MD [facility]   If 7PM-7AM, please contact night-coverage  01/25/2022, 2:05 PM

## 2022-01-25 NOTE — Discharge Instructions (Signed)

## 2022-01-25 NOTE — ED Notes (Signed)
Gave pt a warm blanket, apple sauce and crackers. Pt son at bedside. Denies any needs at this time

## 2022-01-25 NOTE — Assessment & Plan Note (Signed)
-  We will continue her antihypertensive therapy 

## 2022-01-25 NOTE — Assessment & Plan Note (Signed)
-  This is clearly secondary to #1. - O2 protocol will be followed.

## 2022-01-25 NOTE — Telephone Encounter (Signed)
Left a message for the patient to call back.  

## 2022-01-25 NOTE — Telephone Encounter (Signed)
Pt's daughter calling to make provider aware that pt has currently been admitted into the hospital at [facility]. Daughter would like a callback. Please advise

## 2022-01-25 NOTE — ED Notes (Signed)
Pt adjusted in bed

## 2022-01-25 NOTE — Assessment & Plan Note (Signed)
-  The patient was admitted to a cardiac telemetry bed. - Will continue diuresis with IV Lasix. - We will follow serial troponins. - Daily weights and strict I's and O's.

## 2022-01-26 ENCOUNTER — Inpatient Hospital Stay (HOSPITAL_COMMUNITY)
Admit: 2022-01-26 | Discharge: 2022-01-26 | Disposition: A | Discharge status: Home / Self Care | Payer: Medicare Other | Attending: Cardiovascular Disease | Admitting: Cardiovascular Disease

## 2022-01-26 DIAGNOSIS — J9601 Acute respiratory failure with hypoxia: Secondary | ICD-10-CM | POA: Diagnosis not present

## 2022-01-26 DIAGNOSIS — I4819 Other persistent atrial fibrillation: Secondary | ICD-10-CM | POA: Diagnosis not present

## 2022-01-26 DIAGNOSIS — I4891 Unspecified atrial fibrillation: Secondary | ICD-10-CM | POA: Diagnosis not present

## 2022-01-26 DIAGNOSIS — I509 Heart failure, unspecified: Secondary | ICD-10-CM

## 2022-01-26 DIAGNOSIS — I10 Essential (primary) hypertension: Secondary | ICD-10-CM | POA: Diagnosis not present

## 2022-01-26 DIAGNOSIS — I5033 Acute on chronic diastolic (congestive) heart failure: Secondary | ICD-10-CM | POA: Diagnosis not present

## 2022-01-26 LAB — ECHOCARDIOGRAM COMPLETE
AR max vel: 1.84 cm2
AV Area VTI: 1.63 cm2
AV Area mean vel: 1.57 cm2
AV Mean grad: 3.3 mmHg
AV Peak grad: 6.2 mmHg
Ao pk vel: 1.24 m/s
Area-P 1/2: 3.24 cm2
Height: 65 in
MV VTI: 2.16 cm2
S' Lateral: 1.7 cm
Weight: 2292.78 oz

## 2022-01-26 LAB — TSH: TSH: 1.05 u[IU]/mL (ref 0.350–4.500)

## 2022-01-26 MED ORDER — AMIODARONE HCL 200 MG PO TABS: 200.0000 mg | ORAL_TABLET | Freq: Every day | ORAL | Status: DC

## 2022-01-26 MED ORDER — AMIODARONE HCL 200 MG PO TABS
200.0000 mg | ORAL_TABLET | Freq: Two times a day (BID) | ORAL | Status: DC
  Administered 2022-01-26 – 2022-01-29 (×7): 200 mg via ORAL
  Filled 2022-01-26 (×7): qty 1

## 2022-01-26 MED ORDER — FUROSEMIDE 20 MG PO TABS
20.0000 mg | ORAL_TABLET | Freq: Every day | ORAL | Status: DC
  Administered 2022-01-26: 20 mg via ORAL
  Filled 2022-01-26: qty 1

## 2022-01-26 NOTE — Progress Notes (Signed)
Patietn care taken over by this RN.  Purwick removed.  Patient able to ambulate to [facility] with 1 assist.  Family at bedside

## 2022-01-26 NOTE — Care Management Important Message (Signed)
Important Message  Patient Details  Name: Leslie Johnston MRN: [id number] Date of Birth: November 28, 1925   Medicare Important Message Given:  N/A - LOS <3 / Initial given by admissions     [name] 01/26/2022, 8:40 AM

## 2022-01-26 NOTE — Progress Notes (Signed)
*  PRELIMINARY RESULTS* Echocardiogram 2D Echocardiogram has been performed.  Elpidio Anis 01/26/2022, 2:17 PM

## 2022-01-26 NOTE — Progress Notes (Signed)
PROGRESS NOTE    Leslie Johnston  [id number]:[id number] DOB: September 03, 1925 DOA: 01/24/2022 PCP: Olin Hauser, DO    Brief Narrative:  89 y.o. Caucasian female with medical history significant for GERD, hypertension, chronic low back pain, atrial fibrillation and allergic rhinitis, who presented to the emergency room with a CC of worsening dyspnea with associated worsening lower extremity edema.  She has orthopnea without significant worsening and admits to dyspnea on exertion.  No fever or chills.  No worsening cough or wheezing.  No nausea or vomiting or abdominal pain.  No dysuria, oliguria or hematuria or flank pain.  No bleeding diathesis.  Patient volume status is improved but has had difficulty with atrial fibrillation rate controlled.  Cardiology consulted.  Plan for addition of amiodarone 200 mg twice daily in addition to AV nodal blocking agents.  Lasix 20 mg p.o. daily after good IV diuresis.  Plan for discharge 1/27.  Assessment & Plan:   Principal Problem:   Acute on chronic diastolic CHF (congestive heart failure) (HCC) Active Problems:   Acute respiratory failure with hypoxia (HCC)   Atrial fibrillation with RVR (HCC)   GERD without esophagitis   Essential hypertension  * Acute on chronic diastolic CHF (congestive heart failure) (HCC) Suspect mild diastolic congestive heart failure.  Patient's respiratory status appears to be improving since admission.  Last echocardiogram showed grade 1 diastolic dysfunction with normal ejection fraction Patient is diuresed effectively Plan: DC IV Lasix Start p.o. Lasix 20 mg daily Daily weights, strict I's and O's Defer repeat echocardiogram for now   Acute respiratory failure with hypoxia ([place]) Likely related to fluid overload state.  Continue oxygen via nasal cannula.  Wean as tolerated     Atrial fibrillation with RVR (HCC) Rate control is been difficult to achieve Plan: Continue p.o. Cardizem, p.o. Lopressor per home  dose Amiodarone 200 mg twice daily added per cardiology Anticipate discharge tomorrow if rate control improved  Essential hypertension Home antihypertensive regimen has been resumed   GERD without esophagitis PTA PPI    DVT prophylaxis: Eliquis Code Status: DNR Family Communication: Son at bedside 1/25, daughter bedside 1/26 Disposition Plan: Status is: Inpatient Remains inpatient appropriate because: Mild decompensated diastolic heart failure, atrial fibrillation with rapid ventricular response   Level of care: Telemetry Cardiac  Consultants:  None  Procedures:  None  Antimicrobials: None   Subjective: Seen and examined.  Patient sleeping this morning.  Daughter at bedside.  Objective: Vitals:   01/25/22 2010 01/26/22 0015 01/26/22 0514 01/26/22 0738  BP: (!) 146/106 (!) 134/104 (!) 137/96 109/77  Pulse: (!) 106 (!) 106 (!) 121 88  Resp: '20 18 18 16  '$ Temp: 98.4 F (36.9 C) 97.7 F (36.5 C) 97.7 F (36.5 C) 98.3 F (36.8 C)  TempSrc:    Oral  SpO2: 95% 98% 98% 91%  Weight:      Height:        Intake/Output Summary (Last 24 hours) at 01/26/2022 1134 Last data filed at 01/26/2022 0100 Gross per 24 hour  Intake --  Output 600 ml  Net -600 ml   Filed Weights   01/24/22 1934 01/24/22 2042  Weight: 57 kg 65 kg    Examination:  General exam: No acute distress.  Appears frail Respiratory system: Decreased at bases.  Normal work of breathing.  Room air Cardiovascular system: S1-S2, tachycardic, irregular rhythm, no murmurs, trace pedal edema Gastrointestinal system: Thin, soft, NT/ND, normal bowel sounds Central nervous system: Alert.  Oriented x 1.  No focal deficits Extremities: Symmetric 5 x 5 power. Skin: No rashes, lesions or ulcers Psychiatry: Judgement and insight appear impaired. Mood & affect confused.     Data Reviewed: I have personally reviewed following labs and imaging studies  CBC: Recent Labs  Lab 01/24/22 2047 01/25/22 0418  WBC  8.6 9.2  HGB 11.4* 12.4  HCT 36.9 39.3  MCV 88.3 87.1  PLT 258 329   Basic Metabolic Panel: Recent Labs  Lab 01/24/22 2047 01/25/22 0418  NA 128* 133*  K 4.4 4.0  CL 99 101  CO2 19* 24  GLUCOSE 122* 108*  BUN 20 18  CREATININE 0.86 0.95  CALCIUM 9.4 9.6   GFR: Estimated Creatinine Clearance: 32.6 mL/min (by C-G formula based on SCr of 0.95 mg/dL). Liver Function Tests: No results for input(s): "AST", "ALT", "ALKPHOS", "BILITOT", "PROT", "ALBUMIN" in the last 168 hours. No results for input(s): "LIPASE", "AMYLASE" in the last 168 hours. No results for input(s): "AMMONIA" in the last 168 hours. Coagulation Profile: No results for input(s): "INR", "PROTIME" in the last 168 hours. Cardiac Enzymes: No results for input(s): "CKTOTAL", "CKMB", "CKMBINDEX", "TROPONINI" in the last 168 hours. BNP (last 3 results) No results for input(s): "PROBNP" in the last 8760 hours. HbA1C: No results for input(s): "HGBA1C" in the last 72 hours. CBG: No results for input(s): "GLUCAP" in the last 168 hours. Lipid Profile: No results for input(s): "CHOL", "HDL", "LDLCALC", "TRIG", "CHOLHDL", "LDLDIRECT" in the last 72 hours. Thyroid Function Tests: No results for input(s): "TSH", "T4TOTAL", "FREET4", "T3FREE", "THYROIDAB" in the last 72 hours. Anemia Panel: No results for input(s): "VITAMINB12", "FOLATE", "FERRITIN", "TIBC", "IRON", "RETICCTPCT" in the last 72 hours. Sepsis Labs: No results for input(s): "PROCALCITON", "LATICACIDVEN" in the last 168 hours.  No results found for this or any previous visit (from the past 240 hour(s)).       Radiology Studies: DG Chest Port 1 View  Result Date: 01/24/2022 CLINICAL DATA:  Dyspnea EXAM: PORTABLE CHEST 1 VIEW COMPARISON:  02/10/2020 FINDINGS: Lung volumes are small and pulmonary insufflation has diminished since prior examination. Small bilateral pleural effusions, left greater than right, have developed. Mild superimposed left basilar  atelectasis or infiltrate. Stable chronic interstitial thickening. No pneumothorax. Cardiac size within normal limits. Pulmonary vascularity is normal. No acute bone abnormality. IMPRESSION: 1. Interval development of small bilateral pleural effusions, left greater than right. Superimposed left basilar atelectasis or infiltrate. 2. Pulmonary hypoinflation. Electronically Signed   By: Fidela Salisbury M.D.   On: 01/24/2022 20:18        Scheduled Meds:  amiodarone  200 mg Oral BID   [START ON 02/03/2022] amiodarone  200 mg Oral Daily   apixaban  2.5 mg Oral BID   diltiazem  180 mg Oral Daily   furosemide  20 mg Oral Daily   lisinopril  20 mg Oral Daily   loratadine  10 mg Oral Daily   metoprolol tartrate  25 mg Oral BID   mirtazapine  7.5 mg Oral QHS   multivitamin with minerals  1 tablet Oral Q lunch   pantoprazole  40 mg Oral QAC breakfast   Continuous Infusions:   LOS: 2 days       Sidney Ace, MD [facility]   If 7PM-7AM, please contact night-coverage  01/26/2022, 11:34 AM

## 2022-01-26 NOTE — Consult Note (Addendum)
   Heart Failure Nurse Navigator Note  HFpEF 65%.  Moderate LVH.  Grade 1 diastolic dysfunction.   Patient presented from home with complaints of worsening dyspnea, lower extremity edema and orthopnea.  BNP was 419.  Chest x-ray revealed small bilateral pleural effusions.  Comorbidities:  Atrial fibrillation Hypertension GERD  Medications:  Amiodarone 200 mg 2 times a day Apixaban 2.5 mg 2 times a day Diltiazem 180 mg daily Furosemide 20 mg daily Lisinopril 20 mg daily Metoprolol tart [place] 25 mg 2 times a day   Labs:  Sodium 133, potassium 4, chloride 101 CO2 24, BUN 18, creatinine 0.95 estimated GFR 56, hemoglobin 12.4, hematocrit 39 Blood pressure 109/77   Meeting with patient and her daughter [name], who was at the bedside.  The daughter, Hassan Rowan that she lives with was not at the bedside.  Discussed heart failure and what it means.  Went over low-sodium diet.  Patient states that at least 3 times a week she likes to get a biscuit from [facility].  Talked about how it is okay to treat yourself once in a while but numerous times a week is probably not a good idea for sticking with a low-sodium diet.  They voiced understanding.  Discussed  substituting English muffin instead of the biscuit.  Discussed using fresh and frozen fruits and vegetables  and lean meats.  Staying away from processed and convince foods.   Also went over fluid restriction of 64 ounces daily and what is included in that list of fluids.  States that she does not weigh herself on a daily basis.  Explained the importance behind daily in being weight gains of 2 pounds overnight or 5 pounds total within the week.  Also to report changes in symptoms such as worsening shortness of breath, lower extremity edema, PND, orthopnea and dry cough.  Voiced understanding.  It aware of the appointment in the outpatient heart failure clinic for which she has an appointment on February 2 at 11:30 in the morning.  Given  the living with heart failure teaching booklet, zone magnet, info on low-sodium and heart failure along with weight chart.  They had no further questions.  Pricilla Riffle RN CHFN

## 2022-01-26 NOTE — TOC Initial Note (Signed)
Transition of Care [facility]) - Initial/Assessment Note    Patient Details  Name: Leslie Johnston MRN: [id number] Date of Birth: August 22, 1925  Transition of Care [facility]) CM/SW Contact:    Laurena Slimmer, RN Phone Number: 01/26/2022, 2:00 PM  Clinical Narrative:                  Transition of Care (TOC) Screening Note   Patient Details  Name: Leslie Johnston Date of Birth: [birth year]   Transition of Care [facility]) CM/SW Contact:    Laurena Slimmer, RN Phone Number: 01/26/2022, 2:00 PM    Transition of Care Department [facility]) has reviewed patient and no TOC needs have been identified at this time. We will continue to monitor patient advancement through interdisciplinary progression rounds. If new patient transition needs arise, please place a TOC consult.          Patient Goals and CMS Choice            Expected Discharge Plan and Services                                              Prior Living Arrangements/Services                       Activities of Daily Living      Permission Sought/Granted                  Emotional Assessment              Admission diagnosis:  Acute on chronic diastolic CHF (congestive heart failure) (HCC) [I50.33] Acute on chronic congestive heart failure, unspecified heart failure type ([place]) [I50.9] Patient Active Problem List   Diagnosis Date Noted   Acute on chronic congestive heart failure ([place]) 01/26/2022   GERD without esophagitis 01/25/2022   Essential hypertension 01/25/2022   Acute respiratory failure with hypoxia ([place]) 01/25/2022   Acute on chronic diastolic CHF (congestive heart failure) ([place]) 01/24/2022   Atherosclerosis of aorta ([place]) 09/12/2020   Radiculopathy, lumbar region 05/19/2020   Spinal stenosis of lumbar region 05/19/2020   Unspecified dementia, unspecified severity, without behavioral disturbance, psychotic disturbance, mood disturbance, and anxiety ([place]) 05/19/2020   Acute  epigastric pain 05/15/2020   A-fib ([place])    GERD (gastroesophageal reflux disease)    Hypertension    Chronic bilateral low back pain without sciatica 03/08/2020   Atrial fibrillation with RVR ([place]) 02/10/2020   OAB (overactive bladder) 04/15/2019   Dysthymia 04/15/2019   Dependence on supplemental oxygen 01/01/2018   Long-term current use of opiate analgesic 01/01/2018   Postmenopausal osteoporosis with pathological fracture of humerus 09/13/2016   Humerus fracture 08/29/2016   Chronic seasonal allergic rhinitis 03/20/2016   Unspecified urinary incontinence 01/02/2016   Abnormal chest x-ray 09/19/2015   Benign hypertension with CKD (chronic kidney disease) stage III 07/27/2014   PCP:  Olin Hauser, DO Pharmacy:   [facility] [place], [place] AT [facility] OF [address] [place] [address] Phone: [phone] Fax: [phone]     Social Determinants of Health (SDOH) Social History: SDOH Screenings   Food Insecurity: No Food Insecurity (03/24/2021)  Housing: Low Risk  (03/24/2021)  Transportation Needs: No Transportation Needs (03/24/2021)  Alcohol Screen: Low Risk  (03/24/2021)  Depression (  PHQ2-9): Medium Risk (09/13/2021)  Financial Resource Strain: Low Risk  (03/24/2021)  Physical Activity: Insufficiently Active (03/24/2021)  Social Connections: Socially Isolated (03/24/2021)  Stress: No Stress Concern Present (03/24/2021)  Tobacco Use: Low Risk  (01/24/2022)   SDOH Interventions:     Readmission Risk Interventions     No data to display

## 2022-01-26 NOTE — Consult Note (Signed)
Cardiology Consultation   Patient ID: Leslie Johnston MRN: [id number]; DOB: 17-Apr-1925  Admit date: 01/24/2022 Date of Consult: 01/26/2022  PCP:  Olin Hauser, [place] Prov[facility] Cardiologist:  Ida Rogue, MD        Patient Profile:   Leslie Johnston is a 89 y.o. female with a hx of hypertension, GERD, chronic low back pain, paroxysmal atrial fibrillation, allergic rhinitis, overactive bladder, osteoporosis, dementia, and stage III chronic kidney disease who is being seen 01/26/2022 for the evaluation of atrial fibrillation RVR at the request of Dr Priscella Mann.  History of Present Illness:   Leslie Johnston is a 89 year old female with a past medical history previously mention of hypertension, gastroesophageal reflux disease, chronic low back pain, paroxysmal atrial fibrillation on chronic anticoagulation with apixaban, allergic rhinitis, overactive bladder, osteoporosis, dementia, stage III chronic kidney disease and atherosclerosis of the aorta noted on CT.  Seen in clinic 12/01/2021 by Dr. Rockey Situ and stated that she was doing well.  Her diltiazem was consolidated to 180 mg daily and she was continued on apixaban 2.5 mg twice daily for stroke prophylaxis.  She presented to the [facility] emergency department on 01/24/2022 with complaints of shortness of breath.  She reported shortness of breath that been ongoing over the last several days and has been progressively worsening.  She also noted that she had leg swelling that was accompanied with the shortness of breath.  The some symptoms had worsened today with significant work of breathing along with some associated chest pressure which prompted her visit to the emergency department.  Up until this point she stated she had been in her normal state of health without palpitations, fevers, cough, abdominal pain, nausea vomiting or diarrhea. Per daughter in the room patient had swollen feet on Tuesday of this week and the  pulse-ox was reading increased heart rates with oxygen sats varying. She was started on metoprolol after calling the clinic but arrived to the er prior to scheduled follow-up  Initial vitals: Blood pressure 153/25, pulse 109, respirations of 20  Pertinent labs: Revealed a sodium of 128, CO2 19, blood glucose 122, hemoglobin 11.4, BNP of 419.8  Imaging: Chest x-ray revealed interval bilateral pleural effusion, infiltrates, pulmonary hypoinflation  Medications received in the emergency department: furosemide 20 mg IVP, furosemide 40 mg IVP, diltiazem 10 mg IVP    Past Medical History:  Diagnosis Date   A-fib ([place])    Allergic rhinitis    Allergy    Chronic low back pain    GERD (gastroesophageal reflux disease)    Hypertension     Past Surgical History:  Procedure Laterality Date   EYE SURGERY     cataract extraction- Right   KYPHOPLASTY N/A 11/26/2017   Procedure: [id number];  Surgeon: Leslie Knows, MD;  Location: [facility] ORS;  Service: Orthopedics;  Laterality: N/A;     Home Medications:  Prior to Admission medications   Medication Sig Start Date End Date Taking? Authorizing Provider  acetaminophen (TYLENOL) 650 MG CR tablet Take 650 mg by mouth every 8 (eight) hours as needed for pain.   Yes [provider]  albuterol (VENTOLIN HFA) 108 (90 Base) MCG/ACT inhaler INHALE 1 TO 2 PUFFS INTO THE LUNGS EVERY 6 HOURS AS NEEDED FOR WHEEZING OR SHORTNESS OF BREATH 11/22/21  Yes Karamalegos, Devonne Doughty, DO  apixaban (ELIQUIS) 2.5 MG TABS tablet TAKE 1 TABLET(2.5 MG) BY MOUTH TWICE DAILY 01/22/22  Yes Furth, Cadence H, PA-C  cetirizine (ZYRTEC) 10 MG  tablet Take 10 mg by mouth daily.   Yes [provider]  diltiazem (CARDIZEM CD) 180 MG 24 hr capsule TAKE 1 CAPSULE(180 MG) BY MOUTH DAILY 08/21/21  Yes Gollan, Kathlene November, MD  lisinopril (ZESTRIL) 20 MG tablet TAKE 1 TABLET(20 MG) BY MOUTH DAILY 07/05/21  Yes Karamalegos, Devonne Doughty, DO  metoprolol tartrate (LOPRESSOR)  25 MG tablet Take 1 tablet (25 mg) by mouth twice daily 01/22/22  Yes Gollan, Kathlene November, MD  mirtazapine (REMERON) 7.5 MG tablet TAKE 1 TABLET(7.5 MG) BY MOUTH AT BEDTIME 11/06/21  Yes Karamalegos, Devonne Doughty, DO  Multiple Vitamins-Minerals (PRESERVISION AREDS 2+MULTI VIT PO) Take 1 tablet by mouth daily.   Yes [provider]  pantoprazole (PROTONIX) 40 MG tablet TAKE 1 TABLET BY MOUTH DAILY BEFORE BREAKFAST 05/19/21  Yes Karamalegos, Devonne Doughty, DO  loratadine (CLARITIN) 10 MG tablet Take 10 mg by mouth daily. Patient not taking: Reported on 01/24/2022    [provider]  Multiple Vitamin (MULTIVITAMIN WITH MINERALS) TABS tablet Take 1 tablet by mouth daily with lunch.  Patient not taking: Reported on 01/24/2022    [provider]    Inpatient Medications: Scheduled Meds:  amiodarone  200 mg Oral BID   [START ON 02/03/2022] amiodarone  200 mg Oral Daily   apixaban  2.5 mg Oral BID   diltiazem  180 mg Oral Daily   furosemide  20 mg Oral Daily   lisinopril  20 mg Oral Daily   loratadine  10 mg Oral Daily   metoprolol tartrate  25 mg Oral BID   mirtazapine  7.5 mg Oral QHS   multivitamin with minerals  1 tablet Oral Q lunch   pantoprazole  40 mg Oral QAC breakfast   Continuous Infusions:  PRN Meds: acetaminophen **OR** acetaminophen, albuterol, magnesium hydroxide, methocarbamol, ondansetron **OR** ondansetron (ZOFRAN) IV, traZODone  Allergies:    Allergies  Allergen Reactions   Amlodipine Swelling   Losartan Itching   Penicillins Hives and Other (See Comments)    Has patient had a PCN reaction causing immediate rash, facial/tongue/throat swelling, SOB or lightheadedness with hypotension: No Has patient had a PCN reaction causing severe rash involving mucus membranes or skin necrosis: No Has patient had a PCN reaction that required hospitalization: No Has patient had a PCN reaction occurring within the last 10 years: No If all of the above answers are "NO",  then may proceed with Cephalosporin use.    Tramadol Other (See Comments)    Confusion    Social History:   Social History   Socioeconomic History   Marital status: Widowed    Spouse name: Not on file   Number of children: Not on file   Years of education: Not on file   Highest education level: Not on file  Occupational History   Not on file  Tobacco Use   Smoking status: Never   Smokeless tobacco: Never  Vaping Use   Vaping Use: Never used  Substance and Sexual Activity   Alcohol use: No    Alcohol/week: 0.0 standard drinks of alcohol   Drug use: No   Sexual activity: Not on file  Other Topics Concern   Not on file  Social History Narrative   Not on file   Social Determinants of Health   Financial Resource Strain: Low Risk  (03/24/2021)   Overall Financial Resource Strain (CARDIA)    Difficulty of Paying Living Expenses: Not hard at all  Food Insecurity: No Food Insecurity (03/24/2021)   Hunger  Vital Sign    Worried About Charity fundraiser in the Last Year: Never true    Ran Out of Food in the Last Year: Never true  Transportation Needs: No Transportation Needs (03/24/2021)   PRAPARE - Hydrologist (Medical): No    Lack of Transportation (Non-Medical): No  Physical Activity: Insufficiently Active (03/24/2021)   Exercise Vital Sign    Days of Exercise per Week: 2 days    Minutes of Exercise per Session: 20 min  Stress: No Stress Concern Present (03/24/2021)   [place]    Feeling of Stress : Not at all  Social Connections: Socially Isolated (03/24/2021)   Social Connection and Isolation Panel [NHANES]    Frequency of Communication with Friends and Family: Three times a week    Frequency of Social Gatherings with Friends and Family: More than three times a week    Attends Religious Services: Never    Marine scientist or Organizations: No    Attends Theatre manager Meetings: Never    Marital Status: Widowed  Intimate Partner Violence: Not At Risk (03/24/2021)   Humiliation, Afraid, Rape, and Kick questionnaire    Fear of Current or Ex-Partner: No    Emotionally Abused: No    Physically Abused: No    Sexually Abused: No    Family History:    Family History  Problem Relation Age of Onset   Pancreatic cancer Sister    Arthritis Maternal Grandmother    Leukemia Maternal Grandfather    Asthma Maternal Grandfather    Breast cancer Daughter    Bladder Cancer Daughter      ROS:  Please see the history of present illness.  Review of Systems  Constitutional:  Positive for malaise/fatigue.  HENT:  Positive for hearing loss.   Respiratory:  Positive for cough and shortness of breath.   Cardiovascular:  Positive for leg swelling.  Musculoskeletal:  Positive for back pain.  Neurological:  Positive for weakness.    All other ROS reviewed and negative.     Physical Exam/Data:   Vitals:   01/26/22 0015 01/26/22 0514 01/26/22 0738 01/26/22 1235  BP: (!) 134/104 (!) 137/96 109/77 117/89  Pulse: (!) 106 (!) 121 88 90  Resp: '18 18 16 '$ (!) 24  Temp: 97.7 F (36.5 C) 97.7 F (36.5 C) 98.3 F (36.8 C) 98.7 F (37.1 C)  TempSrc:   Oral Oral  SpO2: 98% 98% 91% 96%  Weight:      Height:        Intake/Output Summary (Last 24 hours) at 01/26/2022 1252 Last data filed at 01/26/2022 0100 Gross per 24 hour  Intake --  Output 600 ml  Net -600 ml      01/24/2022    8:42 PM 01/24/2022    7:34 PM 12/01/2021    2:44 PM  Last 3 Weights  Weight (lbs) 143 lb 4.8 oz 125 lb 10.6 oz 126 lb  Weight (kg) 65 kg 57 kg 57.153 kg     Body mass index is 23.85 kg/m.  General:  Frail, chronically ill appearing HEENT: normal, hearing aides in Neck: no JVD Vascular: No carotid bruits; Distal pulses 2+ bilaterally Cardiac:  normal S1, S2; irregularly irregular; no murmur  Lungs:  diminished bases with crackles in the bases to auscultation bilaterally,  respirations are unlabored at rest on 1L O2 via Daniel Abd: soft, nontender, no hepatomegaly  Ext: trace  edema BLE Musculoskeletal:  No deformities, BUE and BLE strength normal and equal Skin: warm and dry  Neuro:  CNs 2-12 intact, no focal abnormalities noted Psych:  Normal affect   EKG:  The EKG was personally reviewed and demonstrates: Atrial fibrillation with a rate of 110 with left axis deviation Telemetry:  Telemetry was personally reviewed and demonstrates:  rate controlled atrial fibrillation rates 90-100 (at rest)  Relevant CV Studies: TTE 02/11/20 1. Left ventricular ejection fraction, by estimation, is 60 to 65%. The  left ventricle has normal function. The left ventricle has no regional  wall motion abnormalities. There is moderate left ventricular hypertrophy.  Near cavity obliteration in  systole. Left ventricular diastolic parameters are consistent with Grade I  diastolic dysfunction (impaired relaxation).   2. Right ventricular systolic function is normal. The right ventricular  size is normal. There is normal pulmonary artery systolic pressure. The  estimated right ventricular systolic pressure is 01.6 mmHg.   Laboratory Data:  High Sensitivity Troponin:   Recent Labs  Lab 01/24/22 2047 01/24/22 2326  TROPONINIHS 9 9     Chemistry Recent Labs  Lab 01/24/22 2047 01/25/22 0418  NA 128* 133*  K 4.4 4.0  CL 99 101  CO2 19* 24  GLUCOSE 122* 108*  BUN 20 18  CREATININE 0.86 0.95  CALCIUM 9.4 9.6  GFRNONAA >60 56*  ANIONGAP 10 8    No results for input(s): "PROT", "ALBUMIN", "AST", "ALT", "ALKPHOS", "BILITOT" in the last 168 hours. Lipids No results for input(s): "CHOL", "TRIG", "HDL", "LABVLDL", "LDLCALC", "CHOLHDL" in the last 168 hours.  Hematology Recent Labs  Lab 01/24/22 2047 01/25/22 0418  WBC 8.6 9.2  RBC 4.18 4.51  HGB 11.4* 12.4  HCT 36.9 39.3  MCV 88.3 87.1  MCH 27.3 27.5  MCHC 30.9 31.6  RDW 13.2 13.1  PLT 258 260   Thyroid No results  for input(s): "TSH", "FREET4" in the last 168 hours.  BNP Recent Labs  Lab 01/24/22 2047  BNP 419.8*    DDimer No results for input(s): "DDIMER" in the last 168 hours.   Radiology/Studies:  DG Chest Port 1 View  Result Date: 01/24/2022 CLINICAL DATA:  Dyspnea EXAM: PORTABLE CHEST 1 VIEW COMPARISON:  02/10/2020 FINDINGS: Lung volumes are small and pulmonary insufflation has diminished since prior examination. Small bilateral pleural effusions, left greater than right, have developed. Mild superimposed left basilar atelectasis or infiltrate. Stable chronic interstitial thickening. No pneumothorax. Cardiac size within normal limits. Pulmonary vascularity is normal. No acute bone abnormality. IMPRESSION: 1. Interval development of small bilateral pleural effusions, left greater than right. Superimposed left basilar atelectasis or infiltrate. 2. Pulmonary hypoinflation. Electronically Signed   By: Fidela Salisbury M.D.   On: 01/24/2022 20:18     Assessment and Plan:   Atrial fibrillation RVR with a history of paroxysmal atrial fibrillation -Continues to remain in atrial fibrillation on telemetry monitoring -Continued on apixaban 2.5 mg twice daily for CHA2DS2-VASc score of at least 5, diltiazem 180 mg daily, metoprolol tartrate 25 mg twice daily, and starting amiodarone 200 mg twice daily x 7 days and then 200 mg daily thereafter -TSH ordered today -Continue on telemetry monitoring for now -If patient does not convert with oral amiodarone can consider outpatient cardioversion  Mildly elevated BNP -BNP 419.8 -Arrived with some shortness of breath and peripheral edema -Given IV Lasix in the emergency department with 1.3 L output in 24 hours -Started on low-dose furosemide 20 mg daily today -likely secondary to atrial fibrillation  RVR -daily weight, I&O's and fluid restriction of less than 2L while in atrial fibrillation  Acute respiratory failure with hypoxia -Continue to titrate FiO2 to  maintain oxygen saturations 92% or greater -Started on Lasix today orally -Continue to monitor urine output -Since his shortness of breath is improving  Essential hypertension -Blood pressure 117/89 -Continue furosemide, diltiazem, lisinopril, and metoprolol -vital signs per unit protocol  Risk Assessment/Risk Scores:        New York Heart Association (NYHA) Functional Class NYHA Class II  CHA2DS2-VASc Score = 5   This indicates a 7.2% annual risk of stroke. The patient's score is based upon: CHF History: 1 HTN History: 1 Diabetes History: 0 Stroke History: 0 Vascular Disease History: 0 Age Score: 2 Gender Score: 1         For questions or updates, please contact [place] Please consult [email or web address] for contact info under    Signed, Leyna Vanderkolk, NP  01/26/2022 12:52 PM

## 2022-01-27 DIAGNOSIS — I10 Essential (primary) hypertension: Secondary | ICD-10-CM | POA: Diagnosis not present

## 2022-01-27 DIAGNOSIS — R079 Chest pain, unspecified: Secondary | ICD-10-CM | POA: Diagnosis not present

## 2022-01-27 DIAGNOSIS — I5031 Acute diastolic (congestive) heart failure: Secondary | ICD-10-CM | POA: Diagnosis not present

## 2022-01-27 DIAGNOSIS — I4891 Unspecified atrial fibrillation: Secondary | ICD-10-CM | POA: Diagnosis not present

## 2022-01-27 DIAGNOSIS — I5033 Acute on chronic diastolic (congestive) heart failure: Secondary | ICD-10-CM | POA: Diagnosis not present

## 2022-01-27 LAB — BASIC METABOLIC PANEL
Anion gap: 8 (ref 5–15)
BUN: 18 mg/dL (ref 8–23)
CO2: 29 mmol/L (ref 22–32)
Calcium: 9.2 mg/dL (ref 8.9–10.3)
Chloride: 96 mmol/L — ABNORMAL LOW (ref 98–111)
Creatinine, Ser: 0.97 mg/dL (ref 0.44–1.00)
GFR, Estimated: 54 mL/min — ABNORMAL LOW (ref >60)
Glucose, Bld: 98 mg/dL (ref 70–99)
Potassium: 3.9 mmol/L (ref 3.5–5.1)
Sodium: 133 mmol/L — ABNORMAL LOW (ref 135–145)

## 2022-01-27 MED ORDER — METOPROLOL TARTRATE 25 MG PO TABS
37.5000 mg | ORAL_TABLET | Freq: Two times a day (BID) | ORAL | Status: DC
  Administered 2022-01-27 (×2): 37.5 mg via ORAL
  Filled 2022-01-27 (×3): qty 2

## 2022-01-27 MED ORDER — FAMOTIDINE 20 MG PO TABS
20.0000 mg | ORAL_TABLET | Freq: Two times a day (BID) | ORAL | Status: DC
  Administered 2022-01-27 – 2022-01-29 (×5): 20 mg via ORAL
  Filled 2022-01-27 (×5): qty 1

## 2022-01-27 MED ORDER — FUROSEMIDE 20 MG PO TABS
20.0000 mg | ORAL_TABLET | Freq: Two times a day (BID) | ORAL | Status: DC
  Administered 2022-01-27 – 2022-01-29 (×4): 20 mg via ORAL
  Filled 2022-01-27 (×4): qty 1

## 2022-01-27 NOTE — Evaluation (Signed)
Physical Therapy Evaluation Patient Details Name: Leslie Johnston MRN: [id number] DOB: [birth year] Today's Date: 01/27/2022  History of Present Illness  Pt is a 89 year old female admitted with Acute on chronic diastolic CHF, Acute respiratory failure with hypoxia after presenting to the ED with worsening dyspnea with associated worsening lower extremity edema PMH significant for ERD, hypertension, chronic low back pain, atrial fibrillation and allergic rhinitis  Clinical Impression  Patient returning from bathroom with daughter upon arrival to room; seated edge of bed for initiation of session.  Patient alert and oriented to self, location and general situation; limited insight into deficits, overall safety needs.  Denies pain, but does endorse (and demonstrate) mild/mod SOB with functional activities.  Generally weak and deconditioned throughout all extremities, but no focal weakness appreciated.  Able to complete sit/stand, basic transfers and gait (40') with SPC, min assist.  Demonstrates forward flexed posture, preferring L UE HHA with therapist; partially reciprocal stepping pattern, limited balance reactions, limited functional endurance appreciated. Mild/mod SOB with exertion; sats 90-93% on RA with gait efforts  Additional 65' with RW, cga/close sup-reciprocal stepping pattern with improved mechanics, balance and overall energy conservation; cuing for postural extension and deep breathing/pursed lip breathing with gait efforts.  Do recommend continued use of RW for all mobiltiy efforts at this time; patient/family in agreement (and have RW at home). Would benefit from skilled PT to address above deficits and promote optimal return to PLOF.; Recommend transition to HHPT upon discharge from acute hospitalization.  SaO2 on room air at rest = 87% SaO2 on 2L  at rest = 92% SaO2 on room air while ambulating = 90-93%  May benefit from incentive spirometer; RN/MD informed/aware of O2 need and  response to activity.      Recommendations for follow up therapy are one component of a multi-disciplinary discharge planning process, led by the attending physician.  Recommendations may be updated based on patient status, additional functional criteria and insurance authorization.  Follow Up Recommendations Home health PT      Assistance Recommended at Discharge PRN  Patient can return home with the following  A little help with walking and/or transfers;A little help with bathing/dressing/bathroom    Equipment Recommendations  (has RW at home)  Recommendations for Other Services       Functional Status Assessment Patient has had a recent decline in their functional status and demonstrates the ability to make significant improvements in function in a reasonable and predictable amount of time.     Precautions / Restrictions Precautions Precautions: Fall Precaution Comments: watch spo2 Restrictions Weight Bearing Restrictions: No      Mobility  Bed Mobility Overal bed mobility: Needs Assistance Bed Mobility: Sit to Supine       Sit to supine: Min assist   General bed mobility comments: assist for LE elevation into bed    Transfers Overall transfer level: Needs assistance Equipment used: Rolling walker (2 wheels) Transfers: Sit to/from Stand Sit to Stand: Min guard, Min assist                Ambulation/Gait Ambulation/Gait assistance: Min Web designer (Feet): 40 Feet Assistive device: Straight cane         General Gait Details: forward flexed posture, preferring L UE HHA with therapist; partially reciprocal stepping pattern, limited balance reactions, limited functional endurance appreciated.  Mild/mod SOB with exertion; sats 90-93% on RA with gait efforts  Stairs            Wheelchair Mobility  Modified Rankin (Stroke Patients Only)       Balance Overall balance assessment: Needs assistance Sitting-balance support: No upper  extremity supported, Feet supported Sitting balance-Leahy Scale: Good     Standing balance support: Single extremity supported Standing balance-Leahy Scale: Fair                               Pertinent Vitals/Pain Pain Assessment Pain Assessment: No/denies pain    Home Living Family/patient expects to be discharged to:: Private residence Living Arrangements: Alone Available Help at Discharge: Family;Available 24 hours/day Type of Home: House Home Access: Stairs to enter Entrance Stairs-Rails: Right Entrance Stairs-Number of Steps: 3-4   Home Layout: One level Home Equipment: Conservation officer, nature (2 wheels);Cane - single point;Grab bars - tub/shower;BSC/3in1;Shower seat;Hand held shower head      Prior Function Prior Level of Function : Needs assist       Physical Assist : Mobility (physical);ADLs (physical)   ADLs (physical): IADLs;Bathing Mobility Comments: amb with SPC limited household distances (to bathroom, around house then with seated rest break) ADLs Comments: dressing, grooming, feeding, toileting with supervision-MOD I, bathing on shower chair with assist from daugther; assist for all IADLs from family     Hand Dominance   Dominant Hand: Right    Extremity/Trunk Assessment   Upper Extremity Assessment Upper Extremity Assessment: Generalized weakness    Lower Extremity Assessment Lower Extremity Assessment: Generalized weakness (grossly 4/5 throughout; no focal weakness appreciated)    Cervical / Trunk Assessment Cervical / Trunk Assessment: Kyphotic  Communication   Communication: HOH  Cognition Arousal/Alertness: Awake/alert Behavior During Therapy: WFL for tasks assessed/performed Overall Cognitive Status: Within Functional Limits for tasks assessed                           Safety/Judgement: Decreased awareness of deficits   Problem Solving: Requires verbal cues, Requires tactile cues General Comments: family endorses this  is pt current baseline, pt plesaant and agreeable, follows one step directions approrpiately        General Comments General comments (skin integrity, edema, etc.): HR up to 112 with amb, spo2 >90% on RA pre/post mobility, pt dyspenic with exertion    Exercises Other Exercises Other Exercises: 57' with RW, cga/close sup-reciprocal stepping pattern with improved mechanics, balance and overall energy conservation; cuing for postural extension and deep breathing/pursed lip breathing with gait efforts.  Do recommend continued use of RW for all mobiltiy efforts at this time; patient/family in agreement (and have RW at home).   Assessment/Plan    PT Assessment Patient needs continued PT services  PT Problem List Decreased range of motion;Decreased balance;Decreased activity tolerance;Decreased mobility;Decreased strength;Decreased coordination;Decreased knowledge of use of DME;Decreased safety awareness;Decreased knowledge of precautions;Cardiopulmonary status limiting activity       PT Treatment Interventions DME instruction;Gait training;Stair training;Functional mobility training;Therapeutic activities;Therapeutic exercise;Balance training;Patient/family education    PT Goals (Current goals can be found in the Care Plan section)  Acute Rehab PT Goals Patient Stated Goal: to return home with family support PT Goal Formulation: With patient/family Time For Goal Achievement: 02/10/22 Potential to Achieve Goals: Good    Frequency Min 2X/week     Co-evaluation               AM-PAC PT "6 Clicks" Mobility  Outcome Measure Help needed turning from your back to your side while in a flat bed without using bedrails?: None Help  needed moving from lying on your back to sitting on the side of a flat bed without using bedrails?: A Little Help needed moving to and from a bed to a chair (including a wheelchair)?: A Little Help needed standing up from a chair using your arms (e.g., wheelchair  or bedside chair)?: A Little Help needed to walk in hospital room?: A Little Help needed climbing 3-5 steps with a railing? : A Little 6 Click Score: 19    End of Session Equipment Utilized During Treatment: Gait belt;Oxygen Activity Tolerance: Patient tolerated treatment well Patient left: in bed;with call bell/phone within reach;with bed alarm set;with family/visitor present Nurse Communication: Mobility status PT Visit Diagnosis: Muscle weakness (generalized) (M62.81);Difficulty in walking, not elsewhere classified (R26.2)    Time: 1438-8875 PT Time Calculation (min) (ACUTE ONLY): 32 min   Charges:   PT Evaluation $PT Eval Moderate Complexity: 1 Mod PT Treatments $Gait Training: 8-22 mins        Yahmir Sokolov H. Owens Shark, PT, DPT, NCS 01/27/22, 11:45 AM [phone]

## 2022-01-27 NOTE — Progress Notes (Addendum)
Progress Note  Patient Name: Leslie Johnston Date of Encounter: 01/27/2022  [facility] Cardiologist: Ida Rogue, MD   Subjective  Remains in atrial fibrillation with heart rates in the upper 90s to low 100s  Still short of breath but improving.  She has had some intermittent chest tightness at times Inpatient Medications    Scheduled Meds:  amiodarone  200 mg Oral BID   [START ON 02/03/2022] amiodarone  200 mg Oral Daily   apixaban  2.5 mg Oral BID   diltiazem  180 mg Oral Daily   furosemide  20 mg Oral Daily   lisinopril  20 mg Oral Daily   loratadine  10 mg Oral Daily   metoprolol tartrate  25 mg Oral BID   mirtazapine  7.5 mg Oral QHS   multivitamin with minerals  1 tablet Oral Q lunch   pantoprazole  40 mg Oral QAC breakfast   Continuous Infusions:  PRN Meds: acetaminophen **OR** acetaminophen, albuterol, magnesium hydroxide, methocarbamol, ondansetron **OR** ondansetron (ZOFRAN) IV, traZODone   Vital Signs    Vitals:   01/26/22 1956 01/27/22 0020 01/27/22 0453 01/27/22 0811  BP: 120/85 101/84 110/70 (!) 129/98  Pulse: (!) 103 94 95 (!) 102  Resp: '19 19 19 20  '$ Temp: 98.2 F (36.8 C) 97.6 F (36.4 C) 98.5 F (36.9 C)   TempSrc: Oral Oral Oral   SpO2: 95% 92% 94% 92%  Weight:      Height:       No intake or output data in the 24 hours ending 01/27/22 0826    01/24/2022    8:42 PM 01/24/2022    7:34 PM 12/01/2021    2:44 PM  Last 3 Weights  Weight (lbs) 143 lb 4.8 oz 125 lb 10.6 oz 126 lb  Weight (kg) 65 kg 57 kg 57.153 kg      Telemetry    Atrial fibrillation with CVR- Personally Reviewed  ECG    No new EKG to review- Personally Reviewed  Physical Exam   GEN: No acute distress.   Neck: No JVD Cardiac: Irregularly irregular no murmurs, rubs, or gallops.  Respiratory: Crackles at bases bilaterally GI: Soft, nontender, non-distended  MS: No edema; No deformity. Neuro:  Nonfocal  Psych: Normal affect   Labs    High Sensitivity  Troponin:   Recent Labs  Lab 01/24/22 2047 01/24/22 2326  TROPONINIHS 9 9      Chemistry Recent Labs  Lab 01/24/22 2047 01/25/22 0418  NA 128* 133*  K 4.4 4.0  CL 99 101  CO2 19* 24  GLUCOSE 122* 108*  BUN 20 18  CREATININE 0.86 0.95  CALCIUM 9.4 9.6  GFRNONAA >60 56*  ANIONGAP 10 8     Hematology Recent Labs  Lab 01/24/22 2047 01/25/22 0418  WBC 8.6 9.2  RBC 4.18 4.51  HGB 11.4* 12.4  HCT 36.9 39.3  MCV 88.3 87.1  MCH 27.3 27.5  MCHC 30.9 31.6  RDW 13.2 13.1  PLT 258 260    BNP Recent Labs  Lab 01/24/22 2047  BNP 419.8*     DDimer No results for input(s): "DDIMER" in the last 168 hours.   CHA2DS2-VASc Score = 5   This indicates a 7.2% annual risk of stroke. The patient's score is based upon: CHF History: 1 HTN History: 1 Diabetes History: 0 Stroke History: 0 Vascular Disease History: 0 Age Score: 2 Gender Score: 1      Radiology    ECHOCARDIOGRAM COMPLETE  Result Date: 01/26/2022    ECHOCARDIOGRAM REPORT   Patient Name:   Leslie Johnston Date of Exam: 01/26/2022 Medical Rec #:  [id number]         Height:       65.0 in Accession #:    [id number]        Weight:       143.3 lb Date of Birth:  25-Sep-1925         BSA:          1.717 m Patient Age:    89 years          BP:           117/89 mmHg Patient Gender: F                 HR:           90 bpm. Exam Location:  [facility] Procedure: 2D Echo, Cardiac Doppler and Color Doppler Indications:     Atrial Fibrillation  History:         Patient has prior history of Echocardiogram examinations. CHF,                  Arrythmias:Atrial Fibrillation; Risk Factors:Hypertension.  Sonographer:     Wenda Low Referring Phys:  [place] Phys: Ida Rogue MD IMPRESSIONS  1. Left ventricular ejection fraction, by estimation, is 60 to 65%. The left ventricle has normal function. The left ventricle has no regional wall motion abnormalities. There is mild left ventricular hypertrophy. Left  ventricular diastolic parameters are indeterminate.  2. Right ventricular systolic function is normal. The right ventricular size is mildly enlarged. There is mildly elevated pulmonary artery systolic pressure. The estimated right ventricular systolic pressure is 18.2 mmHg.  3. Right atrial size was moderately dilated.  4. The mitral valve is normal in structure. Mild mitral valve regurgitation. No evidence of mitral stenosis.  5. Tricuspid valve regurgitation is severe.  6. The aortic valve is tricuspid. Aortic valve regurgitation is not visualized. Aortic valve sclerosis is present, with no evidence of aortic valve stenosis.  7. The inferior vena cava is normal in size with greater than 50% respiratory variability, suggesting right atrial pressure of 3 mmHg. FINDINGS  Left Ventricle: Left ventricular ejection fraction, by estimation, is 60 to 65%. The left ventricle has normal function. The left ventricle has no regional wall motion abnormalities. The left ventricular internal cavity size was normal in size. There is  mild left ventricular hypertrophy. Left ventricular diastolic parameters are indeterminate. Right Ventricle: The right ventricular size is mildly enlarged. No increase in right ventricular wall thickness. Right ventricular systolic function is normal. There is mildly elevated pulmonary artery systolic pressure. The tricuspid regurgitant velocity is 2.42 m/s, and with an assumed right atrial pressure of 15 mmHg, the estimated right ventricular systolic pressure is 99.3 mmHg. Left Atrium: Left atrial size was normal in size. Right Atrium: Right atrial size was moderately dilated. Pericardium: There is no evidence of pericardial effusion. Mitral Valve: The mitral valve is normal in structure. Mild mitral valve regurgitation. No evidence of mitral valve stenosis. MV peak gradient, 6.2 mmHg. The mean mitral valve gradient is 3.0 mmHg. Tricuspid Valve: The tricuspid valve is normal in structure. Tricuspid  valve regurgitation is severe. No evidence of tricuspid stenosis. Aortic Valve: The aortic valve is tricuspid. Aortic valve regurgitation is not visualized. Aortic valve sclerosis is present, with no evidence of aortic valve stenosis. Aortic valve mean gradient measures 3.3 mmHg.  Aortic valve peak gradient measures 6.2  mmHg. Aortic valve area, by VTI measures 1.63 cm. Pulmonic Valve: The pulmonic valve was normal in structure. Pulmonic valve regurgitation is not visualized. No evidence of pulmonic stenosis. Aorta: The aortic root is normal in size and structure. Venous: The inferior vena cava is normal in size with greater than 50% respiratory variability, suggesting right atrial pressure of 3 mmHg. IAS/Shunts: No atrial level shunt detected by color flow Doppler.  LEFT VENTRICLE PLAX 2D LVIDd:         2.70 cm LVIDs:         1.70 cm LV PW:         1.00 cm LV IVS:        1.40 cm LVOT diam:     1.80 cm LV SV:         34 LV SV Index:   20 LVOT Area:     2.54 cm  RIGHT VENTRICLE RV Basal diam:  3.60 cm RV Mid diam:    3.40 cm RV S prime:     13.50 cm/s TAPSE (M-mode): 2.5 cm LEFT ATRIUM             Index        RIGHT ATRIUM           Index LA diam:        2.70 cm 1.57 cm/m   RA Area:     26.70 cm LA Vol (A2C):   54.0 ml 31.45 ml/m  RA Volume:   92.20 ml  53.70 ml/m LA Vol (A4C):   43.8 ml 25.51 ml/m LA Biplane Vol: 49.9 ml 29.07 ml/m  AORTIC VALVE                    PULMONIC VALVE AV Area (Vmax):    1.84 cm     PV Vmax:       0.78 m/s AV Area (Vmean):   1.57 cm     PV Peak grad:  2.4 mmHg AV Area (VTI):     1.63 cm AV Vmax:           124.00 cm/s AV Vmean:          86.067 cm/s AV VTI:            0.207 m AV Peak Grad:      6.2 mmHg AV Mean Grad:      3.3 mmHg LVOT Vmax:         89.67 cm/s LVOT Vmean:        53.000 cm/s LVOT VTI:          0.133 m LVOT/AV VTI ratio: 0.64  AORTA Ao Root diam: 3.40 cm Ao Asc diam:  3.30 cm MITRAL VALVE                TRICUSPID VALVE MV Area (PHT): 3.24 cm     TR Peak grad:   23.4  mmHg MV Area VTI:   2.16 cm     TR Vmax:        242.00 cm/s MV Peak grad:  6.2 mmHg MV Mean grad:  3.0 mmHg     SHUNTS MV Vmax:       1.25 m/s     Systemic VTI:  0.13 m MV Vmean:      70.2 cm/s    Systemic Diam: 1.80 cm MV Decel Time: 234 msec MV E velocity: 118.00 cm/s Ida Rogue MD Electronically signed by Ida Rogue MD  Signature Date/Time: 01/26/2022/3:45:26 PM    Final     Cardiac Studies  TTE 01/26/22 IMPRESSIONS    1. Left ventricular ejection fraction, by estimation, is 60 to 65%. The  left ventricle has normal function. The left ventricle has no regional  wall motion abnormalities. There is mild left ventricular hypertrophy.  Left ventricular diastolic parameters  are indeterminate.   2. Right ventricular systolic function is normal. The right ventricular  size is mildly enlarged. There is mildly elevated pulmonary artery  systolic pressure. The estimated right ventricular systolic pressure is  68.1 mmHg.   3. Right atrial size was moderately dilated.   4. The mitral valve is normal in structure. Mild mitral valve  regurgitation. No evidence of mitral stenosis.   5. Tricuspid valve regurgitation is severe.   6. The aortic valve is tricuspid. Aortic valve regurgitation is not  visualized. Aortic valve sclerosis is present, with no evidence of aortic  valve stenosis.   7. The inferior vena cava is normal in size with greater than 50%  respiratory variability, suggesting right atrial pressure of 3 mmHg.     TTE 02/11/20 1. Left ventricular ejection fraction, by estimation, is 60 to 65%. The  left ventricle has normal function. The left ventricle has no regional  wall motion abnormalities. There is moderate left ventricular hypertrophy.  Near cavity obliteration in  systole. Left ventricular diastolic parameters are consistent with Grade I  diastolic dysfunction (impaired relaxation).   2. Right ventricular systolic function is normal. The right ventricular  size is  normal. There is normal pulmonary artery systolic pressure. The  estimated right ventricular systolic pressure is 27.5 mmHg.   Patient Profile     89 y.o. female with a hx of hypertension, GERD, chronic low back pain, paroxysmal atrial fibrillation, allergic rhinitis, overactive bladder, osteoporosis, dementia, and stage III chronic kidney disease who is being seen 01/26/2022 for the evaluation of atrial fibrillation RVR at the request of Dr Priscella Mann.   Assessment & Plan    Atrial fibrillation RVR with a history of paroxysmal atrial fibrillation -Remains in atrial fibrillation heart rate 95 to 102 bpm -Continued on apixaban 2.5 mg twice daily for CHA2DS2-VASc score of at least 5 -Started on amiodarone 200 mg twice daily x 7 days and then 200 mg daily thereafter -Continue Cardizem CD 180 mg daily -Increase Lopressor to 37.5 mg twice daily for better heart rate control  -TSH normal at 1.05 -Continue on telemetry monitoring for now -If patient does not convert with oral amiodarone can consider outpatient cardioversion   Acute diastolic CHF -BNP 170.0 -Arrived with some shortness of breath and peripheral edema -Given IV Lasix in the emergency department with 1.3 L output in 24 hours -Started on low-dose furosemide 20 mg daily yesterday -likely secondary to atrial fibrillation RVR -She is net -2.8 L since admission -Will has crackles at both bases -Serum creatinine 0.95 and potassium 4.2 days ago. -She apparently got severe leg cramping on IV Lasix so we will increase p.o. Lasix to '20mg'$  twice daily -Repeat Bmet today -daily weight, I&O's and fluid restriction of less than 2L while in atrial fibrillation   Acute respiratory failure with hypoxia -Continue to titrate FiO2 to maintain oxygen saturations 92% or greater>> O2 sats 92 to 94% on 2 L nasal cannula this morning -Started on Lasix orally -She -2.8 L since admission -Continue to monitor urine output -Shortness of breath continues to  improve   Essential hypertension -BP mildly elevated at 129/98 mmHg this  morning was fine yesterday -Continue Cardizem CD 180 mg daily, lisinopril 20 mg daily -Increasing Lopressor to 37.5 mg twice daily for better heart rate control -Will stop lisinopril to allow more BP room for uptitrating beta-blocker for heart rate control  5.  Chest pain -She describes a tightness in her chest which could be related to CHF -Troponin has been normal but will recheck again today -2D echo this admission with normal LV function     I have spent a total of 35 minutes with patient reviewing 2D echo , telemetry, EKGs, labs and examining patient as well as establishing an assessment and plan that was discussed with the patient.  > 50% of time was spent in direct patient care.    For questions or updates, please contact [place] Please consult [email or web address] for contact info under        Signed, Fransico Him, MD  01/27/2022, 8:26 AM

## 2022-01-27 NOTE — Evaluation (Addendum)
Clinical/Bedside Swallow Evaluation Patient Details  Name: Leslie Johnston MRN: [id number] Date of Birth: 1925/05/30  Today's Date: 01/27/2022 Time: SLP Start Time (ACUTE ONLY): 1525 SLP Stop Time (ACUTE ONLY): 9449 SLP Time Calculation (min) (ACUTE ONLY): 38 min  Past Medical History:  Past Medical History:  Diagnosis Date   A-fib ([place])    Allergic rhinitis    Allergy    Chronic low back pain    GERD (gastroesophageal reflux disease)    Hypertension    Past Surgical History:  Past Surgical History:  Procedure Laterality Date   EYE SURGERY     cataract extraction- Right   KYPHOPLASTY N/A 11/26/2017   Procedure: [id number];  Surgeon: Hessie Knows, MD;  Location: [facility] ORS;  Service: Orthopedics;  Laterality: N/A;   HPI:  Pt is a 89 year old female admitted with Acute on chronic diastolic CHF, Acute respiratory failure with hypoxia after presenting to the ED with worsening dyspnea with associated worsening lower extremity edema.   PMH significant for ERD, GERD w/ PPI, hypertension, chronic low back pain, atrial fibrillation and allergic rhinitis.  maging: Portable chest ray showed small bilateral pleural effusions left greater than right with superimposed left basilar atelectasis or infiltrates and pulmonary hypertension.  The patient was given total of 60 mg IV Lasix as well as 10 mg of IV Cardizem.  She will be admitted to a cardiac telemetry bed for further evaluation and management.    Assessment / Plan / Recommendation  Clinical Impression   Pt seen for BSE today. Pt awake, sitting EOB. Family in room. Pt is HOH but could hear w/ HAs in place and supported by watching mouth of speaker. Pt followed all commands appropriately. Pt engaged appropriately and denied she had any difficulty swallowing at home.  On  O2 2L; afebrile; WBC wnl.  Pt appears to present w/ functional oropharyngeal phase swallowing w/ No oropharyngeal phase dysphagia noted, No neuromuscular deficits  noted. Family present and denied any difficulty swallowing w/ po's this afternoon, meals. Pt consumed po trials w/ No immediate, overt, clinical s/s of aspiration during po trials. Pt appears at reduced risk for aspiration following general aspiration precautions.  However, it is reported that pt may have some difficulty swallowing Pills w/ liquids -- pt endorsed difficulty swallowing and "getting strangled" only "every once in awhile". Family present in room agreed. Pt does have challenging factors that could impact her oropharyngeal swallowing to include deconditioning/weakness, hospitalization currently, Reflux, and advanced age. These factors can increase risk for aspiration, dysphagia as well as decreased oral intake overall.   During po trials, pt consumed sips of thin liquids w/ no overt coughing, decline in vocal quality, or change in respiratory presentation during/post trials. Oral phase appeared [facility] w/ timely bolus management and control of bolus propulsion for A-P transfer for swallowing. Oral clearing achieved w/ all trial consistencies.  OM Exam appeared [facility] w/ no unilateral weakness noted. Speech Clear, intelligible. Pt fed self w/ setup support.   Recommend continue a Regular consistency diet w/ well-Cut meats, moistened foods; Thin liquids -- monitor straw use, and pt should drink from a Cup if any coughing noted w/ straw use. Recommend general aspiration precautions, REFLUX precautions. Pills WHOLE in Puree for safer, easier swallowing IF pt is having any difficulty such as coughing/throat clearing when swallowing Pills w/ liquids.   Thorough Education given on swallowing Pills and Pills taken in Puree; food consistencies and easy to eat options; general aspiration and REFLUX precautions to pt and  Son/Dtr in room. NSG to reconsult if any new needs arise. NSG updated, agreed. MD updated. No further skilled ST services indicated at this time as pt appears at her Baseline. Family/pt  agreed. SLP Visit Diagnosis: Dysphagia, unspecified (R13.10)    Aspiration Risk   (reduced following general aspiration precautions)    Diet Recommendation   continue a Regular consistency diet w/ well-Cut meats, moistened foods; Thin liquids -- monitor straw use, and pt should drink from a Cup if any coughing noted w/ straw use. Recommend general aspiration precautions, REFLUX precautions.  Medication Administration: Whole meds with puree (as needed for ease and safety of swallowing)    Other  Recommendations Recommended Consults:  (n/a) Oral Care Recommendations: Oral care BID;Patient independent with oral care Other Recommendations:  (n/a)    Recommendations for follow up therapy are one component of a multi-disciplinary discharge planning process, led by the attending physician.  Recommendations may be updated based on patient status, additional functional criteria and insurance authorization.  Follow up Recommendations No SLP follow up      Assistance Recommended at Discharge  PRN  Functional Status Assessment Patient has had a recent decline in their functional status and demonstrates the ability to make significant improvements in function in a reasonable and predictable amount of time.  Frequency and Duration  (n/a)   (n/a)       Prognosis Prognosis for Safe Diet Advancement: Good Barriers to Reach Goals: Time post onset;Severity of deficits Barriers/Prognosis Comment: pt is HOH      Swallow Study   General Date of Onset: 01/24/22 HPI: Pt is a 89 year old female admitted with Acute on chronic diastolic CHF, Acute respiratory failure with hypoxia after presenting to the ED with worsening dyspnea with associated worsening lower extremity edema.   PMH significant for ERD, GERD w/ PPI, hypertension, chronic low back pain, atrial fibrillation and allergic rhinitis.  maging: Portable chest ray showed small bilateral pleural effusions left greater than right with superimposed left  basilar atelectasis or infiltrates and pulmonary hypertension.  The patient was given total of 60 mg IV Lasix as well as 10 mg of IV Cardizem.  She will be admitted to a cardiac telemetry bed for further evaluation and management. Type of Study: Bedside Swallow Evaluation Previous Swallow Assessment: none Diet Prior to this Study: Regular;Thin liquids Temperature Spikes Noted: No (wbc 9.2) Respiratory Status: Nasal cannula (2L) History of Recent Intubation: No Behavior/Cognition: Alert;Cooperative;Pleasant mood;Requires cueing (d/t HOH) Oral Cavity Assessment: Within Functional Limits Oral Care Completed by SLP: Recent completion by staff Oral Cavity - Dentition:  (Dentures) Vision: Functional for self-feeding Self-Feeding Abilities: Able to feed self Patient Positioning: Upright in bed (sitting EOB -- family does not let her eat in bed.) Baseline Vocal Quality: Normal Volitional Cough: Strong Volitional Swallow: Able to elicit    Oral/Motor/Sensory Function Overall Oral Motor/Sensory Function: Within functional limits   Ice Chips Ice chips: Not tested   Thin Liquid Thin Liquid: Within functional limits Presentation: Self Fed;Straw (mostly uses cup at home per family but pt stated she liked to use a straw; 5+ trials) Other Comments: family in room noted no deficits w/ po intake this afternoon    Nectar Thick Nectar Thick Liquid: Not tested   Honey Thick Honey Thick Liquid: Not tested   Puree Puree: Not tested   Solid     Solid: Not tested         Orinda Kenner, MS, CCC-SLP Speech Language Pathologist Rehab Services; [facility] [phone] (ascom) Draysen Weygandt 01/27/2022,4:04 PM

## 2022-01-27 NOTE — Progress Notes (Signed)
PROGRESS NOTE    Leslie Johnston  MVE:[id number] DOB: 1925-07-17 DOA: 01/24/2022 PCP: Olin Hauser, DO    Brief Narrative:  89 y.o. Caucasian female with medical history significant for GERD, hypertension, chronic low back pain, atrial fibrillation and allergic rhinitis, who presented to the emergency room with a CC of worsening dyspnea with associated worsening lower extremity edema.  She has orthopnea without significant worsening and admits to dyspnea on exertion.  No fever or chills.  No worsening cough or wheezing.  No nausea or vomiting or abdominal pain.  No dysuria, oliguria or hematuria or flank pain.  No bleeding diathesis.  Patient volume status is improved but has had difficulty with atrial fibrillation rate controlled.  Cardiology consulted.  Plan for addition of amiodarone 200 mg twice daily in addition to AV nodal blocking agents.  Lasix 20 mg p.o. daily after good IV diuresis.    1/27: Rate control improved but not yet at goal.  Cardiology evaluated.  Made adjustments in medications for better rate control.  Assessment & Plan:   Principal Problem:   Acute diastolic CHF (congestive heart failure) (HCC) Active Problems:   Acute respiratory failure with hypoxia (HCC)   Atrial fibrillation with RVR (HCC)   GERD without esophagitis   Essential hypertension   Acute on chronic congestive heart failure (HCC)  * Acute on chronic diastolic CHF (congestive heart failure) (HCC) Suspect mild diastolic congestive heart failure.  Patient's respiratory status appears to be improving since admission.  Last echocardiogram showed grade 1 diastolic dysfunction with normal ejection fraction Patient is diuresed effectively Plan: Continue Lasix 20 mg orally daily Daily weights, strict I's and O's Defer repeat echocardiogram for now   Acute respiratory failure with hypoxia ([place]) Likely related to fluid overload state.  Continue oxygen via nasal cannula.  Wean as tolerated.   Encourage I-S use     Atrial fibrillation with RVR (HCC) Rate control is been difficult to achieve Plan: Continue Cardizem CD 180 mg daily Increase Lopressor to 37.5 mg twice daily for better rate control Continue amiodarone 200 mg twice daily Continue apixaban for CHA2DS2-VASc of at least 5 Telemetry monitoring Cardiology to consider outpatient cardioversion Anticipate discharge home tomorrow for control improved  Essential hypertension Antihypertensive regimen as above   GERD without esophagitis PTA PPI    DVT prophylaxis: Eliquis Code Status: DNR Family Communication: Son at bedside 1/25, daughter bedside 1/26, 2 daughters at bedside 1/27 Disposition Plan: Status is: Inpatient Remains inpatient appropriate because: Mild decompensated diastolic heart failure, atrial fibrillation with rapid ventricular response   Level of care: Telemetry Cardiac  Consultants:  None  Procedures:  None  Antimicrobials: None   Subjective: Seen and examined.  Patient sitting up in bed.  No distress  Objective: Vitals:   01/27/22 0020 01/27/22 0453 01/27/22 0811 01/27/22 1200  BP: 101/84 110/70 (!) 129/98 90/82  Pulse: 94 95 (!) 102 (!) 112  Resp: '19 19 20 '$ (!) 21  Temp: 97.6 F (36.4 C) 98.5 F (36.9 C)  98.4 F (36.9 C)  TempSrc: Oral Oral    SpO2: 92% 94% 92% 90%  Weight:      Height:        Intake/Output Summary (Last 24 hours) at 01/27/2022 1244 Last data filed at 01/27/2022 1039 Gross per 24 hour  Intake 120 ml  Output --  Net 120 ml   Filed Weights   01/24/22 1934 01/24/22 2042  Weight: 57 kg 65 kg    Examination:  General exam:  No acute distress.  Frail-appearing Respiratory system: Decreased at bases.  Normal work of breathing.  Room air Cardiovascular system: S1-S2, tachycardic, irregular rhythm, no murmurs, no pedal edema Gastrointestinal system: Thin, soft, NT/ND, normal bowel sounds Central nervous system: Alert.  Oriented x 1.  No focal  deficits Extremities: Symmetric 5 x 5 power. Skin: No rashes, lesions or ulcers Psychiatry: Judgement and insight appear impaired. Mood & affect confused.     Data Reviewed: I have personally reviewed following labs and imaging studies  CBC: Recent Labs  Lab 01/24/22 2047 01/25/22 0418  WBC 8.6 9.2  HGB 11.4* 12.4  HCT 36.9 39.3  MCV 88.3 87.1  PLT 258 342   Basic Metabolic Panel: Recent Labs  Lab 01/24/22 2047 01/25/22 0418 01/27/22 0854  NA 128* 133* 133*  K 4.4 4.0 3.9  CL 99 101 96*  CO2 19* 24 29  GLUCOSE 122* 108* 98  BUN '20 18 18  '$ CREATININE 0.86 0.95 0.97  CALCIUM 9.4 9.6 9.2   GFR: Estimated Creatinine Clearance: 31.9 mL/min (by C-G formula based on SCr of 0.97 mg/dL). Liver Function Tests: No results for input(s): "AST", "ALT", "ALKPHOS", "BILITOT", "PROT", "ALBUMIN" in the last 168 hours. No results for input(s): "LIPASE", "AMYLASE" in the last 168 hours. No results for input(s): "AMMONIA" in the last 168 hours. Coagulation Profile: No results for input(s): "INR", "PROTIME" in the last 168 hours. Cardiac Enzymes: No results for input(s): "CKTOTAL", "CKMB", "CKMBINDEX", "TROPONINI" in the last 168 hours. BNP (last 3 results) No results for input(s): "PROBNP" in the last 8760 hours. HbA1C: No results for input(s): "HGBA1C" in the last 72 hours. CBG: No results for input(s): "GLUCAP" in the last 168 hours. Lipid Profile: No results for input(s): "CHOL", "HDL", "LDLCALC", "TRIG", "CHOLHDL", "LDLDIRECT" in the last 72 hours. Thyroid Function Tests: Recent Labs    01/25/22 0418  TSH 1.050   Anemia Panel: No results for input(s): "VITAMINB12", "FOLATE", "FERRITIN", "TIBC", "IRON", "RETICCTPCT" in the last 72 hours. Sepsis Labs: No results for input(s): "PROCALCITON", "LATICACIDVEN" in the last 168 hours.  No results found for this or any previous visit (from the past 240 hour(s)).       Radiology Studies: ECHOCARDIOGRAM COMPLETE  Result  Date: 01/26/2022    ECHOCARDIOGRAM REPORT   Patient Name:   Leslie Johnston Date of Exam: 01/26/2022 Medical Rec #:  [id number]         Height:       65.0 in Accession #:    [id number]        Weight:       143.3 lb Date of Birth:  02-13-1925         BSA:          1.717 m Patient Age:    82 years          BP:           117/89 mmHg Patient Gender: F                 HR:           90 bpm. Exam Location:  [facility] Procedure: 2D Echo, Cardiac Doppler and Color Doppler Indications:     Atrial Fibrillation  History:         Patient has prior history of Echocardiogram examinations. CHF,                  Arrythmias:Atrial Fibrillation; Risk Factors:Hypertension.  Sonographer:     Wenda Low Referring Phys:  [place] Phys: Ida Rogue MD IMPRESSIONS  1. Left ventricular ejection fraction, by estimation, is 60 to 65%. The left ventricle has normal function. The left ventricle has no regional wall motion abnormalities. There is mild left ventricular hypertrophy. Left ventricular diastolic parameters are indeterminate.  2. Right ventricular systolic function is normal. The right ventricular size is mildly enlarged. There is mildly elevated pulmonary artery systolic pressure. The estimated right ventricular systolic pressure is 47.0 mmHg.  3. Right atrial size was moderately dilated.  4. The mitral valve is normal in structure. Mild mitral valve regurgitation. No evidence of mitral stenosis.  5. Tricuspid valve regurgitation is severe.  6. The aortic valve is tricuspid. Aortic valve regurgitation is not visualized. Aortic valve sclerosis is present, with no evidence of aortic valve stenosis.  7. The inferior vena cava is normal in size with greater than 50% respiratory variability, suggesting right atrial pressure of 3 mmHg. FINDINGS  Left Ventricle: Left ventricular ejection fraction, by estimation, is 60 to 65%. The left ventricle has normal function. The left ventricle has no regional wall motion  abnormalities. The left ventricular internal cavity size was normal in size. There is  mild left ventricular hypertrophy. Left ventricular diastolic parameters are indeterminate. Right Ventricle: The right ventricular size is mildly enlarged. No increase in right ventricular wall thickness. Right ventricular systolic function is normal. There is mildly elevated pulmonary artery systolic pressure. The tricuspid regurgitant velocity is 2.42 m/s, and with an assumed right atrial pressure of 15 mmHg, the estimated right ventricular systolic pressure is 96.2 mmHg. Left Atrium: Left atrial size was normal in size. Right Atrium: Right atrial size was moderately dilated. Pericardium: There is no evidence of pericardial effusion. Mitral Valve: The mitral valve is normal in structure. Mild mitral valve regurgitation. No evidence of mitral valve stenosis. MV peak gradient, 6.2 mmHg. The mean mitral valve gradient is 3.0 mmHg. Tricuspid Valve: The tricuspid valve is normal in structure. Tricuspid valve regurgitation is severe. No evidence of tricuspid stenosis. Aortic Valve: The aortic valve is tricuspid. Aortic valve regurgitation is not visualized. Aortic valve sclerosis is present, with no evidence of aortic valve stenosis. Aortic valve mean gradient measures 3.3 mmHg. Aortic valve peak gradient measures 6.2  mmHg. Aortic valve area, by VTI measures 1.63 cm. Pulmonic Valve: The pulmonic valve was normal in structure. Pulmonic valve regurgitation is not visualized. No evidence of pulmonic stenosis. Aorta: The aortic root is normal in size and structure. Venous: The inferior vena cava is normal in size with greater than 50% respiratory variability, suggesting right atrial pressure of 3 mmHg. IAS/Shunts: No atrial level shunt detected by color flow Doppler.  LEFT VENTRICLE PLAX 2D LVIDd:         2.70 cm LVIDs:         1.70 cm LV PW:         1.00 cm LV IVS:        1.40 cm LVOT diam:     1.80 cm LV SV:         34 LV SV Index:    20 LVOT Area:     2.54 cm  RIGHT VENTRICLE RV Basal diam:  3.60 cm RV Mid diam:    3.40 cm RV S prime:     13.50 cm/s TAPSE (M-mode): 2.5 cm LEFT ATRIUM             Index        RIGHT ATRIUM  Index LA diam:        2.70 cm 1.57 cm/m   RA Area:     26.70 cm LA Vol (A2C):   54.0 ml 31.45 ml/m  RA Volume:   92.20 ml  53.70 ml/m LA Vol (A4C):   43.8 ml 25.51 ml/m LA Biplane Vol: 49.9 ml 29.07 ml/m  AORTIC VALVE                    PULMONIC VALVE AV Area (Vmax):    1.84 cm     PV Vmax:       0.78 m/s AV Area (Vmean):   1.57 cm     PV Peak grad:  2.4 mmHg AV Area (VTI):     1.63 cm AV Vmax:           124.00 cm/s AV Vmean:          86.067 cm/s AV VTI:            0.207 m AV Peak Grad:      6.2 mmHg AV Mean Grad:      3.3 mmHg LVOT Vmax:         89.67 cm/s LVOT Vmean:        53.000 cm/s LVOT VTI:          0.133 m LVOT/AV VTI ratio: 0.64  AORTA Ao Root diam: 3.40 cm Ao Asc diam:  3.30 cm MITRAL VALVE                TRICUSPID VALVE MV Area (PHT): 3.24 cm     TR Peak grad:   23.4 mmHg MV Area VTI:   2.16 cm     TR Vmax:        242.00 cm/s MV Peak grad:  6.2 mmHg MV Mean grad:  3.0 mmHg     SHUNTS MV Vmax:       1.25 m/s     Systemic VTI:  0.13 m MV Vmean:      70.2 cm/s    Systemic Diam: 1.80 cm MV Decel Time: 234 msec MV E velocity: 118.00 cm/s Ida Rogue MD Electronically signed by Ida Rogue MD Signature Date/Time: 01/26/2022/3:45:26 PM    Final         Scheduled Meds:  amiodarone  200 mg Oral BID   [START ON 02/03/2022] amiodarone  200 mg Oral Daily   apixaban  2.5 mg Oral BID   diltiazem  180 mg Oral Daily   famotidine  20 mg Oral BID   furosemide  20 mg Oral BID   loratadine  10 mg Oral Daily   metoprolol tartrate  37.5 mg Oral BID   mirtazapine  7.5 mg Oral QHS   multivitamin with minerals  1 tablet Oral Q lunch   pantoprazole  40 mg Oral QAC breakfast   Continuous Infusions:   LOS: 3 days       Sidney Ace, MD [facility]   If 7PM-7AM, please contact  night-coverage  01/27/2022, 12:44 PM

## 2022-01-27 NOTE — Evaluation (Signed)
Occupational Therapy Evaluation Patient Details Name: Leslie Johnston MRN: [id number] DOB: 1925-03-02 Today's Date: 01/27/2022   History of Present Illness Pt is a 89 year old female admitted with Acute on chronic diastolic CHF, Acute respiratory failure with hypoxia after presenting to the ED with worsening dyspnea with associated worsening lower extremity edema PMH significant for ERD, hypertension, chronic low back pain, atrial fibrillation and allergic rhinitis   Clinical Impression   Chart reviewed, pt greeted in room with daughters present, agreeable to OT evaluation. Pt is extremely HOH. Pt is alert and oriented x4, increased time for processing with fair awareness. PTA pt has clsoe to 24/7 supervision (daughter will step out during the day to run an errand) and someone sleeps at her house at night, amb with SPC limited household distances, assist for all IADLs, performs dressing, grooming, feeding tasks with supervision-MOD I, assist for bathing tasks. Pt presents with deficits in strength, endurance, activity tolerance, balance affecting safe and optimal ADL completion. Recommend HHOT following discharge to address functional deficits. Of note, pt [place] on her face, oxygen unhooked at start of session, spo2 between 90-93 at rest and after mobility to bathroom however pt c/o SOB and is dyspneic. Improved with PLB. Pt is left eating breakfast at edge of bed, RN aware of pt status. OT will follow acutely.      Recommendations for follow up therapy are one component of a multi-disciplinary discharge planning process, led by the attending physician.  Recommendations may be updated based on patient status, additional functional criteria and insurance authorization.   Follow Up Recommendations  Home health OT     Assistance Recommended at Discharge Frequent or constant Supervision/Assistance  Patient can return home with the following A little help with bathing/dressing/bathroom;A little help  with walking and/or transfers;Direct supervision/assist for financial management;Assistance with cooking/housework;Assist for transportation;Direct supervision/assist for medications management;Help with stairs or ramp for entrance    Functional Status Assessment  Patient has had a recent decline in their functional status and demonstrates the ability to make significant improvements in function in a reasonable and predictable amount of time.  Equipment Recommendations  None recommended by OT;Other (comment) (pt has recommended equipment)    Recommendations for Other Services       Precautions / Restrictions Precautions Precautions: Fall Precaution Comments: watch spo2 Restrictions Weight Bearing Restrictions: No      Mobility Bed Mobility Overal bed mobility: Modified Independent             General bed mobility comments: use of bed features    Transfers Overall transfer level: Needs assistance Equipment used: Straight cane Transfers: Sit to/from Stand Sit to Stand: Min guard, Min assist           General transfer comment: CGA off of low bed, CGA-MIN A off of toilet      Balance Overall balance assessment: Needs assistance Sitting-balance support: Feet supported Sitting balance-Leahy Scale: Good     Standing balance support: Single extremity supported Standing balance-Leahy Scale: Fair                             ADL either performed or assessed with clinical judgement   ADL Overall ADL's : Needs assistance/impaired Eating/Feeding: Set up;Sitting   Grooming: Wash/dry hands;Supervision/safety;Min guard;Standing Grooming Details (indicate cue type and reason): sink level with spc         Upper Body Dressing : Minimal assistance;Sitting Upper Body Dressing Details (indicate cue type  and reason): house coat Lower Body Dressing: Moderate assistance   Toilet Transfer: Min guard;Ambulation;Minimal assistance Toilet Transfer Details (indicate  cue type and reason): with SPC, anticipate improved performance with RW Toileting- Clothing Manipulation and Hygiene: Supervision/safety;Sitting/lateral lean;Minimal assistance Toileting - Clothing Manipulation Details (indicate cue type and reason): after contient void, MIN A for managing brief     Functional mobility during ADLs: Supervision/safety;Min guard;Cane (approx 30' in room 2 attempts, intermittent vcs for safety)       Vision Baseline Vision/History: 6 Macular Degeneration Patient Visual Report: No change from baseline       Perception     Praxis      Pertinent Vitals/Pain Pain Assessment Pain Assessment: No/denies pain     Hand Dominance     Extremity/Trunk Assessment Upper Extremity Assessment Upper Extremity Assessment: Generalized weakness   Lower Extremity Assessment Lower Extremity Assessment: Generalized weakness   Cervical / Trunk Assessment Cervical / Trunk Assessment: Kyphotic   Communication Communication Communication: HOH   Cognition Arousal/Alertness: Awake/alert Behavior During Therapy: WFL for tasks assessed/performed Overall Cognitive Status: Within Functional Limits for tasks assessed Area of Impairment: Safety/judgement, Awareness, Problem solving                         Safety/Judgement: Decreased awareness of deficits Awareness: Emergent Problem Solving: Requires verbal cues, Requires tactile cues General Comments: family endorses this is pt current baseline, pt plesaant and agreeable, follows one step directions approrpiately     General Comments  HR up to 112 with amb, spo2 >90% on RA pre/post mobility, pt dyspenic with exertion    Exercises Other Exercises Other Exercises: edu pt and daugthers [name] and Dee: role of OT, role of OT, role of rehab, discharge recommendations, use of gait belt with amb if pt uses spc, energy conservation techniques, questions regarding sodium/fluid restriction within scope   Shoulder  Instructions      Home Living Family/patient expects to be discharged to:: Private residence Living Arrangements: Alone Available Help at Discharge: Family;Available 24 hours/day (daugthers stay with her during the day/rotate out who stays with her at night) Type of Home: House Home Access: Stairs to enter [employer] of Steps: 4 Entrance Stairs-Rails: Left Home Layout: One level     Bathroom Shower/Tub: Walk-in shower;Tub/shower unit   Bathroom Toilet: Standard     Home Equipment: Conservation officer, nature (2 wheels);Cane - single point;Grab bars - tub/shower;BSC/3in1;Shower seat;Hand held shower head          Prior Functioning/Environment Prior Level of Function : Needs assist       Physical Assist : Mobility (physical);ADLs (physical)   ADLs (physical): IADLs;Bathing Mobility Comments: amb with SPC limited household distances (to bathroom, around house then with seated rest break) ADLs Comments: dressing, grooming, feeding, toileting with supervision-MOD I, bathing on shower chair with assist from daugther; assist for all IADLs from family        OT Problem List: Decreased strength;Decreased activity tolerance;Impaired balance (sitting and/or standing);Decreased safety awareness;Decreased knowledge of use of DME or AE;Decreased cognition      OT Treatment/Interventions: Self-care/ADL training;Visual/perceptual remediation/compensation;Patient/family education;Therapeutic exercise;Balance training;Therapeutic activities;DME and/or AE instruction;Energy conservation    OT Goals(Current goals can be found in the care plan section) Acute Rehab OT Goals Patient Stated Goal: return home OT Goal Formulation: With patient/family Time For Goal Achievement: 02/10/22 Potential to Achieve Goals: Good  OT Frequency: Min 2X/week    Co-evaluation  AM-PAC OT "6 Clicks" Daily Activity     Outcome Measure Help from another person eating meals?: None Help from  another person taking care of personal grooming?: A Little Help from another person toileting, which includes using toliet, bedpan, or urinal?: A Little Help from another person bathing (including washing, rinsing, drying)?: A Little Help from another person to put on and taking off regular upper body clothing?: None Help from another person to put on and taking off regular lower body clothing?: A Little 6 Click Score: 20   End of Session Equipment Utilized During Treatment: Other (comment) [facility]) Nurse Communication: Mobility status;Other (comment) (vital signs)  Activity Tolerance: Patient tolerated treatment well Patient left: Other (comment) (at edge of bed with daugthers present)  OT Visit Diagnosis: Unsteadiness on feet (R26.81)                Time: 1021-1173 OT Time Calculation (min): 28 min Charges:  OT General Charges $OT Visit: 1 Visit OT Evaluation $OT Eval Moderate Complexity: 1 Mod Shanon Payor, OTD OTR/L  01/27/22, 9:45 AM

## 2022-01-28 DIAGNOSIS — I5031 Acute diastolic (congestive) heart failure: Secondary | ICD-10-CM | POA: Diagnosis not present

## 2022-01-28 DIAGNOSIS — I4891 Unspecified atrial fibrillation: Secondary | ICD-10-CM | POA: Diagnosis not present

## 2022-01-28 DIAGNOSIS — I10 Essential (primary) hypertension: Secondary | ICD-10-CM | POA: Diagnosis not present

## 2022-01-28 DIAGNOSIS — I5033 Acute on chronic diastolic (congestive) heart failure: Secondary | ICD-10-CM | POA: Diagnosis not present

## 2022-01-28 LAB — BASIC METABOLIC PANEL
Anion gap: 5 (ref 5–15)
BUN: 27 mg/dL — ABNORMAL HIGH (ref 8–23)
CO2: 28 mmol/L (ref 22–32)
Calcium: 9.3 mg/dL (ref 8.9–10.3)
Chloride: 100 mmol/L (ref 98–111)
Creatinine, Ser: 1.08 mg/dL — ABNORMAL HIGH (ref 0.44–1.00)
GFR, Estimated: 48 mL/min — ABNORMAL LOW (ref >60)
Glucose, Bld: 105 mg/dL — ABNORMAL HIGH (ref 70–99)
Potassium: 3.9 mmol/L (ref 3.5–5.1)
Sodium: 133 mmol/L — ABNORMAL LOW (ref 135–145)

## 2022-01-28 LAB — TROPONIN I (HIGH SENSITIVITY)
Troponin I (High Sensitivity): 14 ng/L (ref <18)
Troponin I (High Sensitivity): 15 ng/L (ref <18)

## 2022-01-28 MED ORDER — METOPROLOL TARTRATE 50 MG PO TABS
50.0000 mg | ORAL_TABLET | Freq: Two times a day (BID) | ORAL | Status: DC
  Administered 2022-01-28 – 2022-01-29 (×3): 50 mg via ORAL
  Filled 2022-01-28 (×2): qty 1

## 2022-01-28 NOTE — Progress Notes (Signed)
PROGRESS NOTE    Leslie Johnston  ZJI:[id number] DOB: 09-06-1925 DOA: 01/24/2022 PCP: Olin Hauser, DO    Brief Narrative:  89 y.o. Caucasian female with medical history significant for GERD, hypertension, chronic low back pain, atrial fibrillation and allergic rhinitis, who presented to the emergency room with a CC of worsening dyspnea with associated worsening lower extremity edema.  She has orthopnea without significant worsening and admits to dyspnea on exertion.  No fever or chills.  No worsening cough or wheezing.  No nausea or vomiting or abdominal pain.  No dysuria, oliguria or hematuria or flank pain.  No bleeding diathesis.  Patient volume status is improved but has had difficulty with atrial fibrillation rate controlled.  Cardiology consulted.  Plan for addition of amiodarone 200 mg twice daily in addition to AV nodal blocking agents.  Lasix 20 mg p.o. daily after good IV diuresis.    1/27: Rate control improved but not yet at goal.  Cardiology evaluated.  Made adjustments in medications for better rate control.  Assessment & Plan:   Principal Problem:   Acute diastolic CHF (congestive heart failure) (HCC) Active Problems:   Acute respiratory failure with hypoxia (HCC)   Atrial fibrillation with RVR (HCC)   GERD without esophagitis   Essential hypertension   Acute on chronic congestive heart failure (HCC)  * Acute on chronic diastolic CHF (congestive heart failure) (HCC) Suspect mild diastolic congestive heart failure.  Patient's respiratory status appears to be improving since admission.  Last echocardiogram showed grade 1 diastolic dysfunction with normal ejection fraction Patient is diuresed effectively Plan: Continue Lasix 20 mg orally BID Daily weights, strict I's and O's Defer repeat echocardiogram for now   Acute respiratory failure with hypoxia ([place]) Likely related to fluid overload state.  Continue oxygen via nasal cannula.  Wean as tolerated.   Encourage I-S use.  Ambulated without desaturation     Atrial fibrillation with RVR (HCC) Rate control is been difficult to achieve Plan: Continue Cardizem CD 180 mg daily Increase Lopressor to '50mg'$  twice daily for better rate control Continue amiodarone 200 mg twice daily Continue apixaban for CHA2DS2-VASc of at least 5 Telemetry monitoring Cardiology to consider outpatient cardioversion Anticipate discharge home tomorrow for control improved  Essential hypertension Antihypertensive regimen as above   GERD without esophagitis PTA PPI    DVT prophylaxis: Eliquis Code Status: DNR Family Communication: Son at bedside 1/25, daughter bedside 1/26, 2 daughters at bedside 1/27, 1/28 Disposition Plan: Status is: Inpatient Remains inpatient appropriate because: Mild decompensated diastolic heart failure, atrial fibrillation with rapid ventricular response   Level of care: Telemetry Cardiac  Consultants:  None  Procedures:  None  Antimicrobials: None   Subjective: Seen and examined.  Patient sitting up in bed.  No distress  Objective: Vitals:   01/28/22 1258 01/28/22 1259 01/28/22 1300 01/28/22 1443  BP:      Pulse:      Resp:      Temp:      TempSrc:      SpO2: 94% 95% 94%   Weight:    58.8 kg  Height:        Intake/Output Summary (Last 24 hours) at 01/28/2022 1648 Last data filed at 01/28/2022 0700 Gross per 24 hour  Intake 0 ml  Output --  Net 0 ml   Filed Weights   01/24/22 1934 01/24/22 2042 01/28/22 1443  Weight: 57 kg 65 kg 58.8 kg    Examination:  General exam: No acute distress.  Appears frail Respiratory system: crackles at bases.  Normal work of breathing.  Room air Cardiovascular system: S1-S2, tachycardic, irregular rhythm, no murmurs, no pedal edema Gastrointestinal system: Thin, soft, NT/ND, normal bowel sounds Central nervous system: Alert.  Oriented x 1.  No focal deficits Extremities: Symmetric 5 x 5 power. Skin: No rashes, lesions or  ulcers Psychiatry: Judgement and insight appear impaired. Mood & affect confused.     Data Reviewed: I have personally reviewed following labs and imaging studies  CBC: Recent Labs  Lab 01/24/22 2047 01/25/22 0418  WBC 8.6 9.2  HGB 11.4* 12.4  HCT 36.9 39.3  MCV 88.3 87.1  PLT 258 470   Basic Metabolic Panel: Recent Labs  Lab 01/24/22 2047 01/25/22 0418 01/27/22 0854 01/28/22 0602  NA 128* 133* 133* 133*  K 4.4 4.0 3.9 3.9  CL 99 101 96* 100  CO2 19* '24 29 28  '$ GLUCOSE 122* 108* 98 105*  BUN '20 18 18 '$ 27*  CREATININE 0.86 0.95 0.97 1.08*  CALCIUM 9.4 9.6 9.2 9.3   GFR: Estimated Creatinine Clearance: 28.7 mL/min (A) (by C-G formula based on SCr of 1.08 mg/dL (H)). Liver Function Tests: No results for input(s): "AST", "ALT", "ALKPHOS", "BILITOT", "PROT", "ALBUMIN" in the last 168 hours. No results for input(s): "LIPASE", "AMYLASE" in the last 168 hours. No results for input(s): "AMMONIA" in the last 168 hours. Coagulation Profile: No results for input(s): "INR", "PROTIME" in the last 168 hours. Cardiac Enzymes: No results for input(s): "CKTOTAL", "CKMB", "CKMBINDEX", "TROPONINI" in the last 168 hours. BNP (last 3 results) No results for input(s): "PROBNP" in the last 8760 hours. HbA1C: No results for input(s): "HGBA1C" in the last 72 hours. CBG: No results for input(s): "GLUCAP" in the last 168 hours. Lipid Profile: No results for input(s): "CHOL", "HDL", "LDLCALC", "TRIG", "CHOLHDL", "LDLDIRECT" in the last 72 hours. Thyroid Function Tests: No results for input(s): "TSH", "T4TOTAL", "FREET4", "T3FREE", "THYROIDAB" in the last 72 hours.  Anemia Panel: No results for input(s): "VITAMINB12", "FOLATE", "FERRITIN", "TIBC", "IRON", "RETICCTPCT" in the last 72 hours. Sepsis Labs: No results for input(s): "PROCALCITON", "LATICACIDVEN" in the last 168 hours.  No results found for this or any previous visit (from the past 240 hour(s)).       Radiology  Studies: No results found.      Scheduled Meds:  amiodarone  200 mg Oral BID   [START ON 02/03/2022] amiodarone  200 mg Oral Daily   apixaban  2.5 mg Oral BID   diltiazem  180 mg Oral Daily   famotidine  20 mg Oral BID   furosemide  20 mg Oral BID   loratadine  10 mg Oral Daily   metoprolol tartrate  50 mg Oral BID   mirtazapine  7.5 mg Oral QHS   multivitamin with minerals  1 tablet Oral Q lunch   pantoprazole  40 mg Oral QAC breakfast   Continuous Infusions:   LOS: 4 days       Sidney Ace, MD [facility]   If 7PM-7AM, please contact night-coverage  01/28/2022, 4:48 PM

## 2022-01-28 NOTE — Progress Notes (Signed)
SATURATION QUALIFICATIONS: (This note is used to comply with regulatory documentation for home oxygen)  Patient Saturations on Room Air at Rest = 94%  Patient Saturations on Room Air while Ambulating = 91%  Patient Saturations on 2 Liters of oxygen while Ambulating = 94%  Please briefly explain why patient needs home oxygen:

## 2022-01-28 NOTE — Progress Notes (Signed)
Rounding Note    Patient Name: Leslie Johnston Date of Encounter: 01/28/2022  [place] Cardiologist: Ida Rogue, MD   Subjective   Patient seen on a.m. rounds.  Remains in atrial fibrillation with heart rates in the 90s to low 100s.  Shortness of breath is improving she remains on 2 L of O2 via nasal cannula and continues with intermittent chest tightness at times.  Patient and family are requesting discharge today.  Inpatient Medications    Scheduled Meds:  amiodarone  200 mg Oral BID   [START ON 02/03/2022] amiodarone  200 mg Oral Daily   apixaban  2.5 mg Oral BID   diltiazem  180 mg Oral Daily   famotidine  20 mg Oral BID   furosemide  20 mg Oral BID   loratadine  10 mg Oral Daily   metoprolol tartrate  50 mg Oral BID   mirtazapine  7.5 mg Oral QHS   multivitamin with minerals  1 tablet Oral Q lunch   pantoprazole  40 mg Oral QAC breakfast   Continuous Infusions:  PRN Meds: acetaminophen **OR** acetaminophen, albuterol, magnesium hydroxide, methocarbamol, ondansetron **OR** ondansetron (ZOFRAN) IV, traZODone   Vital Signs    Vitals:   01/27/22 1200 01/27/22 1550 01/27/22 2115 01/28/22 0019  BP: 90/82 (!) 141/97 (!) 160/108 124/84  Pulse: (!) 112 99 89 (!) 102  Resp: (!) '21 18 18 18  '$ Temp: 98.4 F (36.9 C) 97.8 F (36.6 C) 97.8 F (36.6 C) 98 F (36.7 C)  TempSrc:   Oral   SpO2: 90% 95% 92% 94%  Weight:      Height:        Intake/Output Summary (Last 24 hours) at 01/28/2022 0954 Last data filed at 01/28/2022 0700 Gross per 24 hour  Intake 600 ml  Output 0 ml  Net 600 ml      01/24/2022    8:42 PM 01/24/2022    7:34 PM 12/01/2021    2:44 PM  Last 3 Weights  Weight (lbs) 143 lb 4.8 oz 125 lb 10.6 oz 126 lb  Weight (kg) 65 kg 57 kg 57.153 kg      Telemetry    Atrial fibrillation with rates of 80-1 10- Personally Reviewed  ECG    No new tracings- Personally Reviewed  Physical Exam   GEN: No acute distress.   Neck: No  JVD Cardiac: Irregularly irregular, no murmurs, rubs, or gallops.  Respiratory: Crackles to the bilateral bases to auscultation bilaterally.  Respirations are unlabored at rest on 2 L of O2 via nasal cannula GI: Soft, nontender, non-distended  MS: No edema; No deformity. Neuro:  Nonfocal  Psych: Normal affect   Labs    High Sensitivity Troponin:   Recent Labs  Lab 01/24/22 2047 01/24/22 2326  TROPONINIHS 9 9     Chemistry Recent Labs  Lab 01/25/22 0418 01/27/22 0854 01/28/22 0602  NA 133* 133* 133*  K 4.0 3.9 3.9  CL 101 96* 100  CO2 '24 29 28  '$ GLUCOSE 108* 98 105*  BUN 18 18 27*  CREATININE 0.95 0.97 1.08*  CALCIUM 9.6 9.2 9.3  GFRNONAA 56* 54* 48*  ANIONGAP '8 8 5    '$ Lipids No results for input(s): "CHOL", "TRIG", "HDL", "LABVLDL", "LDLCALC", "CHOLHDL" in the last 168 hours.  Hematology Recent Labs  Lab 01/24/22 2047 01/25/22 0418  WBC 8.6 9.2  RBC 4.18 4.51  HGB 11.4* 12.4  HCT 36.9 39.3  MCV 88.3 87.1  MCH 27.3 27.5  MCHC 30.9 31.6  RDW 13.2 13.1  PLT 258 260   Thyroid  Recent Labs  Lab 01/25/22 0418  TSH 1.050    BNP Recent Labs  Lab 01/24/22 2047  BNP 419.8*    DDimer No results for input(s): "DDIMER" in the last 168 hours.   Radiology      Cardiac Studies   TTE 01/26/22  1. Left ventricular ejection fraction, by estimation, is 60 to 65%. The  left ventricle has normal function. The left ventricle has no regional  wall motion abnormalities. There is mild left ventricular hypertrophy.  Left ventricular diastolic parameters  are indeterminate.   2. Right ventricular systolic function is normal. The right ventricular  size is mildly enlarged. There is mildly elevated pulmonary artery  systolic pressure. The estimated right ventricular systolic pressure is  93.7 mmHg.   3. Right atrial size was moderately dilated.   4. The mitral valve is normal in structure. Mild mitral valve  regurgitation. No evidence of mitral stenosis.   5.  Tricuspid valve regurgitation is severe.   6. The aortic valve is tricuspid. Aortic valve regurgitation is not  visualized. Aortic valve sclerosis is present, with no evidence of aortic  valve stenosis.   7. The inferior vena cava is normal in size with greater than 50%  respiratory variability, suggesting right atrial pressure of 3 mmHg.    TTE 02/11/20 1. Left ventricular ejection fraction, by estimation, is 60 to 65%. The  left ventricle has normal function. The left ventricle has no regional  wall motion abnormalities. There is moderate left ventricular hypertrophy.  Near cavity obliteration in  systole. Left ventricular diastolic parameters are consistent with Grade I  diastolic dysfunction (impaired relaxation).   2. Right ventricular systolic function is normal. The right ventricular  size is normal. There is normal pulmonary artery systolic pressure. The  estimated right ventricular systolic pressure is 90.2 mmHg.   Patient Profile     89 y.o. female with a history of hypertension, GERD, chronic low back pain, paroxysmal atrial fibrillation, allergic rhinitis, overactive bladder, osteoporosis, dementia baseline, and stage III chronic kidney disease also been seen and evaluated for atrial fibrillation RVR.  Assessment & Plan    Atrial fibrillation RVR with a history of paroxysmal atrial fibrillation -Remains in atrial fibrillation with heart rates of 80-110 -Metoprolol tartrate increased to 50 mg twice daily for better rate control -She is continued on apixaban 2.5 mg twice daily for CHA2DS2-VASc of at least 5 -Continued on amiodarone 200 mg twice daily x 7 days and 200 mg daily thereafter -Continued on Cardizem 180 mg daily -Continued on telemetry monitoring for now -TSH was normal at 1.05 -If patient does not convert with oral amiodarone can consider outpatient cardioversion with Dr. Rockey Situ  Acute diastolic congestive heart failure -BNP 419.8 --2.2 L since  admission -Arrived with shortness of breath and peripheral edema -Continued on furosemide 20 mg daily -Likely secondary to atrial fibrillation RVR -She continues to have crackles in both lung bases -She remains on 2 L of O2 via nasal cannula -Serum creatinine 1.08 with serum potassium 3.9 -Daily BMP -Daily weights, I&O, fluid restriction less than 2 L while in atrial fibrillation and low-sodium diet  Acute respiratory failure with hypoxia -Continue to titrate FiO2 to maintain oxygen saturation greater than equal to 92% currently she remains on 2 L -Continue to monitor urine output -Shortness of breath continues to improve  Essential hypertension -Blood pressure 124/84 -Stable this morning but was elevated yesterday  afternoon and evening -Metoprolol tartrate increased to 50 mg twice daily -Continued on furosemide, diltiazem, and metoprolol, lisinopril was discontinued to allow for up titration of metoprolol -Vital signs per unit protocol  Atypical chest pain -Continues with occasional intermittent chest tightness likely related to atrial fibrillation RVR and CHF -High-sensitivity troponin negative x 2 -Echocardiogram revealed normal LV function without wall motion abnormality     For questions or updates, please contact [place] Please consult [email or web address] for contact info under        Signed, Romana Deaton, NP  01/28/2022, 9:54 AM

## 2022-01-29 ENCOUNTER — Ambulatory Visit: Payer: Medicare Other | Admitting: Cardiovascular Disease

## 2022-01-29 DIAGNOSIS — I5031 Acute diastolic (congestive) heart failure: Secondary | ICD-10-CM | POA: Diagnosis not present

## 2022-01-29 LAB — CBC
HCT: 35.1 % — ABNORMAL LOW (ref 36.0–46.0)
Hemoglobin: 11.1 g/dL — ABNORMAL LOW (ref 12.0–15.0)
MCH: 27.5 pg (ref 26.0–34.0)
MCHC: 31.6 g/dL (ref 30.0–36.0)
MCV: 86.9 fL (ref 80.0–100.0)
Platelets: 257 10*3/uL (ref 150–400)
RBC: 4.04 MIL/uL (ref 3.87–5.11)
RDW: 13 % (ref 11.5–15.5)
WBC: 7.4 10*3/uL (ref 4.0–10.5)
nRBC: 0 % (ref 0.0–0.2)

## 2022-01-29 LAB — BASIC METABOLIC PANEL
Anion gap: 10 (ref 5–15)
BUN: 31 mg/dL — ABNORMAL HIGH (ref 8–23)
CO2: 28 mmol/L (ref 22–32)
Calcium: 9.2 mg/dL (ref 8.9–10.3)
Chloride: 98 mmol/L (ref 98–111)
Creatinine, Ser: 1.06 mg/dL — ABNORMAL HIGH (ref 0.44–1.00)
GFR, Estimated: 49 mL/min — ABNORMAL LOW (ref >60)
Glucose, Bld: 105 mg/dL — ABNORMAL HIGH (ref 70–99)
Potassium: 3.8 mmol/L (ref 3.5–5.1)
Sodium: 136 mmol/L (ref 135–145)

## 2022-01-29 MED ORDER — METOPROLOL TARTRATE 50 MG PO TABS: 50.0000 mg | ORAL_TABLET | Freq: Two times a day (BID) | ORAL | 0 refills | Status: DC

## 2022-01-29 MED ORDER — AMIODARONE HCL 200 MG PO TABS: ORAL_TABLET | ORAL | 0 refills | Status: DC

## 2022-01-29 MED ORDER — FUROSEMIDE 20 MG PO TABS: 20.0000 mg | ORAL_TABLET | Freq: Every day | ORAL | 0 refills | Status: DC

## 2022-01-29 NOTE — Telephone Encounter (Signed)
I spoke with the patient's daughter, [name] in regards to her call last week. She had wanted to make sure Dr. Rockey Situ was aware of the patient's admission. Our team did end up seeing the patient and she was discharged today.   Per [place], they did need to make a hospital follow up.  I have scheduled the patient to see Christell Faith, PA on Wednesday 02/07/22 at 2:20 pm.  [name] voices understanding and is agreeable. She was very appreciative of the call back.

## 2022-01-29 NOTE — Discharge Summary (Signed)
Physician Discharge Summary  Leslie Johnston ZHY:[id number] DOB: 02/22/1925 DOA: 01/24/2022  PCP: Olin Hauser, DO  Admit date: 01/24/2022 Discharge date: 01/29/2022  Admitted From: Home Disposition:  Home with home health  Recommendations for Outpatient Follow-up:  Follow up with PCP in 1-2 weeks Follow-up outpatient cardiology  Home Health: Yes PT OT Equipment/Devices: Oxygen 2 L via nasal cannula  Discharge Condition: Stable CODE STATUS: DNR Diet recommendation: Low-sodium  Brief/Interim Summary: 89 y.o. Caucasian female with medical history significant for GERD, hypertension, chronic low back pain, atrial fibrillation and allergic rhinitis, who presented to the emergency room with a CC of worsening dyspnea with associated worsening lower extremity edema.  She has orthopnea without significant worsening and admits to dyspnea on exertion.  No fever or chills.  No worsening cough or wheezing.  No nausea or vomiting or abdominal pain.  No dysuria, oliguria or hematuria or flank pain.  No bleeding diathesis.   Patient volume status is improved but has had difficulty with atrial fibrillation rate controlled.  Cardiology consulted.  Plan for addition of amiodarone 200 mg twice daily in addition to AV nodal blocking agents.  Lasix 20 mg p.o. daily after good IV diuresis.     1/27: Rate control improved but not yet at goal.  Cardiology evaluated.  Made adjustments in medications for better rate control.  1/29: Patient has remained stable.  Rate control improved but not yet at goal.  Appropriate for discharge at this time.  Will discharge on metoprolol 50 mg twice daily, amiodarone 200 mg twice daily times additional 3 days followed by 200 mg daily, Cardizem CD 180 mg daily.  Eliquis for anticoagulation.  Will discharge on low-dose Lasix 20 mg daily.  Mild desaturation noted.  Unable to exclude mild fluid overload but patient is in good care of her family and will be approved for DC  home with outpatient cardiology follow-up.    Discharge Diagnoses:  Principal Problem:   Acute diastolic CHF (congestive heart failure) (HCC) Active Problems:   Acute respiratory failure with hypoxia (HCC)   Atrial fibrillation with RVR (HCC)   GERD without esophagitis   Essential hypertension   Acute on chronic congestive heart failure (HCC)  * Acute on chronic diastolic CHF (congestive heart failure) (HCC) Suspect mild diastolic congestive heart failure.  Patient's respiratory status appears to be improving since admission.  Last echocardiogram showed grade 1 diastolic dysfunction with normal ejection fraction Patient is diuresed effectively Plan: On discharge will recommend 20 mg Lasix p.o. daily.  Recommend low-sodium diet and monitoring of fluid intake.  Outpatient cardiology follow-up.  Mild hypoxia noted.  Will discharge on 2 L nasal cannula.  DME oxygen or ordered.   Acute respiratory failure with hypoxia (HCC) Likely related to fluid overload state.  Continue oxygen via nasal cannula.  Wean as tolerated.  Encourage I-S use.  Ambulated on day of discharge with mild desaturation.  Will order DME oxygen.     Atrial fibrillation with RVR (HCC) Rate control is been difficult to achieve Plan: Continue Cardizem CD 180 mg daily Increase Lopressor to '50mg'$  twice daily for better rate control Continue amiodarone 200 mg twice daily x 3 days followed by 200 mg daily Continue apixaban for CHA2DS2-VASc of at least 5 Follow-up with cardiology to consider outpatient cardioversion  Essential hypertension Antihypertensive regimen as above   GERD without esophagitis PTA PPI  Discharge Instructions  Discharge Instructions     Diet - low sodium heart healthy   Complete by: As  directed    Increase activity slowly   Complete by: As directed       Allergies as of 01/29/2022       Reactions   Amlodipine Swelling   Losartan Itching   Penicillins Hives, Other (See Comments)   Has  patient had a PCN reaction causing immediate rash, facial/tongue/throat swelling, SOB or lightheadedness with hypotension: No Has patient had a PCN reaction causing severe rash involving mucus membranes or skin necrosis: No Has patient had a PCN reaction that required hospitalization: No Has patient had a PCN reaction occurring within the last 10 years: No If all of the above answers are "NO", then may proceed with Cephalosporin use.   Tramadol Other (See Comments)   Confusion        Medication List     STOP taking these medications    lisinopril 20 MG tablet Commonly known as: ZESTRIL   loratadine 10 MG tablet Commonly known as: CLARITIN       TAKE these medications    acetaminophen 650 MG CR tablet Commonly known as: TYLENOL Take 650 mg by mouth every 8 (eight) hours as needed for pain.   albuterol 108 (90 Base) MCG/ACT inhaler Commonly known as: VENTOLIN HFA INHALE 1 TO 2 PUFFS INTO THE LUNGS EVERY 6 HOURS AS NEEDED FOR WHEEZING OR SHORTNESS OF BREATH   amiodarone 200 MG tablet Commonly known as: PACERONE Take 1 tablet (200 mg total) by mouth 2 (two) times daily for 3 days, THEN 1 tablet (200 mg total) daily. Start taking on: January 29, 2022   cetirizine 10 MG tablet Commonly known as: ZYRTEC Take 10 mg by mouth daily.   diltiazem 180 MG 24 hr capsule Commonly known as: CARDIZEM CD TAKE 1 CAPSULE(180 MG) BY MOUTH DAILY   Eliquis 2.5 MG Tabs tablet Generic drug: apixaban TAKE 1 TABLET(2.5 MG) BY MOUTH TWICE DAILY   furosemide 20 MG tablet Commonly known as: LASIX Take 1 tablet (20 mg total) by mouth daily.   metoprolol tartrate 50 MG tablet Commonly known as: LOPRESSOR Take 1 tablet (50 mg total) by mouth 2 (two) times daily. What changed:  medication strength how much to take how to take this when to take this additional instructions   mirtazapine 7.5 MG tablet Commonly known as: REMERON TAKE 1 TABLET(7.5 MG) BY MOUTH AT BEDTIME   multivitamin  with minerals Tabs tablet Take 1 tablet by mouth daily with lunch.   pantoprazole 40 MG tablet Commonly known as: PROTONIX TAKE 1 TABLET BY MOUTH DAILY BEFORE BREAKFAST   PRESERVISION AREDS 2+MULTI VIT PO Take 1 tablet by mouth daily.               Durable Medical Equipment  (From admission, onward)           Start     Ordered   01/29/22 1027  For home use only DME oxygen  Once       Question Answer Comment  Length of Need Lifetime   Mode or (Route) Nasal cannula   Liters per Minute 2   Frequency Continuous (stationary and portable oxygen unit needed)   Oxygen conserving device Yes   Oxygen delivery system Gas      01/29/22 1026            Allergies  Allergen Reactions   Amlodipine Swelling   Losartan Itching   Penicillins Hives and Other (See Comments)    Has patient had a PCN reaction causing immediate rash, facial/tongue/throat swelling,  SOB or lightheadedness with hypotension: No Has patient had a PCN reaction causing severe rash involving mucus membranes or skin necrosis: No Has patient had a PCN reaction that required hospitalization: No Has patient had a PCN reaction occurring within the last 10 years: No If all of the above answers are "NO", then may proceed with Cephalosporin use.    Tramadol Other (See Comments)    Confusion    Consultations: Cardiology-[facility]   Procedures/Studies: ECHOCARDIOGRAM COMPLETE  Result Date: 01/26/2022    ECHOCARDIOGRAM REPORT   Patient Name:   POSIE LILLIBRIDGE Date of Exam: 01/26/2022 Medical Rec #:  [id number]         Height:       65.0 in Accession #:    [id number]        Weight:       143.3 lb Date of Birth:  [birth year]         BSA:          1.717 m Patient Age:    89 years          BP:           117/89 mmHg Patient Gender: F                 HR:           90 bpm. Exam Location:  [facility] Procedure: 2D Echo, Cardiac Doppler and Color Doppler Indications:     Atrial Fibrillation  History:         Patient has prior  history of Echocardiogram examinations. CHF,                  Arrythmias:Atrial Fibrillation; Risk Factors:Hypertension.  Sonographer:     Wenda Low Referring Phys:  [place] Phys: Ida Rogue MD IMPRESSIONS  1. Left ventricular ejection fraction, by estimation, is 60 to 65%. The left ventricle has normal function. The left ventricle has no regional wall motion abnormalities. There is mild left ventricular hypertrophy. Left ventricular diastolic parameters are indeterminate.  2. Right ventricular systolic function is normal. The right ventricular size is mildly enlarged. There is mildly elevated pulmonary artery systolic pressure. The estimated right ventricular systolic pressure is 28.0 mmHg.  3. Right atrial size was moderately dilated.  4. The mitral valve is normal in structure. Mild mitral valve regurgitation. No evidence of mitral stenosis.  5. Tricuspid valve regurgitation is severe.  6. The aortic valve is tricuspid. Aortic valve regurgitation is not visualized. Aortic valve sclerosis is present, with no evidence of aortic valve stenosis.  7. The inferior vena cava is normal in size with greater than 50% respiratory variability, suggesting right atrial pressure of 3 mmHg. FINDINGS  Left Ventricle: Left ventricular ejection fraction, by estimation, is 60 to 65%. The left ventricle has normal function. The left ventricle has no regional wall motion abnormalities. The left ventricular internal cavity size was normal in size. There is  mild left ventricular hypertrophy. Left ventricular diastolic parameters are indeterminate. Right Ventricle: The right ventricular size is mildly enlarged. No increase in right ventricular wall thickness. Right ventricular systolic function is normal. There is mildly elevated pulmonary artery systolic pressure. The tricuspid regurgitant velocity is 2.42 m/s, and with an assumed right atrial pressure of 15 mmHg, the estimated right ventricular  systolic pressure is 03.4 mmHg. Left Atrium: Left atrial size was normal in size. Right Atrium: Right atrial size was moderately dilated. Pericardium: There is no evidence of pericardial effusion. Mitral Valve:  The mitral valve is normal in structure. Mild mitral valve regurgitation. No evidence of mitral valve stenosis. MV peak gradient, 6.2 mmHg. The mean mitral valve gradient is 3.0 mmHg. Tricuspid Valve: The tricuspid valve is normal in structure. Tricuspid valve regurgitation is severe. No evidence of tricuspid stenosis. Aortic Valve: The aortic valve is tricuspid. Aortic valve regurgitation is not visualized. Aortic valve sclerosis is present, with no evidence of aortic valve stenosis. Aortic valve mean gradient measures 3.3 mmHg. Aortic valve peak gradient measures 6.2  mmHg. Aortic valve area, by VTI measures 1.63 cm. Pulmonic Valve: The pulmonic valve was normal in structure. Pulmonic valve regurgitation is not visualized. No evidence of pulmonic stenosis. Aorta: The aortic root is normal in size and structure. Venous: The inferior vena cava is normal in size with greater than 50% respiratory variability, suggesting right atrial pressure of 3 mmHg. IAS/Shunts: No atrial level shunt detected by color flow Doppler.  LEFT VENTRICLE PLAX 2D LVIDd:         2.70 cm LVIDs:         1.70 cm LV PW:         1.00 cm LV IVS:        1.40 cm LVOT diam:     1.80 cm LV SV:         34 LV SV Index:   20 LVOT Area:     2.54 cm  RIGHT VENTRICLE RV Basal diam:  3.60 cm RV Mid diam:    3.40 cm RV S prime:     13.50 cm/s TAPSE (M-mode): 2.5 cm LEFT ATRIUM             Index        RIGHT ATRIUM           Index LA diam:        2.70 cm 1.57 cm/m   RA Area:     26.70 cm LA Vol (A2C):   54.0 ml 31.45 ml/m  RA Volume:   92.20 ml  53.70 ml/m LA Vol (A4C):   43.8 ml 25.51 ml/m LA Biplane Vol: 49.9 ml 29.07 ml/m  AORTIC VALVE                    PULMONIC VALVE AV Area (Vmax):    1.84 cm     PV Vmax:       0.78 m/s AV Area (Vmean):    1.57 cm     PV Peak grad:  2.4 mmHg AV Area (VTI):     1.63 cm AV Vmax:           124.00 cm/s AV Vmean:          86.067 cm/s AV VTI:            0.207 m AV Peak Grad:      6.2 mmHg AV Mean Grad:      3.3 mmHg LVOT Vmax:         89.67 cm/s LVOT Vmean:        53.000 cm/s LVOT VTI:          0.133 m LVOT/AV VTI ratio: 0.64  AORTA Ao Root diam: 3.40 cm Ao Asc diam:  3.30 cm MITRAL VALVE                TRICUSPID VALVE MV Area (PHT): 3.24 cm     TR Peak grad:   23.4 mmHg MV Area VTI:   2.16 cm     TR  Vmax:        242.00 cm/s MV Peak grad:  6.2 mmHg MV Mean grad:  3.0 mmHg     SHUNTS MV Vmax:       1.25 m/s     Systemic VTI:  0.13 m MV Vmean:      70.2 cm/s    Systemic Diam: 1.80 cm MV Decel Time: 234 msec MV E velocity: 118.00 cm/s Ida Rogue MD Electronically signed by Ida Rogue MD Signature Date/Time: 01/26/2022/3:45:26 PM    Final    DG Chest Port 1 View  Result Date: 01/24/2022 CLINICAL DATA:  Dyspnea EXAM: PORTABLE CHEST 1 VIEW COMPARISON:  02/10/2020 FINDINGS: Lung volumes are small and pulmonary insufflation has diminished since prior examination. Small bilateral pleural effusions, left greater than right, have developed. Mild superimposed left basilar atelectasis or infiltrate. Stable chronic interstitial thickening. No pneumothorax. Cardiac size within normal limits. Pulmonary vascularity is normal. No acute bone abnormality. IMPRESSION: 1. Interval development of small bilateral pleural effusions, left greater than right. Superimposed left basilar atelectasis or infiltrate. 2. Pulmonary hypoinflation. Electronically Signed   By: Fidela Salisbury M.D.   On: 01/24/2022 20:18      Subjective: Seen and examined on the day of discharge.  Daughter at bedside.  Patient stable, no distress.  Stable for discharge home.  Discharge Exam: Vitals:   01/29/22 0735 01/29/22 1134  BP: (!) 132/95 112/78  Pulse: (!) 103 72  Resp: 16 (!) 22  Temp: 98.3 F (36.8 C)   SpO2: 90% 94%   Vitals:   01/29/22  0054 01/29/22 0419 01/29/22 0735 01/29/22 1134  BP: (!) 125/100 137/82 (!) 132/95 112/78  Pulse: 98 97 (!) 103 72  Resp: '20 16 16 '$ (!) 22  Temp: 97.8 F (36.6 C) 98.2 F (36.8 C) 98.3 F (36.8 C)   TempSrc:  Oral    SpO2: 92% 95% 90% 94%  Weight:      Height:        General: Pt is alert, awake, not in acute distress Cardiovascular: RRR, S1/S2 +, no rubs, no gallops Respiratory: CTA bilaterally, no wheezing, no rhonchi Abdominal: Soft, NT, ND, bowel sounds + Extremities: no edema, no cyanosis    The results of significant diagnostics from this hospitalization (including imaging, microbiology, ancillary and laboratory) are listed below for reference.     Microbiology: No results found for this or any previous visit (from the past 240 hour(s)).   Labs: BNP (last 3 results) Recent Labs    01/24/22 2047  BNP 500.9*   Basic Metabolic Panel: Recent Labs  Lab 01/24/22 2047 01/25/22 0418 01/27/22 0854 01/28/22 0602 01/29/22 0507  NA 128* 133* 133* 133* 136  K 4.4 4.0 3.9 3.9 3.8  CL 99 101 96* 100 98  CO2 19* '24 29 28 28  '$ GLUCOSE 122* 108* 98 105* 105*  BUN '20 18 18 '$ 27* 31*  CREATININE 0.86 0.95 0.97 1.08* 1.06*  CALCIUM 9.4 9.6 9.2 9.3 9.2   Liver Function Tests: No results for input(s): "AST", "ALT", "ALKPHOS", "BILITOT", "PROT", "ALBUMIN" in the last 168 hours. No results for input(s): "LIPASE", "AMYLASE" in the last 168 hours. No results for input(s): "AMMONIA" in the last 168 hours. CBC: Recent Labs  Lab 01/24/22 2047 01/25/22 0418 01/29/22 0507  WBC 8.6 9.2 7.4  HGB 11.4* 12.4 11.1*  HCT 36.9 39.3 35.1*  MCV 88.3 87.1 86.9  PLT 258 260 257   Cardiac Enzymes: No results for input(s): "CKTOTAL", "CKMB", "CKMBINDEX", "TROPONINI" in the  last 168 hours. BNP: Invalid input(s): "POCBNP" CBG: No results for input(s): "GLUCAP" in the last 168 hours. D-Dimer No results for input(s): "DDIMER" in the last 72 hours. Hgb A1c No results for input(s): "HGBA1C"  in the last 72 hours. Lipid Profile No results for input(s): "CHOL", "HDL", "LDLCALC", "TRIG", "CHOLHDL", "LDLDIRECT" in the last 72 hours. Thyroid function studies No results for input(s): "TSH", "T4TOTAL", "T3FREE", "THYROIDAB" in the last 72 hours.  Invalid input(s): "FREET3" Anemia work up No results for input(s): "VITAMINB12", "FOLATE", "FERRITIN", "TIBC", "IRON", "RETICCTPCT" in the last 72 hours. Urinalysis    Component Value Date/Time   COLORURINE STRAW (A) 08/14/2021 1926   APPEARANCEUR CLEAR (A) 08/14/2021 1926   LABSPEC 1.005 08/14/2021 1926   PHURINE 5.0 08/14/2021 1926   GLUCOSEU NEGATIVE 08/14/2021 1926   HGBUR NEGATIVE 08/14/2021 1926   BILIRUBINUR NEGATIVE 08/14/2021 1926   BILIRUBINUR neg 04/15/2019 [place] 08/14/2021 1926   PROTEINUR NEGATIVE 08/14/2021 1926   UROBILINOGEN 0.2 04/15/2019 1716   NITRITE NEGATIVE 08/14/2021 1926   LEUKOCYTESUR SMALL (A) 08/14/2021 1926   Sepsis Labs Recent Labs  Lab 01/24/22 2047 01/25/22 0418 01/29/22 0507  WBC 8.6 9.2 7.4   Microbiology No results found for this or any previous visit (from the past 240 hour(s)).   Time coordinating discharge: Over 30 minutes  SIGNED:   Sidney Ace, MD  [facility] 01/29/2022, 12:02 PM Pager   If 7PM-7AM, please contact night-coverage

## 2022-01-29 NOTE — Progress Notes (Signed)
DC instructions, med list, low salt diet, CHF teaching reviewed with pt and her two daughters.  Pt's IV and tele monitor removed.  Pt on portable oxygen 2L at discharge.  DME company in contact with the daughter to set up home 02.  Discharged via wheelchair without incident

## 2022-01-29 NOTE — Progress Notes (Signed)
Rounding Note    Patient Name: Leslie Johnston Date of Encounter: 01/29/2022  [place] Cardiologist: Ida Rogue, MD   Subjective   No chest pain, dyspnea, or palpitations. Renal function trending up. Vitals stable with ventricular rates in the low 100s to 110s bpm.   Inpatient Medications    Scheduled Meds:  amiodarone  200 mg Oral BID   [START ON 02/03/2022] amiodarone  200 mg Oral Daily   apixaban  2.5 mg Oral BID   diltiazem  180 mg Oral Daily   famotidine  20 mg Oral BID   furosemide  20 mg Oral BID   loratadine  10 mg Oral Daily   metoprolol tartrate  50 mg Oral BID   mirtazapine  7.5 mg Oral QHS   multivitamin with minerals  1 tablet Oral Q lunch   pantoprazole  40 mg Oral QAC breakfast   Continuous Infusions:  PRN Meds: acetaminophen **OR** acetaminophen, albuterol, magnesium hydroxide, methocarbamol, ondansetron **OR** ondansetron (ZOFRAN) IV, traZODone   Vital Signs    Vitals:   01/28/22 2220 01/29/22 0054 01/29/22 0419 01/29/22 0735  BP: (!) 143/97 (!) 125/100 137/82 (!) 132/95  Pulse: 92 98 97 (!) 103  Resp:  '20 16 16  '$ Temp:  97.8 F (36.6 C) 98.2 F (36.8 C) 98.3 F (36.8 C)  TempSrc:   Oral   SpO2:  92% 95% 90%  Weight:      Height:        Intake/Output Summary (Last 24 hours) at 01/29/2022 0831 Last data filed at 01/28/2022 1500 Gross per 24 hour  Intake 480 ml  Output --  Net 480 ml       01/28/2022    2:43 PM 01/24/2022    8:42 PM 01/24/2022    7:34 PM  Last 3 Weights  Weight (lbs) 129 lb 9.6 oz 143 lb 4.8 oz 125 lb 10.6 oz  Weight (kg) 58.786 kg 65 kg 57 kg      Telemetry    Atrial fibrillation with rates of 80-110s bpm - Personally Reviewed  ECG    No new tracings - Personally Reviewed  Physical Exam   GEN: No acute distress.   Neck: No JVD Cardiac: Irregularly irregular, no murmurs, rubs, or gallops.  Respiratory: Mildly diminished along the bases bilaterally.  Respirations are unlabored at rest on 2 L  of O2 via nasal cannula GI: Soft, nontender, non-distended  MS: No edema; No deformity. Neuro:  Nonfocal  Psych: Normal affect   Labs    High Sensitivity Troponin:   Recent Labs  Lab 01/24/22 2047 01/24/22 2326 01/28/22 1057 01/28/22 1322  TROPONINIHS '9 9 14 15      '$ Chemistry Recent Labs  Lab 01/27/22 0854 01/28/22 0602 01/29/22 0507  NA 133* 133* 136  K 3.9 3.9 3.8  CL 96* 100 98  CO2 '29 28 28  '$ GLUCOSE 98 105* 105*  BUN 18 27* 31*  CREATININE 0.97 1.08* 1.06*  CALCIUM 9.2 9.3 9.2  GFRNONAA 54* 48* 49*  ANIONGAP '8 5 10     '$ Lipids No results for input(s): "CHOL", "TRIG", "HDL", "LABVLDL", "LDLCALC", "CHOLHDL" in the last 168 hours.  Hematology Recent Labs  Lab 01/24/22 2047 01/25/22 0418 01/29/22 0507  WBC 8.6 9.2 7.4  RBC 4.18 4.51 4.04  HGB 11.4* 12.4 11.1*  HCT 36.9 39.3 35.1*  MCV 88.3 87.1 86.9  MCH 27.3 27.5 27.5  MCHC 30.9 31.6 31.6  RDW 13.2 13.1 13.0  PLT 258 260  257    Thyroid  Recent Labs  Lab 01/25/22 0418  TSH 1.050     BNP Recent Labs  Lab 01/24/22 2047  BNP 419.8*     DDimer No results for input(s): "DDIMER" in the last 168 hours.   Radiology      Cardiac Studies   TTE 01/26/22  1. Left ventricular ejection fraction, by estimation, is 60 to 65%. The  left ventricle has normal function. The left ventricle has no regional  wall motion abnormalities. There is mild left ventricular hypertrophy.  Left ventricular diastolic parameters  are indeterminate.   2. Right ventricular systolic function is normal. The right ventricular  size is mildly enlarged. There is mildly elevated pulmonary artery  systolic pressure. The estimated right ventricular systolic pressure is  99.8 mmHg.   3. Right atrial size was moderately dilated.   4. The mitral valve is normal in structure. Mild mitral valve  regurgitation. No evidence of mitral stenosis.   5. Tricuspid valve regurgitation is severe.   6. The aortic valve is tricuspid. Aortic  valve regurgitation is not  visualized. Aortic valve sclerosis is present, with no evidence of aortic  valve stenosis.   7. The inferior vena cava is normal in size with greater than 50%  respiratory variability, suggesting right atrial pressure of 3 mmHg.    TTE 02/11/20 1. Left ventricular ejection fraction, by estimation, is 60 to 65%. The  left ventricle has normal function. The left ventricle has no regional  wall motion abnormalities. There is moderate left ventricular hypertrophy.  Near cavity obliteration in  systole. Left ventricular diastolic parameters are consistent with Grade I  diastolic dysfunction (impaired relaxation).   2. Right ventricular systolic function is normal. The right ventricular  size is normal. There is normal pulmonary artery systolic pressure. The  estimated right ventricular systolic pressure is 33.8 mmHg.   Patient Profile     89 y.o. female with a history of hypertension, GERD, chronic low back pain, paroxysmal atrial fibrillation, allergic rhinitis, overactive bladder, osteoporosis, dementia baseline, and stage III chronic kidney disease also been seen and evaluated for atrial fibrillation RVR.  Assessment & Plan    Atrial fibrillation RVR with a history of paroxysmal atrial fibrillation -Remains in atrial fibrillation with reasonably controlled ventricular rates of 80-110 bpm -If needed down the road, could increase metoprolol tartrate increased to 75 mg twice daily for better rate control with continuation of Cardizem CD 180 mg (will defer for now in an effort to not over medicate) -Continue amiodarone load of 200 mg bid through 2/2 with transition to 200 mg daily on 2/3 -She is continued on apixaban 2.5 mg twice daily (age and weight) for CHA2DS2-VASc of at least 5 -TSH was normal  -If patient does not convert with oral amiodarone can consider outpatient cardioversion with Dr. Rockey Situ  Acute diastolic congestive heart failure -Improved -BNP  419.8 -Net negative 1.7 L since admission -Arrived with shortness of breath and peripheral edema -Continue furosemide 20 mg daily -Likely secondary to atrial fibrillation RVR -She remains on 2 L of O2 via nasal cannula, wean as able -Serum creatinine 1.08 with serum potassium 3.9 -Daily weights, I&O, fluid restriction less than 2 L while in atrial fibrillation and low-sodium diet  Acute respiratory failure with hypoxia -Continue to titrate FiO2 to maintain oxygen saturation greater than equal to 92% currently she remains on 2 L -Continue to monitor urine output -Shortness of breath continues to improve  Essential hypertension -Blood pressure stable -  Meds as above  Atypical chest pain -High-sensitivity troponin negative x 2 -Echocardiogram revealed normal LV function without wall motion abnormality -No plans for inpatient ischemic evaluation at this time   -[place] for discharge from our perspective     For questions or updates, please contact [place] Please consult [email or web address] for contact info under        Signed, Christell Faith, PA-C  01/29/2022, 8:31 AM

## 2022-01-29 NOTE — TOC Transition Note (Signed)
Transition of Care [facility]) - CM/SW Discharge Note   Patient Details  Name: Leslie Johnston MRN: [id number] Date of Birth: [birth year]  Transition of Care [facility]) CM/SW Contact:  Laurena Slimmer, RN Phone Number: 01/29/2022, 11:50 AM   Clinical Narrative:    Spoke with patient's daughter Kingsley Plan at bedside regarding home health. They are agreeable to [facility]. They prefer [facility]. They were advised [facility] would reach directly out to them to schedule an appointment. Dee request to be called at [phone].   Patient will require home oxygen. She had used [facility] in the past. Referral made to [facility] representative.   TOC signing off.           Patient Goals and CMS Choice      Discharge Placement                         Discharge Plan and Services Additional resources added to the After Visit Summary for                                       Social Determinants of Health (SDOH) Interventions SDOH Screenings   Food Insecurity: No Food Insecurity (03/24/2021)  Housing: Low Risk  (03/24/2021)  Transportation Needs: No Transportation Needs (03/24/2021)  Alcohol Screen: Low Risk  (03/24/2021)  Depression (PHQ2-9): Medium Risk (09/13/2021)  Financial Resource Strain: Low Risk  (03/24/2021)  Physical Activity: Insufficiently Active (03/24/2021)  Social Connections: Socially Isolated (03/24/2021)  Stress: No Stress Concern Present (03/24/2021)  Tobacco Use: Low Risk  (01/24/2022)     Readmission Risk Interventions     No data to display

## 2022-01-29 NOTE — Progress Notes (Signed)
SATURATION QUALIFICATIONS: (This note is used to comply with regulatory documentation for home oxygen)  Patient Saturations on Room Air at Rest = 93%  Patient Saturations on Room Air while Ambulating = 83%  Patient Saturations on 2 Liters of oxygen while Ambulating = 93%  Please briefly explain why patient needs home oxygen: Pt DOE on RA with ambulating.  Placed on 2L [place], respiratory symptoms improved

## 2022-01-29 NOTE — Care Management Important Message (Signed)
Important Message  Patient Details  Name: GEARLDENE FIORENZA MRN: [id number] Date of Birth: 02-26-1925   Medicare Important Message Given:  Yes     [name] 01/29/2022, 11:36 AM

## 2022-01-30 ENCOUNTER — Telehealth: Payer: Self-pay | Admitting: *Deleted

## 2022-01-30 NOTE — Patient Outreach (Signed)
  Care Coordination   Follow Up Visit Note   01/30/2022 Name: Leslie Johnston MRN: [id number] DOB: 03-15-1925  Leslie Johnston is a 89 y.o. year old female who sees Olin Hauser, DO for primary care. I spoke with  Leslie Johnston by phone today.  What matters to the patients health and wellness today?  Making sure that she under all the orders from the discharge summary    Goals Addressed             This Visit's Progress    Develop Plan of care for Management of CHF          SDOH assessments and interventions completed:  Yes  SDOH Interventions Today    Flowsheet Row Most Recent Value  SDOH Interventions   Food Insecurity Interventions Intervention Not Indicated  Housing Interventions Intervention Not Indicated  Transportation Interventions Intervention Not Indicated       Interventions Today    Flowsheet Row Most Recent Value  Chronic Disease Discussed/Reviewed   Chronic disease discussed/reviewed during today's visit Congestive Heart Failure (CHF)  General Interventions   General Interventions Discussed/Reviewed Durable Medical Equipment (DME)  Durable Medical Equipment (DME) Oxygen  Nutrition Interventions   Nutrition Discussed/Reviewed Nutrition Discussed  [discussed fluid intake, low sodium diet]  Pharmacy Interventions   Pharmacy Dicussed/Reviewed Pharmacy Topics Reviewed  [Discussed medications that was stopped and those that the dosage was changed]        Care Coordination Interventions:  Yes, provided Rn dicsussed the importance of monitoring weight, sodium and fluid intake. THN Tip this will not be part of the note when signed-REQUIRED REPORT FIELD DO NOT DELETE (Optional):[id number]}  Follow up plan: Referral made to [place] [id number] 2:00    Encounter Outcome:  Pt. Visit Completed   [place] Management [phone]

## 2022-01-30 NOTE — Patient Outreach (Signed)
  Care Coordination [facility] Note Transition Care Management Unsuccessful Follow-up Telephone Call  Date of discharge and from where:  [facility] [phone] Acute on Chronic CHF  Attempts:  1st Attempt  Reason for unsuccessful TCM follow-up call:  Left voice message  [place] Management [phone]

## 2022-01-31 ENCOUNTER — Telehealth: Payer: Self-pay | Admitting: Family Medicine

## 2022-01-31 DIAGNOSIS — M545 Low back pain, unspecified: Secondary | ICD-10-CM | POA: Diagnosis not present

## 2022-01-31 DIAGNOSIS — I4891 Unspecified atrial fibrillation: Secondary | ICD-10-CM | POA: Diagnosis not present

## 2022-01-31 DIAGNOSIS — Z9981 Dependence on supplemental oxygen: Secondary | ICD-10-CM | POA: Diagnosis not present

## 2022-01-31 DIAGNOSIS — R293 Abnormal posture: Secondary | ICD-10-CM | POA: Diagnosis not present

## 2022-01-31 DIAGNOSIS — Z9181 History of falling: Secondary | ICD-10-CM | POA: Diagnosis not present

## 2022-01-31 DIAGNOSIS — I110 Hypertensive heart disease with heart failure: Secondary | ICD-10-CM | POA: Diagnosis not present

## 2022-01-31 DIAGNOSIS — K219 Gastro-esophageal reflux disease without esophagitis: Secondary | ICD-10-CM | POA: Diagnosis not present

## 2022-01-31 DIAGNOSIS — G8929 Other chronic pain: Secondary | ICD-10-CM | POA: Diagnosis not present

## 2022-01-31 DIAGNOSIS — I5033 Acute on chronic diastolic (congestive) heart failure: Secondary | ICD-10-CM | POA: Diagnosis not present

## 2022-01-31 DIAGNOSIS — F0390 Unspecified dementia without behavioral disturbance: Secondary | ICD-10-CM | POA: Diagnosis not present

## 2022-01-31 DIAGNOSIS — J9601 Acute respiratory failure with hypoxia: Secondary | ICD-10-CM | POA: Diagnosis not present

## 2022-01-31 DIAGNOSIS — Z7901 Long term (current) use of anticoagulants: Secondary | ICD-10-CM | POA: Diagnosis not present

## 2022-01-31 NOTE — Telephone Encounter (Unsigned)
Copied from [place] [id number]. Topic: Quick Communication - Home Health Verbal Orders >> Jan 31, 2022  4:00 PM Cyndi Bender wrote: Caller/Agency: Tami Lin with [place] Number: [phone] Requesting OT/PT/Skilled Nursing/Social Work/Speech Therapy: OT Frequency: 1 x 4 weeks

## 2022-01-31 NOTE — Telephone Encounter (Signed)
Home Health Verbal Orders - Caller/Agency: Chris/[facility]  Callback Number: [phone]/ vm can be left  Requesting PT Frequency: 2x's a week for 4 weeks  and  1x a week for 4 weeks   And there is a level 2 medication interaction between amiodarone (PACERONE) 200 MG tablet  and  diltiazem (CARDIZEM CD) 180 MG 24 hr capsule

## 2022-02-01 NOTE — Telephone Encounter (Signed)
Okay to proceed with verbal orders  Nobie Putnam, [facility] 02/01/2022, 12:50 PM

## 2022-02-01 NOTE — Telephone Encounter (Signed)
Colletta [place] is aware.

## 2022-02-02 ENCOUNTER — Encounter: Payer: Medicare Other | Admitting: Family

## 2022-02-02 ENCOUNTER — Telehealth: Payer: Self-pay | Admitting: Cardiovascular Disease

## 2022-02-02 NOTE — Telephone Encounter (Signed)
Pt c/o BP issue: STAT if pt c/o blurred vision, one-sided weakness or slurred speech  1. What are your last 5 BP readings?   Today     BP  112/82  106/54  101/83   2. Are you having any other symptoms (ex. Dizziness, headache, blurred vision, passed out)?   Daughter stated patient can't stay awake  3. What is your BP issue?   Daughter stated he patient is extremely tired.

## 2022-02-03 NOTE — Progress Notes (Unsigned)
Cardiology Office Note    Date:  02/07/2022   ID:  Leslie Johnston, DOB February 14, 1925, MRN [id number]  PCP:  Olin Hauser, DO  Cardiologist:  Ida Rogue, MD  Electrophysiologist:  None   Chief Complaint: Hospital follow up  History of Present Illness:   Leslie Johnston is a 89 y.o. female with history of HFpEF, persistent A-fib, HTN, dementia, osteoporosis, and overactive bladder who presents for hospital follow-up as outlined below.  She was initially diagnosed with A-fib in 02/2020 during hospital admission and converted to sinus rhythm with diltiazem.  She was initiated on apixaban 2.5 mg twice daily.  Echo during that admission showed an EF of 60 to 65%, no regional wall motion abnormalities, moderate LVH, grade 1 diastolic dysfunction, normal RV systolic function and ventricular cavity size, and normal PASP.  She was most recently seen in the office in 12/2021 and was without symptoms of angina or cardiac decompensation.  She was sedentary, sitting for much of the day.  She was maintaining sinus rhythm.  No changes were indicated.  She was admitted to the hospital from 01/24/2022 through 01/29/2022 with acute hypoxic respiratory failure secondary to acute on chronic HFpEF complicated by A-fib with RVR with difficult rate control.  She had symptomatic improvement with IV diuresis during the admission, high-sensitivity troponin was negative x 4.  BNP 419.  Echo showed an EF of 60 to 65%, no regional wall motion abnormalities, mild LVH, normal RV systolic function with mildly enlarged ventricular cavity size, mildly elevated PASP estimated at 38.4 mmHg, moderately dilated right atrium, mild mitral regurgitation, severe tricuspid regurgitation, aortic valve sclerosis without evidence of stenosis, and an estimated right atrial pressure of 3 mmHg.  She comes in today accompanied by 2 of her daughters.  History is supplied mostly by daughters given patient's underlying dementia.   They note her heart rates fluctuate between the 70s to 1 teens bpm.  She remains on supplemental oxygen with oxygen saturations typically in the low to mid 90s.  They feel like she continues to hold onto some extra fluid with lower extremity swelling and orthopnea.  When compared to her last clinic visit in our office in 12/2021, her weight is up 7 pounds.  Upon her discharge weight on home scale was 125 pounds with a current weight of 128 pounds this morning by her home scale.  Appetite remains diminished given low-sodium diet.  No falls or symptoms concerning for bleeding.  Sedentary for the most part, sleeping for extended time frames.   Labs independently reviewed: 01/2022 - Hgb 11.1, PLT 257, potassium 3.8, BUN 31, serum creatinine 1.06, TSH normal 08/2021 - albumin 4.2, AST/ALT normal 02/2020 - TC 142, TG 79, HDL 45, LDL 81  Past Medical History:  Diagnosis Date   A-fib ([place])    Allergic rhinitis    Allergy    Chronic low back pain    GERD (gastroesophageal reflux disease)    Hypertension     Past Surgical History:  Procedure Laterality Date   EYE SURGERY     cataract extraction- Right   KYPHOPLASTY N/A 11/26/2017   Procedure: [id number];  Surgeon: Hessie Knows, MD;  Location: [facility] ORS;  Service: Orthopedics;  Laterality: N/A;    Current Medications: No outpatient medications have been marked as taking for the 02/07/22 encounter (Office Visit) with Rise Mu, PA-C.    Allergies:   Amlodipine, Losartan, Penicillins, and Tramadol   Social History   Socioeconomic History   Marital  status: Widowed    Spouse name: Not on file   Number of children: Not on file   Years of education: Not on file   Highest education level: Not on file  Occupational History   Not on file  Tobacco Use   Smoking status: Never   Smokeless tobacco: Never  Vaping Use   Vaping Use: Never used  Substance and Sexual Activity   Alcohol use: No    Alcohol/week: 0.0 standard drinks of alcohol    Drug use: No   Sexual activity: Not on file  Other Topics Concern   Not on file  Social History Narrative   Not on file   Social Determinants of Health   Financial Resource Strain: Low Risk  (03/24/2021)   Overall Financial Resource Strain (CARDIA)    Difficulty of Paying Living Expenses: Not hard at all  Food Insecurity: No Food Insecurity (01/30/2022)   Hunger Vital Sign    Worried About Running Out of Food in the Last Year: Never true    [place] in the Last Year: Never true  Transportation Needs: No Transportation Needs (01/30/2022)   PRAPARE - Hydrologist (Medical): No    Lack of Transportation (Non-Medical): No  Physical Activity: Insufficiently Active (03/24/2021)   Exercise Vital Sign    Days of Exercise per Week: 2 days    Minutes of Exercise per Session: 20 min  Stress: No Stress Concern Present (03/24/2021)   [place]    Feeling of Stress : Not at all  Social Connections: Socially Isolated (03/24/2021)   Social Connection and Isolation Panel [NHANES]    Frequency of Communication with Friends and Family: Three times a week    Frequency of Social Gatherings with Friends and Family: More than three times a week    Attends Religious Services: Never    Marine scientist or Organizations: No    Attends Archivist Meetings: Never    Marital Status: Widowed     Family History:  The patient's family history includes Arthritis in her maternal grandmother; Asthma in her maternal grandfather; Bladder Cancer in her daughter; Breast cancer in her daughter; Leukemia in her maternal grandfather; Pancreatic cancer in her sister.  ROS:   Unable to be performed secondary to dementia.   EKGs/Labs/Other Studies Reviewed:    Studies reviewed were summarized above. The additional studies were reviewed today:  2D echo 01/26/2022: 1. Left ventricular ejection  fraction, by estimation, is 60 to 65%. The  left ventricle has normal function. The left ventricle has no regional  wall motion abnormalities. There is mild left ventricular hypertrophy.  Left ventricular diastolic parameters  are indeterminate.   2. Right ventricular systolic function is normal. The right ventricular  size is mildly enlarged. There is mildly elevated pulmonary artery  systolic pressure. The estimated right ventricular systolic pressure is  96.7 mmHg.   3. Right atrial size was moderately dilated.   4. The mitral valve is normal in structure. Mild mitral valve  regurgitation. No evidence of mitral stenosis.   5. Tricuspid valve regurgitation is severe.   6. The aortic valve is tricuspid. Aortic valve regurgitation is not  visualized. Aortic valve sclerosis is present, with no evidence of aortic  valve stenosis.   7. The inferior vena cava is normal in size with greater than 50%  respiratory variability, suggesting right atrial pressure of 3 mmHg.  __________  2D echo 02/11/2020: 1. Left ventricular ejection fraction, by estimation, is 60 to 65%. The  left ventricle has normal function. The left ventricle has no regional  wall motion abnormalities. There is moderate left ventricular hypertrophy.  Near cavity obliteration in  systole. Left ventricular diastolic parameters are consistent with Grade I  diastolic dysfunction (impaired relaxation).   2. Right ventricular systolic function is normal. The right ventricular  size is normal. There is normal pulmonary artery systolic pressure. The  estimated right ventricular systolic pressure is 19.3 mmHg.     EKG:  EKG is ordered today.  The EKG ordered today demonstrates A-fib, 78 bpm, low voltage QRS, baseline artifact, poor R wave progression along the precordial leads, nonspecific ST-T changes  Recent Labs: 08/14/2021: ALT 11 01/24/2022: B Natriuretic Peptide 419.8 01/25/2022: TSH 1.050 01/29/2022: BUN 31; Creatinine,  Ser 1.06; Hemoglobin 11.1; Platelets 257; Potassium 3.8; Sodium 136  Recent Lipid Panel    Component Value Date/Time   CHOL 142 02/12/2020 0627   TRIG 79 02/12/2020 0627   HDL 45 02/12/2020 0627   CHOLHDL 3.2 02/12/2020 0627   VLDL 16 02/12/2020 0627   LDLCALC 81 02/12/2020 0627    PHYSICAL EXAM:    VS:  BP 117/75 (BP Location: Right Arm, Patient Position: Sitting, Cuff Size: Normal)   Pulse 78   Ht '5\' 5"'$  (1.651 m)   Wt 133 lb 3.2 oz (60.4 kg)   SpO2 97%   BMI 22.17 kg/m   BMI: Body mass index is 22.17 kg/m.  Physical Exam Vitals reviewed.  Constitutional:      Appearance: She is well-developed.  HENT:     Head: Normocephalic and atraumatic.  Eyes:     General:        Right eye: No discharge.        Left eye: No discharge.  Neck:     Vascular: No JVD.  Cardiovascular:     Rate and Rhythm: Normal rate. Rhythm irregularly irregular.     Heart sounds: S1 normal and S2 normal. Heart sounds not distant. No midsystolic click and no opening snap. Murmur heard.     Systolic murmur is present with a grade of 1/6 at the upper left sternal border.     No friction rub.  Pulmonary:     Effort: Pulmonary effort is normal. No respiratory distress.     Breath sounds: No decreased breath sounds, wheezing or rales.     Comments: Diminished breath sounds bilaterally, particularly along the bases.  Supplemental oxygen noted. Chest:     Chest wall: No tenderness.  Abdominal:     General: There is no distension.     Palpations: Abdomen is soft.     Tenderness: There is no abdominal tenderness.  Musculoskeletal:     Cervical back: Normal range of motion.     Right lower leg: Edema present.     Left lower leg: Edema present.     Comments: Trivial bilateral pretibial edema.  Skin:    General: Skin is warm and dry.     Nails: There is no clubbing.  Neurological:     Mental Status: She is alert and oriented to person, place, and time.  Psychiatric:        Speech: Speech normal.         Behavior: Behavior normal.        Thought Content: Thought content normal.        Judgment: Judgment normal.     Wt Readings from  Last 3 Encounters:  02/07/22 133 lb 3.2 oz (60.4 kg)  01/28/22 129 lb 9.6 oz (58.8 kg)  12/01/21 126 lb (57.2 kg)     ASSESSMENT & PLAN:   Acute on chronic HFpEF: She does continue to appear mildly volume up with a weight that is 7 pounds up by scale today when compared to revisit in 12/2021.  I suspect this is in the context of persistent A-fib.  Titrate furosemide to 20 mg twice daily.  Follow-up BMP in 1 week.  Her family will see if the home health nurse is able to draw BMP at the house.  If so, we will provide an order.  Defer addition of SGLT2 inhibitor or MRA in the setting of advanced age and frail state, and an effort to minimize venipuncture.  Persistent A-fib: She remains in A-fib with controlled ventricular response.  Primary cardiologist has previously mentioned potential cardioversion in the outpatient setting.  I am not certain she would tolerate this given her advanced age, frail state, and underlying dementia.  I will defer this decision to him.  For now, she remains on amiodarone 200 mg daily along with diltiazem 180 mg daily and Lopressor 50 mg twice daily.  If rate control strategy is pursued, would recommend discontinuing amiodarone.  CHA2DS2-VASc at least 5.  She remains on apixaban 2.5 mg twice daily given age and baseline weight less than 60 kg.  No falls or symptoms concerning for bleeding.  Chronic hypoxic respiratory failure: Stable.  Follow-up with PCP.  HTN: Blood pressure well-controlled in the office today.  Continue medical therapy as outlined above.  Dementia: Stable.   Disposition: F/u with Dr. Rockey Situ in 1 month.   Medication Adjustments/Labs and Tests Ordered: Current medicines are reviewed at length with the patient today.  Concerns regarding medicines are outlined above. Medication changes, Labs and Tests ordered today  are summarized above and listed in the Patient Instructions accessible in Encounters.   Signed, Christell Faith, PA-C 02/07/2022 4:57 PM     [facility] [address] [place], [place] [address] [phone]

## 2022-02-05 DIAGNOSIS — I110 Hypertensive heart disease with heart failure: Secondary | ICD-10-CM | POA: Diagnosis not present

## 2022-02-05 DIAGNOSIS — G8929 Other chronic pain: Secondary | ICD-10-CM | POA: Diagnosis not present

## 2022-02-05 DIAGNOSIS — K219 Gastro-esophageal reflux disease without esophagitis: Secondary | ICD-10-CM | POA: Diagnosis not present

## 2022-02-05 DIAGNOSIS — I5033 Acute on chronic diastolic (congestive) heart failure: Secondary | ICD-10-CM | POA: Diagnosis not present

## 2022-02-05 DIAGNOSIS — I4891 Unspecified atrial fibrillation: Secondary | ICD-10-CM | POA: Diagnosis not present

## 2022-02-05 DIAGNOSIS — J9601 Acute respiratory failure with hypoxia: Secondary | ICD-10-CM | POA: Diagnosis not present

## 2022-02-05 NOTE — Telephone Encounter (Signed)
Spoke to patient's EC and she stated that the patient was "fine" through the weekend. She stated that the patient has dementia however she was able to respond appropriately to questions when asked. EC was worried about fluctuations in BP and HR and patient wanting to sleep most of the day. EC stated that she has PT today (02/05/22) and an appointment with cardiologist on Wednesday (02/07/22). EC just wanted cardiologist to be aware but thinks she is doing well enough to wait until appointment.

## 2022-02-07 ENCOUNTER — Ambulatory Visit: Payer: Medicare Other | Attending: Physician Assistant | Admitting: Physician Assistant

## 2022-02-07 ENCOUNTER — Encounter: Payer: Self-pay | Admitting: Physician Assistant

## 2022-02-07 ENCOUNTER — Telehealth: Payer: Self-pay

## 2022-02-07 VITALS — BP 117/75 | HR 78 | Ht 65.0 in | Wt 133.2 lb

## 2022-02-07 DIAGNOSIS — I10 Essential (primary) hypertension: Secondary | ICD-10-CM | POA: Diagnosis not present

## 2022-02-07 DIAGNOSIS — N183 Chronic kidney disease, stage 3 unspecified: Secondary | ICD-10-CM

## 2022-02-07 DIAGNOSIS — I4819 Other persistent atrial fibrillation: Secondary | ICD-10-CM | POA: Diagnosis not present

## 2022-02-07 DIAGNOSIS — F0390 Unspecified dementia without behavioral disturbance: Secondary | ICD-10-CM | POA: Diagnosis not present

## 2022-02-07 DIAGNOSIS — I5033 Acute on chronic diastolic (congestive) heart failure: Secondary | ICD-10-CM

## 2022-02-07 MED ORDER — FUROSEMIDE 20 MG PO TABS: 20.0000 mg | ORAL_TABLET | Freq: Two times a day (BID) | ORAL | 3 refills | Status: DC

## 2022-02-07 NOTE — Telephone Encounter (Signed)
Tiffany, with [facility] calling to see if pt had dx of Tachycardia and HOH, advised her of cardiac hx diagnoses but didn't see any dx of HOH. She also asked about orders for PT/OT, advised her that previous request from [facility] on 01/31/22 was requested via verbal orders and Dr. Raliegh Ip did give ok for verbal orders on 02/01/22. No further assistance was needed.

## 2022-02-07 NOTE — Patient Instructions (Signed)
Medication Instructions:  Your physician has recommended you make the following change in your medication:   INCREASE Furosemide to 20 mg twice a day  *If you need a refill on your cardiac medications before your next appointment, please call your pharmacy*   Lab Work: BMET in one week. No appointment is needed. Just go to the following location:   [facility] Entrance at [facility] 1st desk on the right to check in (REGISTRATION)  Lab hours: Monday- Friday (7:30 am- 5:30 pm)  If you have labs (blood work) drawn today and your tests are completely normal, you will receive your results only by: MyChart Message (if you have MyChart) OR A paper copy in the mail If you have any lab test that is abnormal or we need to change your treatment, we will call you to review the results.   Testing/Procedures: None   Follow-Up: At [facility], you and your health needs are our priority.  As part of our continuing mission to provide you with exceptional heart care, we have created designated Provider Care Teams.  These Care Teams include your primary Cardiologist (physician) and Advanced Practice Providers (APPs -  Physician Assistants and Nurse Practitioners) who all work together to provide you with the care you need, when you need it.   Your next appointment:   1 month(s)  Provider:   Ida Rogue, MD Only

## 2022-02-08 ENCOUNTER — Ambulatory Visit: Payer: Self-pay | Admitting: *Deleted

## 2022-02-08 DIAGNOSIS — I4891 Unspecified atrial fibrillation: Secondary | ICD-10-CM | POA: Diagnosis not present

## 2022-02-08 DIAGNOSIS — J9601 Acute respiratory failure with hypoxia: Secondary | ICD-10-CM | POA: Diagnosis not present

## 2022-02-08 DIAGNOSIS — K219 Gastro-esophageal reflux disease without esophagitis: Secondary | ICD-10-CM | POA: Diagnosis not present

## 2022-02-08 DIAGNOSIS — G8929 Other chronic pain: Secondary | ICD-10-CM | POA: Diagnosis not present

## 2022-02-08 DIAGNOSIS — I5033 Acute on chronic diastolic (congestive) heart failure: Secondary | ICD-10-CM | POA: Diagnosis not present

## 2022-02-08 DIAGNOSIS — I110 Hypertensive heart disease with heart failure: Secondary | ICD-10-CM | POA: Diagnosis not present

## 2022-02-08 NOTE — Patient Outreach (Signed)
  Care Coordination   Initial Visit Note   02/09/2022 Name: Leslie Johnston MRN: [id number] DOB: 05-18-1925  Leslie Johnston is a 89 y.o. year old female who sees Leslie Hauser, DO for primary care. I spoke with  Leslie Johnston, daughter of Leslie Johnston by phone today.  What matters to the patients health and wellness today?  Per daughter, she is concerned about patient's health decline.  Hospitalized 1/24-1/29 for CHF, has not fully recovered since.  Was seen in cardiology office on yesterday, state patient's oxygen saturations decreased to 68% during mobility and travel, home oxygen was increased to 3 liters.  She is supposed to go back next week for labs, daughter wondering if she can have this done in the home.     Goals Addressed             This Visit's Progress    Develop Plan of care for Management of CHF       Care Coordination Interventions: Basic overview and discussion of pathophysiology of Heart Failure reviewed Provided education on low sodium diet Reviewed Heart Failure Action Plan in depth and provided written copy Assessed need for readable accurate scales in home Advised patient to weigh each morning after emptying bladder Discussed importance of daily weight and advised patient to weigh and record daily Reviewed role of diuretics in prevention of fluid overload and management of heart failure; Discussed the importance of keeping all appointments with provider Screening for signs and symptoms of depression related to chronic disease state  Assessed social determinant of health barriers  Request made to PCP for home health RN for home labs as well as for possible palliative care referral. Patient was active in the past with palliative, case ended on 11/21/21.  Confirmed patient has [facility] already active for PT/OT sessions.  Discussed medication management, especially Lasix, '20mg'$  twice a day for fluid management. Discussed breathing techniques/exercises as  daughter report patient's oxygen levels decrease with slight activity.  Decreases down to 60s at times, increases with deep breathing and use of incentive spirometry          SDOH assessments and interventions completed:  Yes     Care Coordination Interventions:  Yes, provided   Follow up plan: Follow up call scheduled for 2/14    Encounter Outcome:  Pt. Visit Completed   Leslie David, RN, MSN, [facility] Care Management Care Management Coordinator [phone]

## 2022-02-08 NOTE — Addendum Note (Signed)
Addended by: James Ivanoff D on: 02/08/2022 09:31 AM   Modules accepted: Orders

## 2022-02-09 ENCOUNTER — Other Ambulatory Visit: Payer: Self-pay | Admitting: Family Medicine

## 2022-02-09 ENCOUNTER — Encounter: Payer: Self-pay | Admitting: *Deleted

## 2022-02-09 DIAGNOSIS — I110 Hypertensive heart disease with heart failure: Secondary | ICD-10-CM | POA: Diagnosis not present

## 2022-02-09 DIAGNOSIS — J9601 Acute respiratory failure with hypoxia: Secondary | ICD-10-CM | POA: Diagnosis not present

## 2022-02-09 DIAGNOSIS — G8929 Other chronic pain: Secondary | ICD-10-CM | POA: Diagnosis not present

## 2022-02-09 DIAGNOSIS — K219 Gastro-esophageal reflux disease without esophagitis: Secondary | ICD-10-CM | POA: Diagnosis not present

## 2022-02-09 DIAGNOSIS — I4891 Unspecified atrial fibrillation: Secondary | ICD-10-CM | POA: Diagnosis not present

## 2022-02-09 DIAGNOSIS — I5033 Acute on chronic diastolic (congestive) heart failure: Secondary | ICD-10-CM | POA: Diagnosis not present

## 2022-02-09 NOTE — Patient Instructions (Signed)
Visit Information  Thank you for taking time to visit with me today. Please don't hesitate to contact me if I can be of assistance to you before our next scheduled telephone appointment.  Following are the goals we discussed today:  Listen for call from [place] and [place].  Our next appointment is by telephone on 2/14  Please call the care guide team at [phone] if you need to cancel or reschedule your appointment.   Please call the Suicide and Crisis Lifeline: 988 call the [place] National Suicide Prevention Lifeline: 928-821-7746 or TTY: 306-416-4447 TTY (636)246-4885) to talk to a trained counselor call 1-800-273-TALK (toll free, 24 hour hotline) call 911 if you are experiencing a Mental Health or [place] or need someone to talk to.  Patient verbalizes understanding of instructions and care plan provided today and agrees to view in [place]. Active MyChart status and patient understanding of how to access instructions and care plan via MyChart confirmed with patient.     The patient has been provided with contact information for the care management team and has been advised to call with any health related questions or concerns.   Valente David, RN, MSN, [place] Care Management Care Management Coordinator [phone]

## 2022-02-09 NOTE — Telephone Encounter (Signed)
Requested Prescriptions  Refused Prescriptions Disp Refills   pantoprazole (PROTONIX) 40 MG tablet [Pharmacy Med Name: PANTOPRAZOLE 40MG TABLETS] 90 tablet 3    Sig: TAKE 1 TABLET BY MOUTH DAILY BEFORE BREAKFAST     Gastroenterology: Proton Pump Inhibitors Passed - 02/09/2022  8:09 AM      Passed - Valid encounter within last 12 months    Recent Outpatient Visits           4 months ago Recurrent lower abdominal pain   [facility] Olin Hauser, DO   1 year ago Postmenopausal osteoporosis with pathological fracture of humerus   [place], DO   1 year ago Compression fracture of thoracic vertebra, unspecified thoracic vertebral level, sequela   [place], DO   1 year ago Chronic bilateral low back pain without sciatica   [facility] Olin Hauser, DO   2 years ago Benign hypertension with CKD (chronic kidney disease) stage III   [place] [facility] Olin Hauser, DO       Future Appointments             In 2 months Gollan, Kathlene November, MD [place] [facility]

## 2022-02-11 ENCOUNTER — Telehealth: Payer: Self-pay | Admitting: Cardiology

## 2022-02-11 NOTE — Telephone Encounter (Signed)
Patients daughter, Oletta Cohn, called the answering service this morning concerned that patient's oxygen drops when she walks. Patient does have chronic hypoxic respiratory failure and is on 2L supplemental oxygen chronically. Daughter notices that when patient is sitting/resting, her oxygen remains in the 90s. Patient is able to walk to the bathroom, and occasionally reports feeling like she cannot breathe. When she sits back down, her oxygen is usually in the low 60s. After resting for a few moments and taking some deep breaths, her oxygen improves quickly to the 90s. Denies dizziness, syncope, near syncope. Patient was seen by Christell Faith PA-C on 2/7, and was told to increase her lasix at that appointment. Daughter has noticed that patient has been going to the bathroom more frequently, but breathing is unchanged. Daughter reports that her breathing has been like this for several months, and her oxygen has been dipping with ambulation for months. Daughter asked if she should increase patient's supplemental oxygen while ambulating. I agreed that this would be appropriate, as some people do require a higher level of supplemental oxygen while ambulating. I also encouraged daughter to make sure that patient was breathing through her nose while wearing the [place], and taking deep breaths when needed to increase oxygen level.   I offered an appointment in our office this week, but daughter reports that they have reached out to hospice and would prefer to avoid taking the patient to and from doctors appointments if possible. Daughter reports that they contracted hospice this week, and are trying to arrange a home health RN to visit to help draw labs. I agreed that hospice is a great resource, and that their staff is very helpful in arranging home resources for what the patient may need moving forward. I encouraged daughter to reach out to hospice on Monday for further assistance.   Margie Billet, PA-C 02/11/2022  11:38 AM

## 2022-02-12 ENCOUNTER — Telehealth (INDEPENDENT_AMBULATORY_CARE_PROVIDER_SITE_OTHER): Payer: Medicare Other | Admitting: Family Medicine

## 2022-02-12 ENCOUNTER — Encounter: Payer: Self-pay | Admitting: Family Medicine

## 2022-02-12 DIAGNOSIS — R531 Weakness: Secondary | ICD-10-CM

## 2022-02-12 DIAGNOSIS — G8929 Other chronic pain: Secondary | ICD-10-CM | POA: Diagnosis not present

## 2022-02-12 DIAGNOSIS — I5031 Acute diastolic (congestive) heart failure: Secondary | ICD-10-CM | POA: Diagnosis not present

## 2022-02-12 DIAGNOSIS — I5033 Acute on chronic diastolic (congestive) heart failure: Secondary | ICD-10-CM | POA: Diagnosis not present

## 2022-02-12 DIAGNOSIS — K219 Gastro-esophageal reflux disease without esophagitis: Secondary | ICD-10-CM | POA: Diagnosis not present

## 2022-02-12 DIAGNOSIS — J9601 Acute respiratory failure with hypoxia: Secondary | ICD-10-CM

## 2022-02-12 DIAGNOSIS — I4891 Unspecified atrial fibrillation: Secondary | ICD-10-CM

## 2022-02-12 DIAGNOSIS — R5381 Other malaise: Secondary | ICD-10-CM | POA: Diagnosis not present

## 2022-02-12 DIAGNOSIS — F02818 Dementia in other diseases classified elsewhere, unspecified severity, with other behavioral disturbance: Secondary | ICD-10-CM

## 2022-02-12 DIAGNOSIS — I110 Hypertensive heart disease with heart failure: Secondary | ICD-10-CM | POA: Diagnosis not present

## 2022-02-12 NOTE — Patient Instructions (Addendum)
   Please schedule a Follow-up Appointment to: Return if symptoms worsen or fail to improve.  If you have any other questions or concerns, please feel free to call the office or send a message through MyChart. You may also schedule an earlier appointment if necessary.  Additionally, you may be receiving a survey about your experience at our office within a few days to 1 week by e-mail or mail. We value your feedback.  Indyah Saulnier, DO [facility] 

## 2022-02-12 NOTE — Progress Notes (Signed)
Subjective:    Patient ID: Leslie Johnston, female    DOB: 28-Sep-1925, 89 y.o.   MRN: [id number]  Leslie Johnston is a 89 y.o. female presenting on 02/12/2022 for Congestive Heart Failure  Virtual / Telehealth Encounter - Video Visit via MyChart The purpose of this virtual visit is to provide medical care while limiting exposure to the novel coronavirus (COVID19) for both patient and office staff.  Consent was obtained for remote visit:  Yes.   Answered questions that patient had about telehealth interaction:  Yes.   I discussed the limitations, risks, security and privacy concerns of performing an evaluation and management service by video/telephone. I also discussed with the patient that there may be a patient responsible charge related to this service. The patient expressed understanding and agreed to proceed.  Patient Location: Home Provider Location: [facility] (Office)  Participants in virtual visit: - Patient: Leslie Johnston, Daughter = [name] - CMA: Orinda Kenner, CMA - Provider: Dr Parks Ranger   HPI  Acute on Chronic Heart Failure with preserved EF (Diastolic CHF) Atrial Fibrillation Hypoxic Respiratory Failure, due to CHF Generalized Weakness / Deconditioning  Followed by Cardiology [facility]  Recent update w/ hospitalization from [facility] 01/24/22 admit and discharged on 01/29/22, initially discharged with Home Health but has had difficulty in arranging this service. She was discharged on home oxygen 2L, she was managed on medications to control her heart rate and was on anticoagulation. She had some fluid overload that was treated.  Currently she has had continued clinical decline with deconditioning and generalized weakness, with difficulty with her mobility.   She has had desaturation with activity and walking, desat O2 < 88%. Improved with rest and on 2L O2. She has been increasing Lasix as directed by Cardiology since 2/7 visit, increased bathromo  trips, but some difficulty still with ambulation and mobility. Daughter and family serving as caregiver for her. She has been too weak to go to doctors apt or lab draw.  Home hospice evaluation was already requested. They are interested to pursue with this.  Last labs were done in January 2024. Cardiology requested an additional chemistry soon to follow up kidney function response to treatment but they are unable to go get the lab done.     09/13/2021    4:40 PM 03/24/2021    3:27 PM 09/12/2020    2:27 PM  Depression screen PHQ 2/9  Decreased Interest 0 0 1  Down, Depressed, Hopeless 1 1 1  $ PHQ - 2 Score 1 1 2  $ Altered sleeping 0  0  Tired, decreased energy 1  1  Change in appetite 1  1  Feeling bad or failure about yourself  0  0  Trouble concentrating 3  3  Moving slowly or fidgety/restless 0  0  Suicidal thoughts 0  0  PHQ-9 Score 6  7  Difficult doing work/chores Not difficult at all  Extremely dIfficult    Social History   Tobacco Use   Smoking status: Never   Smokeless tobacco: Never  Vaping Use   Vaping Use: Never used  Substance Use Topics   Alcohol use: No    Alcohol/week: 0.0 standard drinks of alcohol   Drug use: No    Review of Systems Per HPI unless specifically indicated above     Objective:    There were no vitals taken for this visit.  Wt Readings from Last 3 Encounters:  02/07/22 133 lb 3.2 oz (60.4 kg)  01/28/22 129 lb 9.6 oz (58.8 kg)  12/01/21 126 lb (57.2 kg)    Physical Exam  Note examination was completely remotely via video observation objective data only  Gen - chronically ill-appearing age 89 yr female, no acute distress or apparent pain, comfortable HEENT - eyes appear clear without discharge or redness Heart/Lungs - cannot examine virtually, has oxygen Abd - cannot examine virtually  Skin - face visible today- no rash Neuro - awake, alert, oriented Psych - not anxious appearing    Results for orders placed or performed during  the hospital encounter of [id number]  Basic metabolic panel  Result Value Ref Range   Sodium 128 (L) 135 - 145 mmol/L   Potassium 4.4 3.5 - 5.1 mmol/L   Chloride 99 98 - 111 mmol/L   CO2 19 (L) 22 - 32 mmol/L   Glucose, Bld 122 (H) 70 - 99 mg/dL   BUN 20 8 - 23 mg/dL   Creatinine, Ser 0.86 0.44 - 1.00 mg/dL   Calcium 9.4 8.9 - 10.3 mg/dL   GFR, Estimated >60 >60 mL/min   Anion gap 10 5 - 15  CBC  Result Value Ref Range   WBC 8.6 4.0 - 10.5 K/uL   RBC 4.18 3.87 - 5.11 MIL/uL   Hemoglobin 11.4 (L) 12.0 - 15.0 g/dL   HCT 36.9 36.0 - 46.0 %   MCV 88.3 80.0 - 100.0 fL   MCH 27.3 26.0 - 34.0 pg   MCHC 30.9 30.0 - 36.0 g/dL   RDW 13.2 11.5 - 15.5 %   Platelets 258 150 - 400 K/uL   nRBC 0.0 0.0 - 0.2 %  Brain natriuretic peptide  Result Value Ref Range   B Natriuretic Peptide 419.8 (H) 0.0 - 100.0 pg/mL  Basic metabolic panel  Result Value Ref Range   Sodium 133 (L) 135 - 145 mmol/L   Potassium 4.0 3.5 - 5.1 mmol/L   Chloride 101 98 - 111 mmol/L   CO2 24 22 - 32 mmol/L   Glucose, Bld 108 (H) 70 - 99 mg/dL   BUN 18 8 - 23 mg/dL   Creatinine, Ser 0.95 0.44 - 1.00 mg/dL   Calcium 9.6 8.9 - 10.3 mg/dL   GFR, Estimated 56 (L) >60 mL/min   Anion gap 8 5 - 15  CBC  Result Value Ref Range   WBC 9.2 4.0 - 10.5 K/uL   RBC 4.51 3.87 - 5.11 MIL/uL   Hemoglobin 12.4 12.0 - 15.0 g/dL   HCT 39.3 36.0 - 46.0 %   MCV 87.1 80.0 - 100.0 fL   MCH 27.5 26.0 - 34.0 pg   MCHC 31.6 30.0 - 36.0 g/dL   RDW 13.1 11.5 - 15.5 %   Platelets 260 150 - 400 K/uL   nRBC 0.0 0.0 - 0.2 %  TSH  Result Value Ref Range   TSH 1.050 0.350 - 4.500 uIU/mL  Basic metabolic panel  Result Value Ref Range   Sodium 133 (L) 135 - 145 mmol/L   Potassium 3.9 3.5 - 5.1 mmol/L   Chloride 96 (L) 98 - 111 mmol/L   CO2 29 22 - 32 mmol/L   Glucose, Bld 98 70 - 99 mg/dL   BUN 18 8 - 23 mg/dL   Creatinine, Ser 0.97 0.44 - 1.00 mg/dL   Calcium 9.2 8.9 - 10.3 mg/dL   GFR, Estimated 54 (L) >60 mL/min   Anion gap 8 5 - 15   Basic metabolic panel  Result Value Ref Range  Sodium 133 (L) 135 - 145 mmol/L   Potassium 3.9 3.5 - 5.1 mmol/L   Chloride 100 98 - 111 mmol/L   CO2 28 22 - 32 mmol/L   Glucose, Bld 105 (H) 70 - 99 mg/dL   BUN 27 (H) 8 - 23 mg/dL   Creatinine, Ser 1.08 (H) 0.44 - 1.00 mg/dL   Calcium 9.3 8.9 - 10.3 mg/dL   GFR, Estimated 48 (L) >60 mL/min   Anion gap 5 5 - 15  Basic metabolic panel  Result Value Ref Range   Sodium 136 135 - 145 mmol/L   Potassium 3.8 3.5 - 5.1 mmol/L   Chloride 98 98 - 111 mmol/L   CO2 28 22 - 32 mmol/L   Glucose, Bld 105 (H) 70 - 99 mg/dL   BUN 31 (H) 8 - 23 mg/dL   Creatinine, Ser 1.06 (H) 0.44 - 1.00 mg/dL   Calcium 9.2 8.9 - 10.3 mg/dL   GFR, Estimated 49 (L) >60 mL/min   Anion gap 10 5 - 15  CBC  Result Value Ref Range   WBC 7.4 4.0 - 10.5 K/uL   RBC 4.04 3.87 - 5.11 MIL/uL   Hemoglobin 11.1 (L) 12.0 - 15.0 g/dL   HCT 35.1 (L) 36.0 - 46.0 %   MCV 86.9 80.0 - 100.0 fL   MCH 27.5 26.0 - 34.0 pg   MCHC 31.6 30.0 - 36.0 g/dL   RDW 13.0 11.5 - 15.5 %   Platelets 257 150 - 400 K/uL   nRBC 0.0 0.0 - 0.2 %  ECHOCARDIOGRAM COMPLETE  Result Value Ref Range   Weight 2,292.78 oz   Height 65 in   BP 117/89 mmHg   Ao pk vel 1.24 m/s   AV Area VTI 1.63 cm2   AR max vel 1.84 cm2   AV Mean grad 3.3 mmHg   AV Peak grad 6.2 mmHg   S' Lateral 1.70 cm   AV Area mean vel 1.57 cm2   Area-P 1/2 3.24 cm2   MV VTI 2.16 cm2   Est EF 60 - 65%   Troponin I (High Sensitivity)  Result Value Ref Range   Troponin I (High Sensitivity) 9 <18 ng/L  Troponin I (High Sensitivity)  Result Value Ref Range   Troponin I (High Sensitivity) 9 <18 ng/L  Troponin I (High Sensitivity)  Result Value Ref Range   Troponin I (High Sensitivity) 14 <18 ng/L  Troponin I (High Sensitivity)  Result Value Ref Range   Troponin I (High Sensitivity) 15 <18 ng/L      Assessment & Plan:   Problem List Items Addressed This Visit     Acute diastolic CHF (congestive heart failure) (HCC)  - Primary   Acute respiratory failure with hypoxia (HCC)   Atrial fibrillation with RVR (HCC)   Other Visit Diagnoses     Dementia associated with other underlying disease with behavioral disturbance (HCC)       Generalized weakness       Physical deconditioning           Virtual visit today for HFU with some clinical decline  Acute on Chronic Diastolic CHF / secondary respiratory failure w/ hypoxia on O2 Atrial Fibrillation w history RVR  Following hospitalization Generalized weakness and debilitated difficulty with mobility and unable to leave home safely to go to doctors apt or get labs done  Recently seen by Cardiology 2/7  On diuretic and medication management for rate control and anticoagulation.  Since discharge was initially  ordered Home Health but now discussions with family and Cardiology about Hospice care, given her decline.  I have contacted her Cardiologist as well about this .  Discussion today about pursuing hospice referral, I have already spoken to [facility] intake and they already have orders, requesting that I serve as attending physician if she is accepted into hospice and they can do home evaluation tomorrow 02/13/22.  Family agrees with this plan to pursue hospice now.  No orders of the defined types were placed in this encounter.     Follow up plan: Return if symptoms worsen or fail to improve.  Patient verbalizes understanding with the above medical recommendations including the limitation of remote medical advice.  Specific follow-up and call-back criteria were given for patient to follow-up or seek medical care more urgently if needed.  Total duration of direct patient care provided via video conference: 10 minutes   Nobie Putnam, [facility] 02/12/2022, 4:39 PM

## 2022-02-13 ENCOUNTER — Telehealth: Payer: Self-pay

## 2022-02-13 ENCOUNTER — Other Ambulatory Visit: Payer: Self-pay

## 2022-02-13 ENCOUNTER — Encounter: Payer: Self-pay | Admitting: Cardiovascular Disease

## 2022-02-13 ENCOUNTER — Telehealth: Payer: Self-pay | Admitting: *Deleted

## 2022-02-13 ENCOUNTER — Encounter: Payer: Medicare Other | Admitting: Family

## 2022-02-13 DIAGNOSIS — I4819 Other persistent atrial fibrillation: Secondary | ICD-10-CM

## 2022-02-13 DIAGNOSIS — N183 Chronic kidney disease, stage 3 unspecified: Secondary | ICD-10-CM

## 2022-02-13 DIAGNOSIS — J9610 Chronic respiratory failure, unspecified whether with hypoxia or hypercapnia: Secondary | ICD-10-CM | POA: Diagnosis not present

## 2022-02-13 DIAGNOSIS — F0390 Unspecified dementia without behavioral disturbance: Secondary | ICD-10-CM | POA: Diagnosis not present

## 2022-02-13 DIAGNOSIS — K219 Gastro-esophageal reflux disease without esophagitis: Secondary | ICD-10-CM | POA: Diagnosis not present

## 2022-02-13 DIAGNOSIS — J302 Other seasonal allergic rhinitis: Secondary | ICD-10-CM | POA: Diagnosis not present

## 2022-02-13 DIAGNOSIS — M810 Age-related osteoporosis without current pathological fracture: Secondary | ICD-10-CM | POA: Diagnosis not present

## 2022-02-13 DIAGNOSIS — I4891 Unspecified atrial fibrillation: Secondary | ICD-10-CM | POA: Diagnosis not present

## 2022-02-13 DIAGNOSIS — I130 Hypertensive heart and chronic kidney disease with heart failure and stage 1 through stage 4 chronic kidney disease, or unspecified chronic kidney disease: Secondary | ICD-10-CM | POA: Diagnosis not present

## 2022-02-13 DIAGNOSIS — I509 Heart failure, unspecified: Secondary | ICD-10-CM | POA: Diagnosis not present

## 2022-02-13 DIAGNOSIS — N1832 Chronic kidney disease, stage 3b: Secondary | ICD-10-CM | POA: Diagnosis not present

## 2022-02-13 NOTE — Telephone Encounter (Signed)
Copied from [place]. Topic: General - Other >> Feb 13, 2022 11:53 AM Leone Payor F wrote: Reason for CRM: Marita Kansas with [facility] is calling in because they received a hospice referral and a hospice nurse is going out to see the patient. Patient's daughter has requested Dr. Parks Ranger to service hospice attending. Marita Kansas says patient has a life expectancy of less than 6 months if he agrees.

## 2022-02-13 NOTE — Telephone Encounter (Signed)
Copied from [place]. Topic: General - Other >> Feb 13, 2022  4:20 PM Ludger Nutting wrote: Mitzi with [facility] called to discuss patient care with pcp. Patient was just admitted into their care and some adjustments to her medication may need to be made. Mitzi stated that patients bp while resting was 100/80 and all ten of her fingernail beds are white. Please follow up with Mitzi.

## 2022-02-13 NOTE — Patient Outreach (Signed)
  Care Coordination   Follow Up Visit Note   02/13/2022 Name: Leslie Johnston MRN: [id number] DOB: 08/23/1925  Leslie Johnston is a 89 y.o. year old female who sees Olin Hauser, DO for primary care.   What matters to the patients health and wellness today?  Notified by CMA that patient is not active with hospice, no additional follow up required.     Goals Addressed             This Visit's Progress    Develop Plan of care for Management of CHF   Not on track    Care Coordination Interventions: Basic overview and discussion of pathophysiology of Heart Failure reviewed Provided education on low sodium diet Reviewed Heart Failure Action Plan in depth and provided written copy Assessed need for readable accurate scales in home Advised patient to weigh each morning after emptying bladder Discussed importance of daily weight and advised patient to weigh and record daily Reviewed role of diuretics in prevention of fluid overload and management of heart failure; Discussed the importance of keeping all appointments with provider Screening for signs and symptoms of depression related to chronic disease state  Assessed social determinant of health barriers  Request made to PCP for home health RN for home labs as well as for possible palliative care referral. Patient was active in the past with palliative, case ended on 11/21/21.  Confirmed patient has [facility] already active for PT/OT sessions.  Discussed medication management, especially Lasix, '20mg'$  twice a day for fluid management. Discussed breathing techniques/exercises as daughter report patient's oxygen levels decrease with slight activity.  Decreases down to 60s at times, increases with deep breathing and use of incentive spirometry  Update 2/13 - Case closed, patient now on hospice         SDOH assessments and interventions completed:  No     Care Coordination Interventions:  Yes, provided   Follow up  plan: No further intervention required.   Encounter Outcome:  Pt. Visit Completed   Valente David, RN, MSN, [facility] Care Management Coordinator [phone]

## 2022-02-13 NOTE — Telephone Encounter (Signed)
Yes that is correct. I spoke with [facility] intake referral yesterday on phone. I did provide this information.  But please, call them back and tell them that I would agree to serve as her attending.  Nobie Putnam, DO [facility] 02/13/2022, 1:49 PM

## 2022-02-14 ENCOUNTER — Encounter: Payer: Self-pay | Admitting: *Deleted

## 2022-02-14 DIAGNOSIS — J9610 Chronic respiratory failure, unspecified whether with hypoxia or hypercapnia: Secondary | ICD-10-CM | POA: Diagnosis not present

## 2022-02-14 DIAGNOSIS — I4891 Unspecified atrial fibrillation: Secondary | ICD-10-CM | POA: Diagnosis not present

## 2022-02-14 DIAGNOSIS — N1832 Chronic kidney disease, stage 3b: Secondary | ICD-10-CM | POA: Diagnosis not present

## 2022-02-14 DIAGNOSIS — F0390 Unspecified dementia without behavioral disturbance: Secondary | ICD-10-CM | POA: Diagnosis not present

## 2022-02-14 DIAGNOSIS — I509 Heart failure, unspecified: Secondary | ICD-10-CM | POA: Diagnosis not present

## 2022-02-14 DIAGNOSIS — I130 Hypertensive heart and chronic kidney disease with heart failure and stage 1 through stage 4 chronic kidney disease, or unspecified chronic kidney disease: Secondary | ICD-10-CM | POA: Diagnosis not present

## 2022-02-14 NOTE — Telephone Encounter (Signed)
I have called Mitzi with hospice and discussed the case and clarified orders.  She will discuss the fluid situation with patient/family, my recommendation was to dose increase lasix from 20 to 40 TWICE A DAY for 3 days and see response. She may review this with cardiology / family as well.  Nobie Putnam, [facility] 02/14/2022, 6:46 PM

## 2022-02-15 NOTE — Telephone Encounter (Signed)
I have spoken with Leslie Johnston in regards to Dr. Raliegh Ip serving as her attending provider.

## 2022-02-20 DIAGNOSIS — I4891 Unspecified atrial fibrillation: Secondary | ICD-10-CM | POA: Diagnosis not present

## 2022-02-20 DIAGNOSIS — I509 Heart failure, unspecified: Secondary | ICD-10-CM | POA: Diagnosis not present

## 2022-02-20 DIAGNOSIS — I130 Hypertensive heart and chronic kidney disease with heart failure and stage 1 through stage 4 chronic kidney disease, or unspecified chronic kidney disease: Secondary | ICD-10-CM | POA: Diagnosis not present

## 2022-02-20 DIAGNOSIS — F0390 Unspecified dementia without behavioral disturbance: Secondary | ICD-10-CM | POA: Diagnosis not present

## 2022-02-20 DIAGNOSIS — N1832 Chronic kidney disease, stage 3b: Secondary | ICD-10-CM | POA: Diagnosis not present

## 2022-02-20 DIAGNOSIS — J9610 Chronic respiratory failure, unspecified whether with hypoxia or hypercapnia: Secondary | ICD-10-CM | POA: Diagnosis not present

## 2022-02-23 ENCOUNTER — Other Ambulatory Visit: Payer: Self-pay | Admitting: Cardiovascular Disease

## 2022-02-23 ENCOUNTER — Ambulatory Visit: Payer: Self-pay | Admitting: *Deleted

## 2022-02-23 ENCOUNTER — Other Ambulatory Visit: Payer: Self-pay | Admitting: *Deleted

## 2022-02-23 DIAGNOSIS — I129 Hypertensive chronic kidney disease with stage 1 through stage 4 chronic kidney disease, or unspecified chronic kidney disease: Secondary | ICD-10-CM

## 2022-02-23 DIAGNOSIS — I4891 Unspecified atrial fibrillation: Secondary | ICD-10-CM

## 2022-02-23 DIAGNOSIS — N183 Chronic kidney disease, stage 3 unspecified: Secondary | ICD-10-CM

## 2022-02-23 MED ORDER — METOPROLOL TARTRATE 50 MG PO TABS: 50.0000 mg | ORAL_TABLET | Freq: Two times a day (BID) | ORAL | 2 refills | Status: DC

## 2022-02-23 MED ORDER — AMIODARONE HCL 200 MG PO TABS: 200.0000 mg | ORAL_TABLET | Freq: Every day | ORAL | 0 refills | Status: DC

## 2022-02-23 MED ORDER — DILTIAZEM HCL ER COATED BEADS 180 MG PO CP24: ORAL_CAPSULE | ORAL | 2 refills | Status: DC

## 2022-02-23 MED ORDER — AMIODARONE HCL 200 MG PO TABS: 200.0000 mg | ORAL_TABLET | Freq: Every day | ORAL | 1 refills | Status: DC

## 2022-02-23 NOTE — Telephone Encounter (Signed)
Please let [facility] know the update.  Can call Mitzi at [phone]  I spoke with Dr Rockey Situ.  He recommends keep Amiodarone '200mg'$  daily. Continue course. New rx already sent. To pharmacy 90 pill count.  I called pharmacy, voided the 4 tablet order. And they will fill the 90 pill count.  Nobie Putnam, [facility] 02/23/2022, 5:35 PM

## 2022-02-23 NOTE — Telephone Encounter (Signed)
  Chief Complaint: medication question regarding refill of amiodarone 200 mg  Symptoms: na   Frequency: na Pertinent Negatives: Patient denies na Disposition: []$ ED /[]$ Urgent Care (no appt availability in office) / []$ Appointment(In office/virtual)/ []$  [place] [facility]/ []$ Home Care/ []$ Refused Recommended Disposition /[]$ [place] [facility]/ [x]$  Follow-up with PCP Additional Notes: [place] called to request medication refills. Please advise if amiodarone 200 mg should be continued. Last ordered by Ralene Muskrat, MD tapered drug to end 03/02/22. Patient only has 3 tablets left until 02/26/22. Will need 4 tablets refilled to continue to 03/02/22. Please advise and Hospice nurse would like a call back to confirm. [phone].    Reason for Disposition  [1] Caller has URGENT medicine question about med that PCP or specialist prescribed AND [2] triager unable to answer question  Answer Assessment - Initial Assessment Questions 1. NAME of MEDICINE: "What medicine(s) are you calling about?"     Amiodarone 200 mg  2. QUESTION: "What is your question?" (e.g., double dose of medicine, side effect)     Does the patient need to continue medication after 03/02/22 as prescribed ? And if so patient will need refill for 4 tablets. Patient only has 3 pills left 3. PRESCRIBER: "Who prescribed the medicine?" Reason: if prescribed by specialist, call should be referred to that group.     Ralene Muskrat, MD 4. SYMPTOMS: "Do you have any symptoms?" If Yes, ask: "What symptoms are you having?"  "How bad are the symptoms (e.g., mild, moderate, severe)     In Hospice  5. PREGNANCY:  "Is there any chance that you are pregnant?" "When was your last menstrual period?"     na  Protocols used: Medication Question Call-A-AH

## 2022-02-23 NOTE — Telephone Encounter (Signed)
Called [facility].  I do not know the answer for the Cardiology medication question on Amiodarone. I see that the rx ends after her last 3 pills but they did advise to continue to 3/1. I will add 4 pills on a re order and contact Cardiology see if we can clarify.  I re ordered other meds dilt and metop  Nobie Putnam, DO [facility] 02/23/2022, 12:33 PM

## 2022-02-23 NOTE — Telephone Encounter (Signed)
Requested medication (s) are due for refill today - yes  Requested medication (s) are on the active medication list -yes  Future visit scheduled -no  Last refill: metoprolol-01/29/22 #60                 Amiodarone- 01/29/22 #36                  Diltiazem- 08/21/21 #90 3RF   Notes to clinic: sent for review - all outside provider- ED, cardiology   Requested Prescriptions  Pending Prescriptions Disp Refills   metoprolol tartrate (LOPRESSOR) 50 MG tablet 60 tablet 0    Sig: Take 1 tablet (50 mg total) by mouth 2 (two) times daily.     Cardiovascular:  Beta Blockers Passed - 02/23/2022 10:43 AM      Passed - Last BP in normal range    BP Readings from Last 1 Encounters:  02/07/22 117/75         Passed - Last Heart Rate in normal range    Pulse Readings from Last 1 Encounters:  02/07/22 78         Passed - Valid encounter within last 6 months    Recent Outpatient Visits           1 week ago Acute diastolic CHF (congestive heart failure) [facility])   [place], DO   5 months ago Recurrent lower abdominal pain   [facility] Olin Hauser, DO   1 year ago Postmenopausal osteoporosis with pathological fracture of humerus   [place], DO   1 year ago Compression fracture of thoracic vertebra, unspecified thoracic vertebral level, sequela   [place], DO   1 year ago Chronic bilateral low back pain without sciatica   [facility] [place], Devonne Doughty, DO       Future Appointments             In 1 month Gollan, Kathlene November, MD [place] at [facility]             diltiazem [facility] CD) 180 MG 24 hr capsule 90 capsule 3     Cardiovascular: Calcium Channel Blockers 3 Failed - 02/23/2022 10:43 AM      Failed - Cr in normal range and within  360 days    Creat  Date Value Ref Range Status  04/23/2019 0.91 (H) 0.60 - 0.88 mg/dL Final    Comment:    For patients >8 years of age, the reference limit for Creatinine is approximately 13% higher for people identified as African-American. .    Creatinine, Ser  Date Value Ref Range Status  01/29/2022 1.06 (H) 0.44 - 1.00 mg/dL Final         Passed - ALT in normal range and within 360 days    ALT  Date Value Ref Range Status  08/14/2021 11 0 - 44 U/L Final         Passed - AST in normal range and within 360 days    AST  Date Value Ref Range Status  08/14/2021 19 15 - 41 U/L Final         Passed - Last BP in normal range    BP Readings from Last 1 Encounters:  02/07/22 117/75         Passed - Last Heart Rate  in normal range    Pulse Readings from Last 1 Encounters:  02/07/22 78         Passed - Valid encounter within last 6 months    Recent Outpatient Visits           1 week ago Acute diastolic CHF (congestive heart failure) [facility])   [place], DO   5 months ago Recurrent lower abdominal pain   [place], DO   1 year ago Postmenopausal osteoporosis with pathological fracture of humerus   [place], DO   1 year ago Compression fracture of thoracic vertebra, unspecified thoracic vertebral level, sequela   [facility], Devonne Doughty, DO   1 year ago Chronic bilateral low back pain without sciatica   [facility] Olin Hauser, DO       Future Appointments             In 1 month Gollan, Kathlene November, MD [place] at [facility]             amiodarone (PACERONE) 200 MG tablet 36 tablet 0    Sig: Take 1 tablet (200 mg total) by mouth 2 (two) times daily for 3 days, THEN 1 tablet (200 mg total) daily.     Not  Delegated - Cardiovascular: Antiarrhythmic Agents - amiodarone Failed - 02/23/2022 10:43 AM      Failed - This refill cannot be delegated      Failed - Manual Review: Eye exam recommended every 12 months      Failed - Mg Level in normal range and within 360 days    Magnesium  Date Value Ref Range Status  05/16/2020 2.3 1.7 - 2.4 mg/dL Final    Comment:    Performed at [facility], [address] [place], [place] [address]         Failed - AST in normal range and within 180 days    AST  Date Value Ref Range Status  08/14/2021 19 15 - 41 U/L Final         Failed - ALT in normal range and within 180 days    ALT  Date Value Ref Range Status  08/14/2021 11 0 - 44 U/L Final         Failed - Patient had chest x-ray within the last 6 months      Passed - TSH in normal range and within 360 days    TSH  Date Value Ref Range Status  01/25/2022 1.050 0.350 - 4.500 uIU/mL Final    Comment:    Performed by a 3rd Generation assay with a functional sensitivity of <=0.01 uIU/mL. Performed at [facility], [address] [place], [place] [address]   04/23/2019 1.43 0.40 - 4.50 mIU/L Final         Passed - K in normal range and within 180 days    Potassium  Date Value Ref Range Status  01/29/2022 3.8 3.5 - 5.1 mmol/L Final         Passed - Patient had ECG in the last 180 days      Passed - Patient is not pregnant      Passed - Last BP in normal range    BP Readings from Last 1 Encounters:  02/07/22 117/75  Passed - Last Heart Rate in normal range    Pulse Readings from Last 1 Encounters:  02/07/22 78         Passed - Valid encounter within last 6 months    Recent Outpatient Visits           1 week ago Acute diastolic CHF (congestive heart failure) [facility])   [place], DO   5 months ago Recurrent lower abdominal pain   [facility] Olin Hauser, DO    1 year ago Postmenopausal osteoporosis with pathological fracture of humerus   [place], DO   1 year ago Compression fracture of thoracic vertebra, unspecified thoracic vertebral level, sequela   [place], DO   1 year ago Chronic bilateral low back pain without sciatica   [facility] Olin Hauser, DO       Future Appointments             In 1 month Gollan, Kathlene November, MD [place] at [facility]               Requested Prescriptions  Pending Prescriptions Disp Refills   metoprolol tartrate (LOPRESSOR) 50 MG tablet 60 tablet 0    Sig: Take 1 tablet (50 mg total) by mouth 2 (two) times daily.     Cardiovascular:  Beta Blockers Passed - 02/23/2022 10:43 AM      Passed - Last BP in normal range    BP Readings from Last 1 Encounters:  02/07/22 117/75         Passed - Last Heart Rate in normal range    Pulse Readings from Last 1 Encounters:  02/07/22 78         Passed - Valid encounter within last 6 months    Recent Outpatient Visits           1 week ago Acute diastolic CHF (congestive heart failure) [facility])   [place], DO   5 months ago Recurrent lower abdominal pain   [facility] Olin Hauser, DO   1 year ago Postmenopausal osteoporosis with pathological fracture of humerus   [place], DO   1 year ago Compression fracture of thoracic vertebra, unspecified thoracic vertebral level, sequela   [place], DO   1 year ago Chronic bilateral low back pain without sciatica   [facility] [place], Devonne Doughty, DO       Future Appointments             In 1 month Gollan, Kathlene November,  MD [place] at [facility]             diltiazem [facility] [place] CD) 180 MG 24 hr capsule 90 capsule 3     Cardiovascular: Calcium Channel Blockers 3 Failed - 02/23/2022 10:43 AM      Failed - Cr in normal range and within 360 days    Creat  Date Value Ref Range Status  04/23/2019 0.91 (H) 0.60 - 0.88 mg/dL Final    Comment:    For patients >60 years of age, the reference limit for Creatinine is approximately 13% higher for people identified as African-American. Marland Kitchen  Creatinine, Ser  Date Value Ref Range Status  01/29/2022 1.06 (H) 0.44 - 1.00 mg/dL Final         Passed - ALT in normal range and within 360 days    ALT  Date Value Ref Range Status  08/14/2021 11 0 - 44 U/L Final         Passed - AST in normal range and within 360 days    AST  Date Value Ref Range Status  08/14/2021 19 15 - 41 U/L Final         Passed - Last BP in normal range    BP Readings from Last 1 Encounters:  02/07/22 117/75         Passed - Last Heart Rate in normal range    Pulse Readings from Last 1 Encounters:  02/07/22 78         Passed - Valid encounter within last 6 months    Recent Outpatient Visits           1 week ago Acute diastolic CHF (congestive heart failure) [facility])   [facility] [employer], Devonne Doughty, DO   5 months ago Recurrent lower abdominal pain   [place], DO   1 year ago Postmenopausal osteoporosis with pathological fracture of humerus   [place], DO   1 year ago Compression fracture of thoracic vertebra, unspecified thoracic vertebral level, sequela   [place], DO   1 year ago Chronic bilateral low back pain without sciatica   [facility] [place], Devonne Doughty, DO       Future Appointments             In 1 month Gollan,  Kathlene November, MD [place] at [facility]             amiodarone (PACERONE) 200 MG tablet 36 tablet 0    Sig: Take 1 tablet (200 mg total) by mouth 2 (two) times daily for 3 days, THEN 1 tablet (200 mg total) daily.     Not Delegated - Cardiovascular: Antiarrhythmic Agents - amiodarone Failed - 02/23/2022 10:43 AM      Failed - This refill cannot be delegated      Failed - Manual Review: Eye exam recommended every 12 months      Failed - Mg Level in normal range and within 360 days    Magnesium  Date Value Ref Range Status  05/16/2020 2.3 1.7 - 2.4 mg/dL Final    Comment:    Performed at [facility], [address] [place], [place] [address]         Failed - AST in normal range and within 180 days    AST  Date Value Ref Range Status  08/14/2021 19 15 - 41 U/L Final         Failed - ALT in normal range and within 180 days    ALT  Date Value Ref Range Status  08/14/2021 11 0 - 44 U/L Final         Failed - Patient had chest x-ray within the last 6 months      Passed - TSH in normal range and within 360 days    TSH  Date Value Ref Range Status  01/25/2022 1.050 0.350 - 4.500 uIU/mL Final    Comment:  Performed by a 3rd Generation assay with a functional sensitivity of <=0.01 uIU/mL. Performed at [facility], [address] [place], [place] [address]   04/23/2019 1.43 0.40 - 4.50 mIU/L Final         Passed - K in normal range and within 180 days    Potassium  Date Value Ref Range Status  01/29/2022 3.8 3.5 - 5.1 mmol/L Final         Passed - Patient had ECG in the last 180 days      Passed - Patient is not pregnant      Passed - Last BP in normal range    BP Readings from Last 1 Encounters:  02/07/22 117/75         Passed - Last Heart Rate in normal range    Pulse Readings from Last 1 Encounters:  02/07/22 78         Passed - Valid encounter within last 6 months    Recent Outpatient Visits           1 week ago Acute  diastolic CHF (congestive heart failure) [facility])   [place], DO   5 months ago Recurrent lower abdominal pain   [facility] Olin Hauser, DO   1 year ago Postmenopausal osteoporosis with pathological fracture of humerus   [place], DO   1 year ago Compression fracture of thoracic vertebra, unspecified thoracic vertebral level, sequela   [place], DO   1 year ago Chronic bilateral low back pain without sciatica   [place], DO       Future Appointments             In 1 month Gollan, Kathlene November, MD [place] at [facility]

## 2022-02-27 DIAGNOSIS — N1832 Chronic kidney disease, stage 3b: Secondary | ICD-10-CM | POA: Diagnosis not present

## 2022-02-27 DIAGNOSIS — F0390 Unspecified dementia without behavioral disturbance: Secondary | ICD-10-CM | POA: Diagnosis not present

## 2022-02-27 DIAGNOSIS — J9610 Chronic respiratory failure, unspecified whether with hypoxia or hypercapnia: Secondary | ICD-10-CM | POA: Diagnosis not present

## 2022-02-27 DIAGNOSIS — I130 Hypertensive heart and chronic kidney disease with heart failure and stage 1 through stage 4 chronic kidney disease, or unspecified chronic kidney disease: Secondary | ICD-10-CM | POA: Diagnosis not present

## 2022-02-27 DIAGNOSIS — I509 Heart failure, unspecified: Secondary | ICD-10-CM | POA: Diagnosis not present

## 2022-02-27 DIAGNOSIS — I4891 Unspecified atrial fibrillation: Secondary | ICD-10-CM | POA: Diagnosis not present

## 2022-03-02 DIAGNOSIS — I4891 Unspecified atrial fibrillation: Secondary | ICD-10-CM | POA: Diagnosis not present

## 2022-03-02 DIAGNOSIS — I130 Hypertensive heart and chronic kidney disease with heart failure and stage 1 through stage 4 chronic kidney disease, or unspecified chronic kidney disease: Secondary | ICD-10-CM | POA: Diagnosis not present

## 2022-03-02 DIAGNOSIS — N1832 Chronic kidney disease, stage 3b: Secondary | ICD-10-CM | POA: Diagnosis not present

## 2022-03-02 DIAGNOSIS — F0390 Unspecified dementia without behavioral disturbance: Secondary | ICD-10-CM | POA: Diagnosis not present

## 2022-03-02 DIAGNOSIS — J302 Other seasonal allergic rhinitis: Secondary | ICD-10-CM | POA: Diagnosis not present

## 2022-03-02 DIAGNOSIS — M810 Age-related osteoporosis without current pathological fracture: Secondary | ICD-10-CM | POA: Diagnosis not present

## 2022-03-02 DIAGNOSIS — K219 Gastro-esophageal reflux disease without esophagitis: Secondary | ICD-10-CM | POA: Diagnosis not present

## 2022-03-02 DIAGNOSIS — I509 Heart failure, unspecified: Secondary | ICD-10-CM | POA: Diagnosis not present

## 2022-03-02 DIAGNOSIS — J9610 Chronic respiratory failure, unspecified whether with hypoxia or hypercapnia: Secondary | ICD-10-CM | POA: Diagnosis not present

## 2022-03-07 DIAGNOSIS — I4891 Unspecified atrial fibrillation: Secondary | ICD-10-CM | POA: Diagnosis not present

## 2022-03-07 DIAGNOSIS — I509 Heart failure, unspecified: Secondary | ICD-10-CM | POA: Diagnosis not present

## 2022-03-07 DIAGNOSIS — I130 Hypertensive heart and chronic kidney disease with heart failure and stage 1 through stage 4 chronic kidney disease, or unspecified chronic kidney disease: Secondary | ICD-10-CM | POA: Diagnosis not present

## 2022-03-07 DIAGNOSIS — N1832 Chronic kidney disease, stage 3b: Secondary | ICD-10-CM | POA: Diagnosis not present

## 2022-03-07 DIAGNOSIS — F0390 Unspecified dementia without behavioral disturbance: Secondary | ICD-10-CM | POA: Diagnosis not present

## 2022-03-07 DIAGNOSIS — J9610 Chronic respiratory failure, unspecified whether with hypoxia or hypercapnia: Secondary | ICD-10-CM | POA: Diagnosis not present

## 2022-03-09 DIAGNOSIS — N1832 Chronic kidney disease, stage 3b: Secondary | ICD-10-CM | POA: Diagnosis not present

## 2022-03-09 DIAGNOSIS — J9610 Chronic respiratory failure, unspecified whether with hypoxia or hypercapnia: Secondary | ICD-10-CM | POA: Diagnosis not present

## 2022-03-09 DIAGNOSIS — I130 Hypertensive heart and chronic kidney disease with heart failure and stage 1 through stage 4 chronic kidney disease, or unspecified chronic kidney disease: Secondary | ICD-10-CM | POA: Diagnosis not present

## 2022-03-09 DIAGNOSIS — I509 Heart failure, unspecified: Secondary | ICD-10-CM | POA: Diagnosis not present

## 2022-03-09 DIAGNOSIS — I4891 Unspecified atrial fibrillation: Secondary | ICD-10-CM | POA: Diagnosis not present

## 2022-03-09 DIAGNOSIS — F0390 Unspecified dementia without behavioral disturbance: Secondary | ICD-10-CM | POA: Diagnosis not present

## 2022-03-13 DIAGNOSIS — I509 Heart failure, unspecified: Secondary | ICD-10-CM | POA: Diagnosis not present

## 2022-03-13 DIAGNOSIS — J9610 Chronic respiratory failure, unspecified whether with hypoxia or hypercapnia: Secondary | ICD-10-CM | POA: Diagnosis not present

## 2022-03-13 DIAGNOSIS — I4891 Unspecified atrial fibrillation: Secondary | ICD-10-CM | POA: Diagnosis not present

## 2022-03-13 DIAGNOSIS — F0390 Unspecified dementia without behavioral disturbance: Secondary | ICD-10-CM | POA: Diagnosis not present

## 2022-03-13 DIAGNOSIS — I130 Hypertensive heart and chronic kidney disease with heart failure and stage 1 through stage 4 chronic kidney disease, or unspecified chronic kidney disease: Secondary | ICD-10-CM | POA: Diagnosis not present

## 2022-03-13 DIAGNOSIS — N1832 Chronic kidney disease, stage 3b: Secondary | ICD-10-CM | POA: Diagnosis not present

## 2022-03-15 ENCOUNTER — Telehealth: Payer: Self-pay | Admitting: Cardiovascular Disease

## 2022-03-15 DIAGNOSIS — J9610 Chronic respiratory failure, unspecified whether with hypoxia or hypercapnia: Secondary | ICD-10-CM | POA: Diagnosis not present

## 2022-03-15 DIAGNOSIS — I509 Heart failure, unspecified: Secondary | ICD-10-CM | POA: Diagnosis not present

## 2022-03-15 DIAGNOSIS — I130 Hypertensive heart and chronic kidney disease with heart failure and stage 1 through stage 4 chronic kidney disease, or unspecified chronic kidney disease: Secondary | ICD-10-CM | POA: Diagnosis not present

## 2022-03-15 DIAGNOSIS — I4891 Unspecified atrial fibrillation: Secondary | ICD-10-CM | POA: Diagnosis not present

## 2022-03-15 DIAGNOSIS — F0390 Unspecified dementia without behavioral disturbance: Secondary | ICD-10-CM | POA: Diagnosis not present

## 2022-03-15 DIAGNOSIS — N1832 Chronic kidney disease, stage 3b: Secondary | ICD-10-CM | POA: Diagnosis not present

## 2022-03-15 NOTE — Telephone Encounter (Signed)
Mitzi from [facility] called stating that the patient had a fall today and is complaining of pain in right hip to inner thigh to knee. Vitals (sitting) BP 88/60 HR 109 RR 24 O2 85% 3LPM. Coarse crackles anterior left lung. Bruise on right forearm observed before fall. Stopped Eliquis due to bruising related to fall. Patient also bilateral lower extremity edema 2+. Requesting medication adjustment due to fall and 2+ edema.

## 2022-03-16 MED ORDER — METOPROLOL TARTRATE 100 MG PO TABS: 100.0000 mg | ORAL_TABLET | Freq: Two times a day (BID) | ORAL | 1 refills | Status: DC

## 2022-03-16 NOTE — Telephone Encounter (Signed)
Minna Merritts, MD  Sent: Thu March 15, 2022  5:25 PM  To: [facility] Triage         Message  Would recommend we stop diltiazem  Increase metoprolol to tartrate up to 100 twice daily  Lasix up to 40 twice a day for 3 days then back down to 20 twice a day  Thx  TGollan

## 2022-03-16 NOTE — Telephone Encounter (Signed)
I called and spoke with Mitzi with [facility]. Per Mitzi, the patient is currently on hospice care.   I advised I was calling with medication orders from Dr. Rockey Situ and inquired if I should give these orders to her or contact the patient/ her family.  Per Mitzi, she will take the orders to update their system and notify the patient/ family.  Advised Mitzi of Dr. Donivan Scull orders for the patient to: 1) STOP Dilitazem 2) INCREASE metoprolol tartrate to 100 mg BID 3) INCREASE Lasix to 40 mg BID x 3 days, then resume 20 mg BID  Mitzi then inquired if Dr. Rockey Situ was ok with the patient discontinuing her Eliquis. I inquired if the patient stopped this on her own, but per Mitzi, when patient's are under [place] and have a fall, they are advised to hold the Eliquis until further recommendations can be obtained from the provider.  The patient was trying to pull open a drawer yesterday and it got stuck- she lost her balance and fell on her right hip. I inquired if the patient hit her head, but Mitzi denies this.   She advised the patient has been a little bit delusional. She is currently on 3L of O2 with sats of ~ 85%.  I advised Mitzi I will need to clarify further orders regarding the patients Eliquis with Dr. Rockey Situ and call her back. Mitzi voices understanding of the above and is agreeable.

## 2022-03-18 DIAGNOSIS — I509 Heart failure, unspecified: Secondary | ICD-10-CM | POA: Diagnosis not present

## 2022-03-18 DIAGNOSIS — N1832 Chronic kidney disease, stage 3b: Secondary | ICD-10-CM | POA: Diagnosis not present

## 2022-03-18 DIAGNOSIS — F0390 Unspecified dementia without behavioral disturbance: Secondary | ICD-10-CM | POA: Diagnosis not present

## 2022-03-18 DIAGNOSIS — I4891 Unspecified atrial fibrillation: Secondary | ICD-10-CM | POA: Diagnosis not present

## 2022-03-18 DIAGNOSIS — I130 Hypertensive heart and chronic kidney disease with heart failure and stage 1 through stage 4 chronic kidney disease, or unspecified chronic kidney disease: Secondary | ICD-10-CM | POA: Diagnosis not present

## 2022-03-18 DIAGNOSIS — J9610 Chronic respiratory failure, unspecified whether with hypoxia or hypercapnia: Secondary | ICD-10-CM | POA: Diagnosis not present

## 2022-03-19 DIAGNOSIS — I130 Hypertensive heart and chronic kidney disease with heart failure and stage 1 through stage 4 chronic kidney disease, or unspecified chronic kidney disease: Secondary | ICD-10-CM | POA: Diagnosis not present

## 2022-03-19 DIAGNOSIS — F0390 Unspecified dementia without behavioral disturbance: Secondary | ICD-10-CM | POA: Diagnosis not present

## 2022-03-19 DIAGNOSIS — N1832 Chronic kidney disease, stage 3b: Secondary | ICD-10-CM | POA: Diagnosis not present

## 2022-03-19 DIAGNOSIS — I4891 Unspecified atrial fibrillation: Secondary | ICD-10-CM | POA: Diagnosis not present

## 2022-03-19 DIAGNOSIS — I509 Heart failure, unspecified: Secondary | ICD-10-CM | POA: Diagnosis not present

## 2022-03-19 DIAGNOSIS — J9610 Chronic respiratory failure, unspecified whether with hypoxia or hypercapnia: Secondary | ICD-10-CM | POA: Diagnosis not present

## 2022-03-19 NOTE — Telephone Encounter (Signed)
Spoke with [place] patients daughter per release form. We discussed Dr. Donivan Scull recommendations in detail. She states that patient has not had a fall and that her balance  has been good so they would like her to restart medication. Advised to let us know immediately if she should have a fall. She verbalized understanding of our conversation with no further questions at this time.   Minna Merritts, MD  Sent: Sun March 18, 2022  3:51 PM  To: [facility] Triage    Message  We can restart Eliquis 2.5 twice daily if family feels her balance is okay to do so  For additional falls we may need to stop Eliquis    Thx  TGollan

## 2022-03-20 DIAGNOSIS — I4891 Unspecified atrial fibrillation: Secondary | ICD-10-CM | POA: Diagnosis not present

## 2022-03-20 DIAGNOSIS — F0390 Unspecified dementia without behavioral disturbance: Secondary | ICD-10-CM | POA: Diagnosis not present

## 2022-03-20 DIAGNOSIS — J9610 Chronic respiratory failure, unspecified whether with hypoxia or hypercapnia: Secondary | ICD-10-CM | POA: Diagnosis not present

## 2022-03-20 DIAGNOSIS — I509 Heart failure, unspecified: Secondary | ICD-10-CM | POA: Diagnosis not present

## 2022-03-20 DIAGNOSIS — I130 Hypertensive heart and chronic kidney disease with heart failure and stage 1 through stage 4 chronic kidney disease, or unspecified chronic kidney disease: Secondary | ICD-10-CM | POA: Diagnosis not present

## 2022-03-20 DIAGNOSIS — N1832 Chronic kidney disease, stage 3b: Secondary | ICD-10-CM | POA: Diagnosis not present

## 2022-03-23 DIAGNOSIS — I4891 Unspecified atrial fibrillation: Secondary | ICD-10-CM | POA: Diagnosis not present

## 2022-03-23 DIAGNOSIS — I509 Heart failure, unspecified: Secondary | ICD-10-CM | POA: Diagnosis not present

## 2022-03-23 DIAGNOSIS — I130 Hypertensive heart and chronic kidney disease with heart failure and stage 1 through stage 4 chronic kidney disease, or unspecified chronic kidney disease: Secondary | ICD-10-CM | POA: Diagnosis not present

## 2022-03-23 DIAGNOSIS — J9610 Chronic respiratory failure, unspecified whether with hypoxia or hypercapnia: Secondary | ICD-10-CM | POA: Diagnosis not present

## 2022-03-23 DIAGNOSIS — N1832 Chronic kidney disease, stage 3b: Secondary | ICD-10-CM | POA: Diagnosis not present

## 2022-03-23 DIAGNOSIS — F0390 Unspecified dementia without behavioral disturbance: Secondary | ICD-10-CM | POA: Diagnosis not present

## 2022-03-28 DIAGNOSIS — N1832 Chronic kidney disease, stage 3b: Secondary | ICD-10-CM | POA: Diagnosis not present

## 2022-03-28 DIAGNOSIS — I509 Heart failure, unspecified: Secondary | ICD-10-CM | POA: Diagnosis not present

## 2022-03-28 DIAGNOSIS — F0390 Unspecified dementia without behavioral disturbance: Secondary | ICD-10-CM | POA: Diagnosis not present

## 2022-03-28 DIAGNOSIS — J9610 Chronic respiratory failure, unspecified whether with hypoxia or hypercapnia: Secondary | ICD-10-CM | POA: Diagnosis not present

## 2022-03-28 DIAGNOSIS — I130 Hypertensive heart and chronic kidney disease with heart failure and stage 1 through stage 4 chronic kidney disease, or unspecified chronic kidney disease: Secondary | ICD-10-CM | POA: Diagnosis not present

## 2022-03-28 DIAGNOSIS — I4891 Unspecified atrial fibrillation: Secondary | ICD-10-CM | POA: Diagnosis not present

## 2022-04-02 DIAGNOSIS — I4891 Unspecified atrial fibrillation: Secondary | ICD-10-CM | POA: Diagnosis not present

## 2022-04-02 DIAGNOSIS — N1832 Chronic kidney disease, stage 3b: Secondary | ICD-10-CM | POA: Diagnosis not present

## 2022-04-02 DIAGNOSIS — K219 Gastro-esophageal reflux disease without esophagitis: Secondary | ICD-10-CM | POA: Diagnosis not present

## 2022-04-02 DIAGNOSIS — I130 Hypertensive heart and chronic kidney disease with heart failure and stage 1 through stage 4 chronic kidney disease, or unspecified chronic kidney disease: Secondary | ICD-10-CM | POA: Diagnosis not present

## 2022-04-02 DIAGNOSIS — J302 Other seasonal allergic rhinitis: Secondary | ICD-10-CM | POA: Diagnosis not present

## 2022-04-02 DIAGNOSIS — F0390 Unspecified dementia without behavioral disturbance: Secondary | ICD-10-CM | POA: Diagnosis not present

## 2022-04-02 DIAGNOSIS — M810 Age-related osteoporosis without current pathological fracture: Secondary | ICD-10-CM | POA: Diagnosis not present

## 2022-04-02 DIAGNOSIS — I509 Heart failure, unspecified: Secondary | ICD-10-CM | POA: Diagnosis not present

## 2022-04-02 DIAGNOSIS — J9610 Chronic respiratory failure, unspecified whether with hypoxia or hypercapnia: Secondary | ICD-10-CM | POA: Diagnosis not present

## 2022-04-05 ENCOUNTER — Other Ambulatory Visit: Payer: Self-pay

## 2022-04-05 ENCOUNTER — Ambulatory Visit: Payer: Self-pay

## 2022-04-05 DIAGNOSIS — J9610 Chronic respiratory failure, unspecified whether with hypoxia or hypercapnia: Secondary | ICD-10-CM | POA: Diagnosis not present

## 2022-04-05 DIAGNOSIS — F0390 Unspecified dementia without behavioral disturbance: Secondary | ICD-10-CM | POA: Diagnosis not present

## 2022-04-05 DIAGNOSIS — I130 Hypertensive heart and chronic kidney disease with heart failure and stage 1 through stage 4 chronic kidney disease, or unspecified chronic kidney disease: Secondary | ICD-10-CM | POA: Diagnosis not present

## 2022-04-05 DIAGNOSIS — N1832 Chronic kidney disease, stage 3b: Secondary | ICD-10-CM | POA: Diagnosis not present

## 2022-04-05 DIAGNOSIS — I4891 Unspecified atrial fibrillation: Secondary | ICD-10-CM | POA: Diagnosis not present

## 2022-04-05 DIAGNOSIS — I509 Heart failure, unspecified: Secondary | ICD-10-CM | POA: Diagnosis not present

## 2022-04-05 MED ORDER — FUROSEMIDE 20 MG PO TABS: 40.0000 mg | ORAL_TABLET | Freq: Two times a day (BID) | ORAL | 0 refills | Status: DC

## 2022-04-05 NOTE — Telephone Encounter (Signed)
  Chief Complaint: leg swelling Symptoms: bilat leg edema, 4+ up to knee Frequency: since Fri or Sat last week Pertinent Negatives: Patient denies SOB or leg pain Disposition: [] ED /[] Urgent Care (no appt availability in office) / [] Appointment(In office/virtual)/ []  [place] [facility]/ [] Home Care/ [] Refused Recommended Disposition /[] [place] Mobile [facility]/ [x]  Follow-up with PCP Additional Notes: Tiffany, RN with [facility] calling to report pt's 4+ leg edema. Denies any other sx. Request increase in Furosemide. Pt currently takes 20mg  BID and has plenty of supply to take extra doses or mg. Advised I would send to provider and have CMA FU with her for verbal order. CB # [id number]   Reason for Disposition . [1] MODERATE leg swelling (e.g., swelling extends up to knees) AND [2] new-onset or worsening  Answer Assessment - Initial Assessment Questions 1. ONSET: "When did the swelling start?" (e.g., minutes, hours, days)     Since Fri/Sat  2. LOCATION: "What part of the leg is swollen?"  "Are both legs swollen or just one leg?"     Bilat  3. SEVERITY: "How bad is the swelling?" (e.g., localized; mild, moderate, severe)   - Localized: Small area of swelling localized to one leg.   - MILD pedal edema: Swelling limited to foot and ankle, pitting edema < 1/4 inch (6 mm) deep, rest and elevation eliminate most or all swelling.   - MODERATE edema: Swelling of lower leg to knee, pitting edema > 1/4 inch (6 mm) deep, rest and elevation only partially reduce swelling.   - SEVERE edema: Swelling extends above knee, facial or hand swelling present.      4+ edema BLE  4. REDNESS: "Does the swelling look red or infected?"     no 5. PAIN: "Is the swelling painful to touch?" If Yes, ask: "How painful is it?"   (Scale 1-10; mild, moderate or severe)     no 10. OTHER SYMPTOMS: "Do you have any other symptoms?" (e.g., chest pain, difficulty breathing)  Protocols used: Leg Swelling and  Edema-A-AH

## 2022-04-05 NOTE — Telephone Encounter (Signed)
I agree with request to dose increase Lasix from 20mg  TWICE A DAY to 40mg  TWICE A DAY at max dose for shorter term or until needed for swelling.  Our office attempted to call Tiffany [facility]) back around 450pm today 04/05/22. Did not reach her. We were able to leave a message.  Rx was updated on her chart. But no new rx sent to pharmacy. She has plenty of current medication to take increase dosed.  Nobie Putnam, [facility] 04/05/2022, 6:05 PM

## 2022-04-09 ENCOUNTER — Telehealth: Payer: Self-pay | Admitting: Family Medicine

## 2022-04-09 DIAGNOSIS — J9610 Chronic respiratory failure, unspecified whether with hypoxia or hypercapnia: Secondary | ICD-10-CM | POA: Diagnosis not present

## 2022-04-09 DIAGNOSIS — F0390 Unspecified dementia without behavioral disturbance: Secondary | ICD-10-CM | POA: Diagnosis not present

## 2022-04-09 DIAGNOSIS — N1832 Chronic kidney disease, stage 3b: Secondary | ICD-10-CM | POA: Diagnosis not present

## 2022-04-09 DIAGNOSIS — I4891 Unspecified atrial fibrillation: Secondary | ICD-10-CM | POA: Diagnosis not present

## 2022-04-09 DIAGNOSIS — I509 Heart failure, unspecified: Secondary | ICD-10-CM | POA: Diagnosis not present

## 2022-04-09 DIAGNOSIS — I130 Hypertensive heart and chronic kidney disease with heart failure and stage 1 through stage 4 chronic kidney disease, or unspecified chronic kidney disease: Secondary | ICD-10-CM | POA: Diagnosis not present

## 2022-04-09 MED ORDER — DIPHENHYDRAMINE HCL 25 MG PO TABS: 25.0000 mg | ORAL_TABLET | Freq: Three times a day (TID) | ORAL | 0 refills | Status: DC | PRN

## 2022-04-09 NOTE — Telephone Encounter (Signed)
Called Brandy back. Verbal order given for Benadryl 25mg  q 8 hr AS NEEDED itching to take with Morphine. Call back if need to adjust for side effect or other issues. They preferred to avoid hydroxyzine and preferred benadryl.  Saralyn Pilar, DO [facility] 04/09/2022, 4:01 PM

## 2022-04-09 NOTE — Telephone Encounter (Addendum)
Nurse Gearldine Bienenstock with [facility] stated that Pt is taking a low dose of morphine. 25 milliliters at bedtime and is experiencing some itching, requesting an order for Benadryl.  Please advise.

## 2022-04-10 ENCOUNTER — Ambulatory Visit: Payer: Medicare Other | Admitting: Cardiovascular Disease

## 2022-04-12 ENCOUNTER — Ambulatory Visit: Payer: Self-pay | Admitting: *Deleted

## 2022-04-12 DIAGNOSIS — I509 Heart failure, unspecified: Secondary | ICD-10-CM | POA: Diagnosis not present

## 2022-04-12 DIAGNOSIS — I130 Hypertensive heart and chronic kidney disease with heart failure and stage 1 through stage 4 chronic kidney disease, or unspecified chronic kidney disease: Secondary | ICD-10-CM | POA: Diagnosis not present

## 2022-04-12 DIAGNOSIS — I4891 Unspecified atrial fibrillation: Secondary | ICD-10-CM | POA: Diagnosis not present

## 2022-04-12 DIAGNOSIS — J9610 Chronic respiratory failure, unspecified whether with hypoxia or hypercapnia: Secondary | ICD-10-CM | POA: Diagnosis not present

## 2022-04-12 DIAGNOSIS — N1832 Chronic kidney disease, stage 3b: Secondary | ICD-10-CM | POA: Diagnosis not present

## 2022-04-12 DIAGNOSIS — F0390 Unspecified dementia without behavioral disturbance: Secondary | ICD-10-CM | POA: Diagnosis not present

## 2022-04-12 NOTE — Telephone Encounter (Signed)
Reason for Disposition  [1] Caller requesting NON-URGENT health information AND [2] PCP's office is the best resource  Answer Assessment - Initial Assessment Questions 1. REASON FOR CALL or QUESTION: "What is your reason for calling today?" or "How can I best help you?" or "What question do you have that I can help answer?"     Lasix 20 mg twice a day for 7 days.   Did not work.     Pitting edema to the knees.   A month ago Cardizem discontinued 180 mg.   She probably needs another dose of that and Lasix again.     She can be reached [phone] Tiffany  Protocols used: Information Only Call - No Triage-A-AH

## 2022-04-12 NOTE — Telephone Encounter (Signed)
  Chief Complaint: Tiffany with Hospice called in.  The Lasix 20 mg twice a week for 7 days did not help.   Symptoms: Both legs with 4 plus pitting edema Frequency: Today during home visit Pertinent Negatives: Patient denies N/A Disposition: [] ED /[] Urgent Care (no appt availability in office) / [] Appointment(In office/virtual)/ []  [place] [facility]/ [] Home Care/ [] Refused Recommended Disposition /[] [place] Mobile [facility]/ [x]  Follow-up with PCP Additional Notes: Tiffany can be reached at [phone].  She is recommending Lasix again and also the Cardizem was discontinued a month ago the 180 mg.   She feels he needs that again too.

## 2022-04-13 NOTE — Telephone Encounter (Signed)
Okay to repeat Lasix 40 mg twice daily x 7 days.  If she able to wrap her legs in [place] boots?

## 2022-04-13 NOTE — Telephone Encounter (Signed)
LMTCB 04/13/2022.  PEC please advist Tiffany as below.   Thanks,   -Vernona Rieger

## 2022-04-17 ENCOUNTER — Telehealth: Payer: Self-pay | Admitting: Cardiovascular Disease

## 2022-04-17 DIAGNOSIS — I130 Hypertensive heart and chronic kidney disease with heart failure and stage 1 through stage 4 chronic kidney disease, or unspecified chronic kidney disease: Secondary | ICD-10-CM | POA: Diagnosis not present

## 2022-04-17 DIAGNOSIS — F0390 Unspecified dementia without behavioral disturbance: Secondary | ICD-10-CM | POA: Diagnosis not present

## 2022-04-17 DIAGNOSIS — I4891 Unspecified atrial fibrillation: Secondary | ICD-10-CM | POA: Diagnosis not present

## 2022-04-17 DIAGNOSIS — J9610 Chronic respiratory failure, unspecified whether with hypoxia or hypercapnia: Secondary | ICD-10-CM | POA: Diagnosis not present

## 2022-04-17 DIAGNOSIS — N1832 Chronic kidney disease, stage 3b: Secondary | ICD-10-CM | POA: Diagnosis not present

## 2022-04-17 DIAGNOSIS — I509 Heart failure, unspecified: Secondary | ICD-10-CM | POA: Diagnosis not present

## 2022-04-17 NOTE — Telephone Encounter (Signed)
Spoke with nurse with [facility] nurse and she was inquiring about patients medications. She states that family and patient are wanting to clarify what medications she should be taking. They state changes were made at discharge in January. Reviewed that we saw her in February and medications were reviewed. Inquired who I should call to review this information and she stated to call [facility]. She was appreciative for the call back with no further concerns at this time.

## 2022-04-17 NOTE — Telephone Encounter (Signed)
Patient is receiving services through [facility]. They noticed some changes in her blood pressures and felt she was taking too much medication. They reported that prior to her admission in January she was on diltiazem and that she had been doing better. They requested that I reach out to her daughter to review their concerns. Spoke with daughter and she reports that [facility] noted she was on 2 medications that can lower blood pressures Metoprolol & amiodarone. [facility] felt she needed adjustments to see if changes need to be made. Daughter said all of these requests were based on [facility] statements. Daughter did mention some swelling. Daughter does not want her drugged up and at risk for falls with low blood pressures. They are giving her medication for her anxiety and morphine for pain. Patient keeps saying she can't breath but her oxygen levels are normal. Daughter states that all of these changes were at the direction of [facility]. Patient is unable to come in for appointment due to her weakness.  Advised that I would forward this to provider to see if he has any further recommendations.   Heart rates are in the 100's.   4/16 - 118/62 4/8 - 102/60 3/28 - 122/50 3/14 - 88/60 3/12 - 128/70

## 2022-04-17 NOTE — Telephone Encounter (Signed)
Pt c/o BP issue: STAT if pt c/o blurred vision, one-sided weakness or slurred speech  1. What are your last 5 BP readings?   4/16 - 118/62 4/8 - 102/60 3/28 - 122/50 3/14 - 88/60 3/12 - 128/70  2. Are you having any other symptoms (ex. Dizziness, headache, blurred vision, passed out)?   Legs hurt due to edema  3. What is your BP issue?    Caller wants to know if patient can have a medication change to her medication.  Caller stated patient is still having pitting edema and dyspnea with exertion.

## 2022-04-17 NOTE — Telephone Encounter (Signed)
Upon reviewing chart, it appears the patient's diltiazem was discontinued by her primary cardiologist on telephone note dated 03/15/2022.  He recommended the patient increase metoprolol to tartrate to 100 mg twice daily.  Oral amiodarone does not affect blood pressure, though can reduce heart rate some.  Heart rates were noted to be in the 100s bpm on phone note.  Cardiology and PCP have weighed in on diuresis since she was last seen as well.  Ongoing management will be difficult given advanced age, frail state, and inability to come in for office visits.  If patient is having difficulty breathing recommend ED evaluation.  Blood pressure readings are overall stable outside of one reading on 3/14 of 88/60.  Options are somewhat limited for rate control in an effort to minimize relative hypotension.  Digoxin is not a good option given advanced age.  Will route to primary cardiologist for further recommendations.

## 2022-04-19 ENCOUNTER — Telehealth: Payer: Self-pay | Admitting: Family Medicine

## 2022-04-19 DIAGNOSIS — J9610 Chronic respiratory failure, unspecified whether with hypoxia or hypercapnia: Secondary | ICD-10-CM | POA: Diagnosis not present

## 2022-04-19 DIAGNOSIS — I509 Heart failure, unspecified: Secondary | ICD-10-CM | POA: Diagnosis not present

## 2022-04-19 DIAGNOSIS — F0390 Unspecified dementia without behavioral disturbance: Secondary | ICD-10-CM | POA: Diagnosis not present

## 2022-04-19 DIAGNOSIS — R630 Anorexia: Secondary | ICD-10-CM

## 2022-04-19 DIAGNOSIS — I4891 Unspecified atrial fibrillation: Secondary | ICD-10-CM | POA: Diagnosis not present

## 2022-04-19 DIAGNOSIS — I130 Hypertensive heart and chronic kidney disease with heart failure and stage 1 through stage 4 chronic kidney disease, or unspecified chronic kidney disease: Secondary | ICD-10-CM | POA: Diagnosis not present

## 2022-04-19 DIAGNOSIS — N1832 Chronic kidney disease, stage 3b: Secondary | ICD-10-CM | POA: Diagnosis not present

## 2022-04-19 MED ORDER — MIRTAZAPINE 15 MG PO TABS: 15.0000 mg | ORAL_TABLET | Freq: Every day | ORAL | 1 refills | Status: DC

## 2022-04-19 NOTE — Telephone Encounter (Signed)
Spoke with patients daughter and reviewed provider recommendations. She verbalized understanding and verbalized understanding of all information we reviewed. She had no further questions at this time.    Antonieta Iba, MD  [facility] [phone] hours ago (10:17 PM)    I agree with Alycia Rossetti I think we continue her current dose of metoprolol and amiodarone Also continue Lasix Diltiazem can contribute to leg swelling Not a good candidate for cardioversion given age, frailty Morphine can drop the blood pressure and would recommend after receiving anxiety medicines or morphine, that she be aware of the potential for low blood pressure Thx TGollan

## 2022-04-19 NOTE — Telephone Encounter (Signed)
Home Health Verbal Orders - Caller/Agency: Tiffany/[facility] Callback Number: [phone]  Requesting OT/PT/Skilled Nursing/Social Work/Speech Therapy: N/A Frequency: N/A  Requesting an order to increase medication mirtazapine (REMERON) 7.5 MG tablet  from  7.5 to  at bedtime   Please advise.

## 2022-04-19 NOTE — Telephone Encounter (Signed)
Okay to proceed w/ verbal nursing/PT orders.  I have sent rx Mirtazapine new rx dose  nightly to pharmacy.  Saralyn Pilar, DO [facility] 04/19/2022, 3:55 PM

## 2022-04-20 NOTE — Telephone Encounter (Signed)
Tiffany with [facility] notified.

## 2022-04-23 DIAGNOSIS — N1832 Chronic kidney disease, stage 3b: Secondary | ICD-10-CM | POA: Diagnosis not present

## 2022-04-23 DIAGNOSIS — I4891 Unspecified atrial fibrillation: Secondary | ICD-10-CM | POA: Diagnosis not present

## 2022-04-23 DIAGNOSIS — F0390 Unspecified dementia without behavioral disturbance: Secondary | ICD-10-CM | POA: Diagnosis not present

## 2022-04-23 DIAGNOSIS — I509 Heart failure, unspecified: Secondary | ICD-10-CM | POA: Diagnosis not present

## 2022-04-23 DIAGNOSIS — J9610 Chronic respiratory failure, unspecified whether with hypoxia or hypercapnia: Secondary | ICD-10-CM | POA: Diagnosis not present

## 2022-04-23 DIAGNOSIS — I130 Hypertensive heart and chronic kidney disease with heart failure and stage 1 through stage 4 chronic kidney disease, or unspecified chronic kidney disease: Secondary | ICD-10-CM | POA: Diagnosis not present

## 2022-04-26 DIAGNOSIS — I509 Heart failure, unspecified: Secondary | ICD-10-CM | POA: Diagnosis not present

## 2022-04-26 DIAGNOSIS — N1832 Chronic kidney disease, stage 3b: Secondary | ICD-10-CM | POA: Diagnosis not present

## 2022-04-26 DIAGNOSIS — I130 Hypertensive heart and chronic kidney disease with heart failure and stage 1 through stage 4 chronic kidney disease, or unspecified chronic kidney disease: Secondary | ICD-10-CM | POA: Diagnosis not present

## 2022-04-26 DIAGNOSIS — J9610 Chronic respiratory failure, unspecified whether with hypoxia or hypercapnia: Secondary | ICD-10-CM | POA: Diagnosis not present

## 2022-04-26 DIAGNOSIS — I4891 Unspecified atrial fibrillation: Secondary | ICD-10-CM | POA: Diagnosis not present

## 2022-04-26 DIAGNOSIS — F0390 Unspecified dementia without behavioral disturbance: Secondary | ICD-10-CM | POA: Diagnosis not present

## 2022-04-27 ENCOUNTER — Ambulatory Visit (INDEPENDENT_AMBULATORY_CARE_PROVIDER_SITE_OTHER): Payer: Medicare Other

## 2022-04-27 DIAGNOSIS — Z0000 Encounter for general adult medical examination without abnormal findings: Secondary | ICD-10-CM

## 2022-04-27 NOTE — Progress Notes (Signed)
I connected with  Jeanella Flattery on 04/27/22 by a audio enabled telemedicine application and verified that I am speaking with the correct person using two identifiers.Spoke w/ niece.   Patient Location: Home  Provider Location: Office/Clinic  I discussed the limitations of evaluation and management by telemedicine. The patient expressed understanding and agreed to proceed.  Subjective:   Leslie Johnston is a 89 y.o. female who presents for Medicare Annual (Subsequent) preventive examination.  Review of Systems     Cardiac Risk Factors include: hypertension;dyslipidemia     Objective:    There were no vitals filed for this visit. There is no height or weight on file to calculate BMI.     04/27/2022    2:27 PM 01/24/2022    7:36 PM 08/14/2021    7:34 PM 03/24/2021    3:28 PM 05/15/2020   10:36 PM 05/15/2020    4:59 PM 02/28/2020    6:45 PM  Advanced Directives  Does Patient Have a Medical Advance Directive? Yes No No Yes Yes No No  Type of Estate agent of [place];Living will   Healthcare Power of [place];Living will Healthcare Power of Attorney    Does patient want to make changes to medical advance directive? No - Patient declined   Yes (Inpatient - patient defers changing a medical advance directive and declines information at this time) No - Patient declined    Copy of Healthcare Power of Attorney in Chart? Yes - validated most recent copy scanned in chart (See row information)   Yes - validated most recent copy scanned in chart (See row information)       Current Medications (verified) Outpatient Encounter Medications as of 04/27/2022  Medication Sig   acetaminophen (TYLENOL) 650 MG CR tablet Take 650 mg by mouth every 8 (eight) hours as needed for pain.   albuterol (VENTOLIN HFA) 108 (90 Base) MCG/ACT inhaler INHALE 1 TO 2 PUFFS INTO THE LUNGS EVERY 6 HOURS AS NEEDED FOR WHEEZING OR SHORTNESS OF BREATH   amiodarone (PACERONE) 200 MG tablet Take 1  tablet (200 mg total) by mouth daily.   apixaban (ELIQUIS) 2.5 MG TABS tablet TAKE 1 TABLET(2.5 MG) BY MOUTH TWICE DAILY   diphenhydrAMINE (BENADRYL) 25 MG tablet Take 1 tablet (25 mg total) by mouth every 8 (eight) hours as needed for itching.   furosemide (LASIX) 20 MG tablet Take 2 tablets (40 mg total) by mouth 2 (two) times daily.   LORazepam (ATIVAN) 0.5 MG tablet Take 0.5 mg by mouth every 8 (eight) hours.   metoprolol tartrate (LOPRESSOR) 100 MG tablet Take 1 tablet (100 mg total) by mouth 2 (two) times daily.   mirtazapine (REMERON) 15 MG tablet Take 1 tablet (15 mg total) by mouth at bedtime.   Multiple Vitamin (MULTIVITAMIN WITH MINERALS) TABS tablet Take 1 tablet by mouth daily with lunch.   Multiple Vitamins-Minerals (PRESERVISION AREDS 2+MULTI VIT PO) Take 1 tablet by mouth daily.   pantoprazole (PROTONIX) 40 MG tablet TAKE 1 TABLET BY MOUTH DAILY BEFORE BREAKFAST   cetirizine (ZYRTEC) 10 MG tablet Take 10 mg by mouth daily. (Patient not taking: Reported on 02/07/2022)   No facility-administered encounter medications on file as of 04/27/2022.    Allergies (verified) Amlodipine, Losartan, Penicillins, and Tramadol   History: Past Medical History:  Diagnosis Date   A-fib (HCC)    Allergic rhinitis    Allergy    Chronic low back pain    GERD (gastroesophageal reflux disease)  Hypertension    Past Surgical History:  Procedure Laterality Date   EYE SURGERY     cataract extraction- Right   KYPHOPLASTY N/A 11/26/2017   Procedure: [id number];  Surgeon: Kennedy Bucker, MD;  Location: [facility] ORS;  Service: Orthopedics;  Laterality: N/A;   Family History  Problem Relation Age of Onset   Pancreatic cancer Sister    Arthritis Maternal Grandmother    Leukemia Maternal Grandfather    Asthma Maternal Grandfather    Breast cancer Daughter    Bladder Cancer Daughter    Social History   Socioeconomic History   Marital status: Widowed    Spouse name: Not on file    Number of children: Not on file   Years of education: Not on file   Highest education level: Not on file  Occupational History   Not on file  Tobacco Use   Smoking status: Never   Smokeless tobacco: Never  Vaping Use   Vaping Use: Never used  Substance and Sexual Activity   Alcohol use: No    Alcohol/week: 0.0 standard drinks of alcohol   Drug use: No   Sexual activity: Not on file  Other Topics Concern   Not on file  Social History Narrative   Not on file   Social Determinants of Health   Financial Resource Strain: Low Risk  (04/27/2022)   Overall Financial Resource Strain (CARDIA)    Difficulty of Paying Living Expenses: Not hard at all  Food Insecurity: No Food Insecurity (04/27/2022)   Hunger Vital Sign    Worried About Running Out of Food in the Last Year: Never true    Ran Out of Food in the Last Year: Never true  Transportation Needs: No Transportation Needs (04/27/2022)   PRAPARE - Administrator, Civil Service (Medical): No    Lack of Transportation (Non-Medical): No  Physical Activity: Inactive (04/27/2022)   Exercise Vital Sign    Days of Exercise per Week: 0 days    Minutes of Exercise per Session: 0 min  Stress: No Stress Concern Present (04/27/2022)   [employer] of Occupational Health - Occupational Stress Questionnaire    Feeling of Stress : Not at all  Social Connections: Socially Isolated (04/27/2022)   Social Connection and Isolation Panel [NHANES]    Frequency of Communication with Friends and Family: More than three times a week    Frequency of Social Gatherings with Friends and Family: More than three times a week    Attends Religious Services: Never    Database administrator or Organizations: No    Attends Banker Meetings: Never    Marital Status: Widowed    Tobacco Counseling Counseling given: Not Answered   Clinical Intake:  Pre-visit preparation completed: Yes  Pain : No/denies pain     Nutritional Risks:  None Diabetes: No  How often do you need to have someone help you when you read instructions, pamphlets, or other written materials from your doctor or pharmacy?: 1 - Never  Diabetic?NO  Interpreter Needed?: No  Information entered by :: Kynzlie Hilleary LPN   Activities of Daily Living    04/27/2022    2:29 PM 04/27/2022    1:55 PM  In your present state of health, do you have any difficulty performing the following activities:  Hearing? 1 1  Vision? 1 1  Difficulty concentrating or making decisions? 1 1  Walking or climbing stairs? 1 1  Dressing or bathing? 1 1  Doing errands,  shopping? 1 1  Preparing Food and eating ? Y Y  Using the Toilet? N N  In the past six months, have you accidently leaked urine? Y Y  Do you have problems with loss of bowel control? Y Y  Managing your Medications? Y Y  Managing your Finances? Malvin Johns  Housekeeping or managing your Housekeeping? Malvin Johns    Patient Care Team: Smitty Cords, DO as PCP - General (Family Medicine) Antonieta Iba, MD as PCP - Cardiology (Cardiology)  Indicate any recent Medical Services you may have received from other than [facility] providers in the past year (date may be approximate).     Assessment:   This is a routine wellness examination for Tynika.  Hearing/Vision screen Hearing Screening - Comments:: WEARS AIDS Vision Screening - Comments:: NO GLASSES- DR.PORFILIO  Dietary issues and exercise activities discussed: Current Exercise Habits: The patient does not participate in regular exercise at present   Goals Addressed             This Visit's Progress    DIET - EAT MORE FRUITS AND VEGETABLES         Depression Screen    04/27/2022    2:25 PM 09/13/2021    4:40 PM 03/24/2021    3:27 PM 09/12/2020    2:27 PM 04/15/2019    2:48 PM 04/15/2019    2:19 PM 08/26/2018    2:33 PM  PHQ 2/9 Scores  PHQ - 2 Score 1 1 1 2  0 0 0  PHQ- 9 Score 4 6  7  0      Fall Risk    04/27/2022    2:28 PM 04/27/2022     1:55 PM 09/13/2021    4:40 PM 03/24/2021    3:30 PM 09/12/2020    2:27 PM  Fall Risk   Falls in the past year? 1 1 0 0 0  Number falls in past yr: 0 0 0 0 0  Injury with Fall? 0 1 0 0 0  Risk for fall due to : History of fall(s)  History of fall(s) No Fall Risks   Follow up Falls evaluation completed;Falls prevention discussed  Falls evaluation completed Falls evaluation completed Falls evaluation completed    FALL RISK PREVENTION PERTAINING TO THE HOME:  Any stairs in or around the home? Yes  If so, are there any without handrails? No  Home free of loose throw rugs in walkways, pet beds, electrical cords, etc? Yes  Adequate lighting in your home to reduce risk of falls? Yes   ASSISTIVE DEVICES UTILIZED TO PREVENT FALLS:  Life alert? No  Use of a cane, walker or w/c? Yes - WALKER  Grab bars in the bathroom? Yes  Shower chair or bench in shower? Yes  Elevated toilet seat or a handicapped toilet? Yes    Cognitive Function:PT NOT AVAILABLE FOR TESTING 2024      07/27/2014    3:25 PM  MMSE - Mini Mental State Exam  Orientation to time 5  Orientation to Place 5  Registration 3  Attention/ Calculation 5  Recall 3  Language- name 2 objects 2  Language- repeat 1  Language- follow 3 step command 3  Language- read & follow direction 1  Write a sentence 1  Copy design 1  Total score 30        07/09/2017   11:31 AM 06/19/2016   10:25 AM  6CIT Screen  What Year? 0 points 0 points  What month? 0  points 0 points  What time? 0 points 0 points  Count back from 20 0 points 0 points  Months in reverse 0 points 0 points  Repeat phrase 2 points 0 points  Total Score 2 points 0 points    Immunizations Immunization History  Administered Date(s) Administered   PFIZER Comirnaty(Gray Top)Covid-19 Tri-Sucrose Vaccine 07/24/2019, 08/14/2019   Pneumococcal Conjugate-13 04/25/2013   Pneumococcal Polysaccharide-23 06/19/2016    TDAP status: Due, Education has been provided  regarding the importance of this vaccine. Advised may receive this vaccine at local pharmacy or Health Dept. Aware to provide a copy of the vaccination record if obtained from local pharmacy or Health Dept. Verbalized acceptance and understanding.  Flu Vaccine status: Declined, Education has been provided regarding the importance of this vaccine but patient still declined. Advised may receive this vaccine at local pharmacy or Health Dept. Aware to provide a copy of the vaccination record if obtained from local pharmacy or Health Dept. Verbalized acceptance and understanding.  Pneumococcal vaccine status: Up to date  Covid-19 vaccine status: Completed vaccines  Qualifies for Shingles Vaccine? Yes   Zostavax completed No   Shingrix Completed?: No.    Education has been provided regarding the importance of this vaccine. Patient has been advised to call insurance company to determine out of pocket expense if they have not yet received this vaccine. Advised may also receive vaccine at local pharmacy or Health Dept. Verbalized acceptance and understanding.  Screening Tests Health Maintenance  Topic Date Due   DTaP/Tdap/Td (1 - Tdap) Never done   Zoster Vaccines- Shingrix (1 of 2) Never done   COVID-19 Vaccine (3 - 2023-24 season) 09/01/2021   INFLUENZA VACCINE  04/01/2023 (Originally 08/02/2022)   Pneumonia Vaccine 61+ Years old  Completed   DEXA SCAN  Completed   HPV VACCINES  Aged Out    Health Maintenance  Health Maintenance Due  Topic Date Due   DTaP/Tdap/Td (1 - Tdap) Never done   Zoster Vaccines- Shingrix (1 of 2) Never done   COVID-19 Vaccine (3 - 2023-24 season) 09/01/2021    Colorectal cancer screening: No longer required.   Mammogram status: No longer required due to AGE.   Lung Cancer Screening: (Low Dose CT Chest recommended if Age 77-80 years, 30 pack-year currently smoking OR have quit w/in 15years.) does not qualify.    Additional Screening:  Hepatitis C Screening:  does not qualify; Completed NO  Vision Screening: Recommended annual ophthalmology exams for early detection of glaucoma and other disorders of the eye. Is the patient up to date with their annual eye exam?  Yes  Who is the provider or what is the name of the office in which the patient attends annual eye exams? DR.PORFILIO If pt is not established with a provider, would they like to be referred to a provider to establish care? No .   Dental Screening: Recommended annual dental exams for proper oral hygiene  Community Resource Referral / Chronic Care Management: CRR required this visit?  No   CCM required this visit?  No      Plan:     I have personally reviewed and noted the following in the patient's chart:   Medical and social history Use of alcohol, tobacco or illicit drugs  Current medications and supplements including opioid prescriptions. Patient is not currently taking opioid prescriptions. Functional ability and status Nutritional status Physical activity Advanced directives List of other physicians Hospitalizations, surgeries, and ER visits in previous 12 months Vitals Screenings to  include cognitive, depression, and falls Referrals and appointments  In addition, I have reviewed and discussed with patient certain preventive protocols, quality metrics, and best practice recommendations. A written personalized care plan for preventive services as well as general preventive health recommendations were provided to patient.     Hal Hope, LPN   [id number]/61/0960   Nurse Notes: [facility]

## 2022-04-27 NOTE — Patient Instructions (Signed)
Leslie Johnston , Thank you for taking time to come for your Medicare Wellness Visit. I appreciate your ongoing commitment to your health goals. Please review the following plan we discussed and let me know if I can assist you in the future.   These are the goals we discussed:  Goals      Develop Plan of care for Management of CHF     Care Coordination Interventions: Basic overview and discussion of pathophysiology of Heart Failure reviewed Provided education on low sodium diet Reviewed Heart Failure Action Plan in depth and provided written copy Assessed need for readable accurate scales in home Advised patient to weigh each morning after emptying bladder Discussed importance of daily weight and advised patient to weigh and record daily Reviewed role of diuretics in prevention of fluid overload and management of heart failure; Discussed the importance of keeping all appointments with provider Screening for signs and symptoms of depression related to chronic disease state  Assessed social determinant of health barriers  Request made to PCP for home health RN for home labs as well as for possible palliative care referral. Patient was active in the past with palliative, case ended on 11/21/21.  Confirmed patient has [facility] already active for PT/OT sessions.  Discussed medication management, especially Lasix, 20mg  twice a day for fluid management. Discussed breathing techniques/exercises as daughter report patient's oxygen levels decrease with slight activity.  Decreases down to 60s at times, increases with deep breathing and use of incentive spirometry  Update 2/13 - Case closed, patient now on hospice      DIET - EAT MORE FRUITS AND VEGETABLES     DIET - EAT MORE FRUITS AND VEGETABLES     DIET - INCREASE WATER INTAKE     Recommend drinking at least 6-8 glasses of water a day      Family to eat at dinner table more often     Pt would like some help cleaning dishes when family arrives for  family meals.      Increase water intake     Recommend drinking at least 4-5 glasses of water a day         This is a list of the screening recommended for you and due dates:  Health Maintenance  Topic Date Due   DTaP/Tdap/Td vaccine (1 - Tdap) Never done   Zoster (Shingles) Vaccine (1 of 2) Never done   COVID-19 Vaccine (3 - 2023-24 season) 09/01/2021   Flu Shot  04/01/2023*   Pneumonia Vaccine  Completed   DEXA scan (bone density measurement)  Completed   HPV Vaccine  Aged Out  *Topic was postponed. The date shown is not the original due date.    Advanced directives: yes  Conditions/risks identified: none  Next appointment: Follow up in one year for your annual wellness visit 05/03/23 @ 2:00 pm by phone   Preventive Care 65 Years and Older, Female Preventive care refers to lifestyle choices and visits with your health care provider that can promote health and wellness. What does preventive care include? A yearly physical exam. This is also called an annual well check. Dental exams once or twice a year. Routine eye exams. Ask your health care provider how often you should have your eyes checked. Personal lifestyle choices, including: Daily care of your teeth and gums. Regular physical activity. Eating a healthy diet. Avoiding tobacco and drug use. Limiting alcohol use. Practicing safe sex. Taking low-dose aspirin every day. Taking vitamin and mineral supplements as recommended  by your health care provider. What happens during an annual well check? The services and screenings done by your health care provider during your annual well check will depend on your age, overall health, lifestyle risk factors, and family history of disease. Counseling  Your health care provider may ask you questions about your: Alcohol use. Tobacco use. Drug use. Emotional well-being. Home and relationship well-being. Sexual activity. Eating habits. History of falls. Memory and ability to  understand (cognition). Work and work Astronomer. Reproductive health. Screening  You may have the following tests or measurements: Height, weight, and BMI. Blood pressure. Lipid and cholesterol levels. These may be checked every 5 years, or more frequently if you are over 72 years old. Skin check. Lung cancer screening. You may have this screening every year starting at age 63 if you have a 30-pack-year history of smoking and currently smoke or have quit within the past 15 years. Fecal occult blood test (FOBT) of the stool. You may have this test every year starting at age 62. Flexible sigmoidoscopy or colonoscopy. You may have a sigmoidoscopy every 5 years or a colonoscopy every 10 years starting at age 50. Hepatitis C blood test. Hepatitis B blood test. Sexually transmitted disease (STD) testing. Diabetes screening. This is done by checking your blood sugar (glucose) after you have not eaten for a while (fasting). You may have this done every 1-3 years. Bone density scan. This is done to screen for osteoporosis. You may have this done starting at age 76. Mammogram. This may be done every 1-2 years. Talk to your health care provider about how often you should have regular mammograms. Talk with your health care provider about your test results, treatment options, and if necessary, the need for more tests. Vaccines  Your health care provider may recommend certain vaccines, such as: Influenza vaccine. This is recommended every year. Tetanus, diphtheria, and acellular pertussis (Tdap, Td) vaccine. You may need a Td booster every 10 years. Zoster vaccine. You may need this after age 14. Pneumococcal 13-valent conjugate (PCV13) vaccine. One dose is recommended after age 41. Pneumococcal polysaccharide (PPSV23) vaccine. One dose is recommended after age 43. Talk to your health care provider about which screenings and vaccines you need and how often you need them. This information is not  intended to replace advice given to you by your health care provider. Make sure you discuss any questions you have with your health care provider. Document Released: 01/14/2015 Document Revised: 09/07/2015 Document Reviewed: 10/19/2014 Elsevier Interactive Patient Education  2017 [employer].  Fall Prevention in the Home Falls can cause injuries. They can happen to people of all ages. There are many things you can do to make your home safe and to help prevent falls. What can I do on the outside of my home? Regularly fix the edges of walkways and driveways and fix any cracks. Remove anything that might make you trip as you walk through a door, such as a raised step or threshold. Trim any bushes or trees on the path to your home. Use bright outdoor lighting. Clear any walking paths of anything that might make someone trip, such as rocks or tools. Regularly check to see if handrails are loose or broken. Make sure that both sides of any steps have handrails. Any raised decks and porches should have guardrails on the edges. Have any leaves, snow, or ice cleared regularly. Use sand or salt on walking paths during winter. Clean up any spills in your garage right away.  This includes oil or grease spills. What can I do in the bathroom? Use night lights. Install grab bars by the toilet and in the tub and shower. Do not use towel bars as grab bars. Use non-skid mats or decals in the tub or shower. If you need to sit down in the shower, use a plastic, non-slip stool. Keep the floor dry. Clean up any water that spills on the floor as soon as it happens. Remove soap buildup in the tub or shower regularly. Attach bath mats securely with double-sided non-slip rug tape. Do not have throw rugs and other things on the floor that can make you trip. What can I do in the bedroom? Use night lights. Make sure that you have a light by your bed that is easy to reach. Do not use any sheets or blankets that are  too big for your bed. They should not hang down onto the floor. Have a firm chair that has side arms. You can use this for support while you get dressed. Do not have throw rugs and other things on the floor that can make you trip. What can I do in the kitchen? Clean up any spills right away. Avoid walking on wet floors. Keep items that you use a lot in easy-to-reach places. If you need to reach something above you, use a strong step stool that has a grab bar. Keep electrical cords out of the way. Do not use floor polish or wax that makes floors slippery. If you must use wax, use non-skid floor wax. Do not have throw rugs and other things on the floor that can make you trip. What can I do with my stairs? Do not leave any items on the stairs. Make sure that there are handrails on both sides of the stairs and use them. Fix handrails that are broken or loose. Make sure that handrails are as long as the stairways. Check any carpeting to make sure that it is firmly attached to the stairs. Fix any carpet that is loose or worn. Avoid having throw rugs at the top or bottom of the stairs. If you do have throw rugs, attach them to the floor with carpet tape. Make sure that you have a light switch at the top of the stairs and the bottom of the stairs. If you do not have them, ask someone to add them for you. What else can I do to help prevent falls? Wear shoes that: Do not have high heels. Have rubber bottoms. Are comfortable and fit you well. Are closed at the toe. Do not wear sandals. If you use a stepladder: Make sure that it is fully opened. Do not climb a closed stepladder. Make sure that both sides of the stepladder are locked into place. Ask someone to hold it for you, if possible. Clearly mark and make sure that you can see: Any grab bars or handrails. First and last steps. Where the edge of each step is. Use tools that help you move around (mobility aids) if they are needed. These  include: Canes. Walkers. Scooters. Crutches. Turn on the lights when you go into a dark area. Replace any light bulbs as soon as they burn out. Set up your furniture so you have a clear path. Avoid moving your furniture around. If any of your floors are uneven, fix them. If there are any pets around you, be aware of where they are. Review your medicines with your doctor. Some medicines can make you feel  dizzy. This can increase your chance of falling. Ask your doctor what other things that you can do to help prevent falls. This information is not intended to replace advice given to you by your health care provider. Make sure you discuss any questions you have with your health care provider. Document Released: 10/14/2008 Document Revised: 05/26/2015 Document Reviewed: 01/22/2014 Elsevier Interactive Patient Education  2017 [employer].

## 2022-05-01 DIAGNOSIS — I130 Hypertensive heart and chronic kidney disease with heart failure and stage 1 through stage 4 chronic kidney disease, or unspecified chronic kidney disease: Secondary | ICD-10-CM | POA: Diagnosis not present

## 2022-05-01 DIAGNOSIS — I509 Heart failure, unspecified: Secondary | ICD-10-CM | POA: Diagnosis not present

## 2022-05-01 DIAGNOSIS — J9610 Chronic respiratory failure, unspecified whether with hypoxia or hypercapnia: Secondary | ICD-10-CM | POA: Diagnosis not present

## 2022-05-01 DIAGNOSIS — N1832 Chronic kidney disease, stage 3b: Secondary | ICD-10-CM | POA: Diagnosis not present

## 2022-05-01 DIAGNOSIS — F0390 Unspecified dementia without behavioral disturbance: Secondary | ICD-10-CM | POA: Diagnosis not present

## 2022-05-01 DIAGNOSIS — I4891 Unspecified atrial fibrillation: Secondary | ICD-10-CM | POA: Diagnosis not present

## 2022-05-02 DIAGNOSIS — K219 Gastro-esophageal reflux disease without esophagitis: Secondary | ICD-10-CM | POA: Diagnosis not present

## 2022-05-02 DIAGNOSIS — I130 Hypertensive heart and chronic kidney disease with heart failure and stage 1 through stage 4 chronic kidney disease, or unspecified chronic kidney disease: Secondary | ICD-10-CM | POA: Diagnosis not present

## 2022-05-02 DIAGNOSIS — J9610 Chronic respiratory failure, unspecified whether with hypoxia or hypercapnia: Secondary | ICD-10-CM | POA: Diagnosis not present

## 2022-05-02 DIAGNOSIS — F0390 Unspecified dementia without behavioral disturbance: Secondary | ICD-10-CM | POA: Diagnosis not present

## 2022-05-02 DIAGNOSIS — I4891 Unspecified atrial fibrillation: Secondary | ICD-10-CM | POA: Diagnosis not present

## 2022-05-02 DIAGNOSIS — N1832 Chronic kidney disease, stage 3b: Secondary | ICD-10-CM | POA: Diagnosis not present

## 2022-05-02 DIAGNOSIS — M810 Age-related osteoporosis without current pathological fracture: Secondary | ICD-10-CM | POA: Diagnosis not present

## 2022-05-02 DIAGNOSIS — I509 Heart failure, unspecified: Secondary | ICD-10-CM | POA: Diagnosis not present

## 2022-05-02 DIAGNOSIS — J302 Other seasonal allergic rhinitis: Secondary | ICD-10-CM | POA: Diagnosis not present

## 2022-05-04 DIAGNOSIS — I130 Hypertensive heart and chronic kidney disease with heart failure and stage 1 through stage 4 chronic kidney disease, or unspecified chronic kidney disease: Secondary | ICD-10-CM | POA: Diagnosis not present

## 2022-05-04 DIAGNOSIS — N1832 Chronic kidney disease, stage 3b: Secondary | ICD-10-CM | POA: Diagnosis not present

## 2022-05-04 DIAGNOSIS — I4891 Unspecified atrial fibrillation: Secondary | ICD-10-CM | POA: Diagnosis not present

## 2022-05-04 DIAGNOSIS — I509 Heart failure, unspecified: Secondary | ICD-10-CM | POA: Diagnosis not present

## 2022-05-04 DIAGNOSIS — F0390 Unspecified dementia without behavioral disturbance: Secondary | ICD-10-CM | POA: Diagnosis not present

## 2022-05-04 DIAGNOSIS — J9610 Chronic respiratory failure, unspecified whether with hypoxia or hypercapnia: Secondary | ICD-10-CM | POA: Diagnosis not present

## 2022-05-06 DIAGNOSIS — F0390 Unspecified dementia without behavioral disturbance: Secondary | ICD-10-CM | POA: Diagnosis not present

## 2022-05-06 DIAGNOSIS — I509 Heart failure, unspecified: Secondary | ICD-10-CM | POA: Diagnosis not present

## 2022-05-06 DIAGNOSIS — I4891 Unspecified atrial fibrillation: Secondary | ICD-10-CM | POA: Diagnosis not present

## 2022-05-06 DIAGNOSIS — I130 Hypertensive heart and chronic kidney disease with heart failure and stage 1 through stage 4 chronic kidney disease, or unspecified chronic kidney disease: Secondary | ICD-10-CM | POA: Diagnosis not present

## 2022-05-06 DIAGNOSIS — J9610 Chronic respiratory failure, unspecified whether with hypoxia or hypercapnia: Secondary | ICD-10-CM | POA: Diagnosis not present

## 2022-05-06 DIAGNOSIS — N1832 Chronic kidney disease, stage 3b: Secondary | ICD-10-CM | POA: Diagnosis not present

## 2022-05-07 DIAGNOSIS — I509 Heart failure, unspecified: Secondary | ICD-10-CM | POA: Diagnosis not present

## 2022-05-07 DIAGNOSIS — J9610 Chronic respiratory failure, unspecified whether with hypoxia or hypercapnia: Secondary | ICD-10-CM | POA: Diagnosis not present

## 2022-05-07 DIAGNOSIS — I4891 Unspecified atrial fibrillation: Secondary | ICD-10-CM | POA: Diagnosis not present

## 2022-05-07 DIAGNOSIS — F0390 Unspecified dementia without behavioral disturbance: Secondary | ICD-10-CM | POA: Diagnosis not present

## 2022-05-07 DIAGNOSIS — N1832 Chronic kidney disease, stage 3b: Secondary | ICD-10-CM | POA: Diagnosis not present

## 2022-05-07 DIAGNOSIS — I130 Hypertensive heart and chronic kidney disease with heart failure and stage 1 through stage 4 chronic kidney disease, or unspecified chronic kidney disease: Secondary | ICD-10-CM | POA: Diagnosis not present

## 2022-05-09 DIAGNOSIS — I509 Heart failure, unspecified: Secondary | ICD-10-CM | POA: Diagnosis not present

## 2022-05-09 DIAGNOSIS — I130 Hypertensive heart and chronic kidney disease with heart failure and stage 1 through stage 4 chronic kidney disease, or unspecified chronic kidney disease: Secondary | ICD-10-CM | POA: Diagnosis not present

## 2022-05-09 DIAGNOSIS — F0390 Unspecified dementia without behavioral disturbance: Secondary | ICD-10-CM | POA: Diagnosis not present

## 2022-05-09 DIAGNOSIS — I4891 Unspecified atrial fibrillation: Secondary | ICD-10-CM | POA: Diagnosis not present

## 2022-05-09 DIAGNOSIS — Z743 Need for continuous supervision: Secondary | ICD-10-CM | POA: Diagnosis not present

## 2022-05-09 DIAGNOSIS — R404 Transient alteration of awareness: Secondary | ICD-10-CM | POA: Diagnosis not present

## 2022-05-09 DIAGNOSIS — J9610 Chronic respiratory failure, unspecified whether with hypoxia or hypercapnia: Secondary | ICD-10-CM | POA: Diagnosis not present

## 2022-05-09 DIAGNOSIS — N1832 Chronic kidney disease, stage 3b: Secondary | ICD-10-CM | POA: Diagnosis not present

## 2022-05-10 DIAGNOSIS — J9610 Chronic respiratory failure, unspecified whether with hypoxia or hypercapnia: Secondary | ICD-10-CM | POA: Diagnosis not present

## 2022-05-10 DIAGNOSIS — I130 Hypertensive heart and chronic kidney disease with heart failure and stage 1 through stage 4 chronic kidney disease, or unspecified chronic kidney disease: Secondary | ICD-10-CM | POA: Diagnosis not present

## 2022-05-10 DIAGNOSIS — N1832 Chronic kidney disease, stage 3b: Secondary | ICD-10-CM | POA: Diagnosis not present

## 2022-05-10 DIAGNOSIS — F0390 Unspecified dementia without behavioral disturbance: Secondary | ICD-10-CM | POA: Diagnosis not present

## 2022-05-10 DIAGNOSIS — I4891 Unspecified atrial fibrillation: Secondary | ICD-10-CM | POA: Diagnosis not present

## 2022-05-10 DIAGNOSIS — I509 Heart failure, unspecified: Secondary | ICD-10-CM | POA: Diagnosis not present

## 2022-05-11 DIAGNOSIS — I4891 Unspecified atrial fibrillation: Secondary | ICD-10-CM | POA: Diagnosis not present

## 2022-05-11 DIAGNOSIS — J9610 Chronic respiratory failure, unspecified whether with hypoxia or hypercapnia: Secondary | ICD-10-CM | POA: Diagnosis not present

## 2022-05-11 DIAGNOSIS — F0390 Unspecified dementia without behavioral disturbance: Secondary | ICD-10-CM | POA: Diagnosis not present

## 2022-05-11 DIAGNOSIS — N1832 Chronic kidney disease, stage 3b: Secondary | ICD-10-CM | POA: Diagnosis not present

## 2022-05-11 DIAGNOSIS — I509 Heart failure, unspecified: Secondary | ICD-10-CM | POA: Diagnosis not present

## 2022-05-11 DIAGNOSIS — I130 Hypertensive heart and chronic kidney disease with heart failure and stage 1 through stage 4 chronic kidney disease, or unspecified chronic kidney disease: Secondary | ICD-10-CM | POA: Diagnosis not present

## 2022-06-02 DEATH — deceased
